# Patient Record
Sex: Female | Born: 1935 | Race: White | Hispanic: No | Marital: Married | Smoking: Never smoker
Health system: Southern US, Community
[De-identification: ages and names within clinical notes are randomized; demographics above are authoritative.]

## PROBLEM LIST (undated history)

## (undated) DIAGNOSIS — G629 Polyneuropathy, unspecified: Secondary | ICD-10-CM

## (undated) DIAGNOSIS — M1990 Unspecified osteoarthritis, unspecified site: Secondary | ICD-10-CM

## (undated) DIAGNOSIS — I340 Nonrheumatic mitral (valve) insufficiency: Secondary | ICD-10-CM

## (undated) DIAGNOSIS — K5790 Diverticulosis of intestine, part unspecified, without perforation or abscess without bleeding: Secondary | ICD-10-CM

## (undated) DIAGNOSIS — I509 Heart failure, unspecified: Secondary | ICD-10-CM

## (undated) DIAGNOSIS — I611 Nontraumatic intracerebral hemorrhage in hemisphere, cortical: Secondary | ICD-10-CM

## (undated) DIAGNOSIS — M810 Age-related osteoporosis without current pathological fracture: Secondary | ICD-10-CM

## (undated) DIAGNOSIS — I4891 Unspecified atrial fibrillation: Secondary | ICD-10-CM

## (undated) DIAGNOSIS — Q211 Atrial septal defect, unspecified: Secondary | ICD-10-CM

## (undated) HISTORY — PX: OTHER SURGICAL HISTORY: SHX169

## (undated) HISTORY — PX: BREAST CYST EXCISION: SHX579

## (undated) HISTORY — PX: SHOULDER SURGERY: SHX246

---

## 2003-11-05 ENCOUNTER — Other Ambulatory Visit: Payer: Self-pay

## 2004-05-20 ENCOUNTER — Encounter: Payer: Self-pay | Admitting: Unknown Physician Specialty

## 2004-06-15 ENCOUNTER — Encounter: Payer: Self-pay | Admitting: Unknown Physician Specialty

## 2004-10-14 ENCOUNTER — Ambulatory Visit: Payer: Self-pay | Admitting: Unknown Physician Specialty

## 2004-10-20 ENCOUNTER — Ambulatory Visit: Payer: Self-pay | Admitting: Unknown Physician Specialty

## 2005-04-27 ENCOUNTER — Ambulatory Visit: Payer: Self-pay | Admitting: Unknown Physician Specialty

## 2005-12-07 ENCOUNTER — Ambulatory Visit: Payer: Self-pay | Admitting: Unknown Physician Specialty

## 2007-01-08 ENCOUNTER — Ambulatory Visit: Payer: Self-pay | Admitting: Unknown Physician Specialty

## 2007-12-25 ENCOUNTER — Ambulatory Visit: Payer: Self-pay | Admitting: Unknown Physician Specialty

## 2008-01-10 ENCOUNTER — Ambulatory Visit: Payer: Self-pay | Admitting: Unknown Physician Specialty

## 2008-01-15 ENCOUNTER — Ambulatory Visit: Payer: Self-pay | Admitting: Unknown Physician Specialty

## 2009-11-10 ENCOUNTER — Inpatient Hospital Stay: Payer: Self-pay | Admitting: Unknown Physician Specialty

## 2009-11-13 ENCOUNTER — Encounter: Payer: Self-pay | Admitting: Internal Medicine

## 2010-12-03 ENCOUNTER — Ambulatory Visit: Payer: Self-pay | Admitting: Unknown Physician Specialty

## 2011-08-16 HISTORY — PX: HIP FRACTURE SURGERY: SHX118

## 2012-10-01 ENCOUNTER — Inpatient Hospital Stay: Payer: Self-pay | Admitting: Orthopedic Surgery

## 2012-10-01 LAB — COMPREHENSIVE METABOLIC PANEL
Alkaline Phosphatase: 64 U/L (ref 50–136)
BUN: 23 mg/dL — ABNORMAL HIGH (ref 7–18)
Bilirubin,Total: 0.4 mg/dL (ref 0.2–1.0)
Calcium, Total: 8.8 mg/dL (ref 8.5–10.1)
Chloride: 108 mmol/L — ABNORMAL HIGH (ref 98–107)
Potassium: 3.9 mmol/L (ref 3.5–5.1)
SGPT (ALT): 32 U/L (ref 12–78)
Sodium: 143 mmol/L (ref 136–145)

## 2012-10-01 LAB — CBC WITH DIFFERENTIAL/PLATELET
Basophil %: 0.6 %
Eosinophil #: 0.1 10*3/uL (ref 0.0–0.7)
Eosinophil %: 1 %
HCT: 37.2 % (ref 35.0–47.0)
HGB: 12.1 g/dL (ref 12.0–16.0)
Lymphocyte #: 0.9 10*3/uL — ABNORMAL LOW (ref 1.0–3.6)
Lymphocyte %: 12.5 %
MCHC: 32.6 g/dL (ref 32.0–36.0)
Monocyte %: 6 %
Neutrophil #: 5.8 10*3/uL (ref 1.4–6.5)
Neutrophil %: 79.9 %
Platelet: 180 10*3/uL (ref 150–440)
RBC: 3.97 10*6/uL (ref 3.80–5.20)
RDW: 14.8 % — ABNORMAL HIGH (ref 11.5–14.5)

## 2012-10-01 LAB — TSH: Thyroid Stimulating Horm: 1.92 u[IU]/mL

## 2012-10-02 LAB — BASIC METABOLIC PANEL
Anion Gap: 5 — ABNORMAL LOW (ref 7–16)
BUN: 20 mg/dL — ABNORMAL HIGH (ref 7–18)
Chloride: 108 mmol/L — ABNORMAL HIGH (ref 98–107)
Co2: 28 mmol/L (ref 21–32)
Creatinine: 0.73 mg/dL (ref 0.60–1.30)
Glucose: 112 mg/dL — ABNORMAL HIGH (ref 65–99)

## 2012-10-02 LAB — CBC WITH DIFFERENTIAL/PLATELET
Eosinophil #: 0 10*3/uL (ref 0.0–0.7)
Lymphocyte #: 1 10*3/uL (ref 1.0–3.6)
Lymphocyte %: 17 %
Monocyte #: 0.5 x10 3/mm (ref 0.2–0.9)
Monocyte %: 8.2 %
Neutrophil #: 4.3 10*3/uL (ref 1.4–6.5)
RBC: 3.31 10*6/uL — ABNORMAL LOW (ref 3.80–5.20)
WBC: 5.8 10*3/uL (ref 3.6–11.0)

## 2012-10-03 LAB — BASIC METABOLIC PANEL
Anion Gap: 6 — ABNORMAL LOW (ref 7–16)
BUN: 18 mg/dL (ref 7–18)
Calcium, Total: 8.2 mg/dL — ABNORMAL LOW (ref 8.5–10.1)
Chloride: 100 mmol/L (ref 98–107)
Creatinine: 0.89 mg/dL (ref 0.60–1.30)
EGFR (African American): >60
EGFR (Non-African Amer.): 58 — ABNORMAL LOW
Glucose: 126 mg/dL — ABNORMAL HIGH (ref 65–99)
Osmolality: 274 (ref 275–301)
Potassium: 4.3 mmol/L (ref 3.5–5.1)
Sodium: 135 mmol/L — ABNORMAL LOW (ref 136–145)

## 2012-10-03 LAB — CBC WITH DIFFERENTIAL/PLATELET
Eosinophil #: 0.2 10*3/uL (ref 0.0–0.7)
Eosinophil %: 2 %
Lymphocyte %: 11.9 %
MCH: 31.1 pg (ref 26.0–34.0)
MCHC: 33.2 g/dL (ref 32.0–36.0)
Monocyte %: 6.7 %
Neutrophil #: 6.8 10*3/uL — ABNORMAL HIGH (ref 1.4–6.5)
Neutrophil %: 79.2 %
RDW: 14.4 % (ref 11.5–14.5)
WBC: 8.6 10*3/uL (ref 3.6–11.0)

## 2012-10-04 ENCOUNTER — Encounter: Payer: Self-pay | Admitting: Internal Medicine

## 2012-10-04 LAB — HEMOGLOBIN
HGB: 7.5 g/dL — ABNORMAL LOW (ref 12.0–16.0)
HGB: 8.8 g/dL — ABNORMAL LOW (ref 12.0–16.0)

## 2012-10-05 LAB — HEMOGLOBIN: HGB: 8.6 g/dL — ABNORMAL LOW (ref 12.0–16.0)

## 2012-10-13 ENCOUNTER — Encounter: Payer: Self-pay | Admitting: Internal Medicine

## 2013-05-28 ENCOUNTER — Ambulatory Visit: Payer: Self-pay | Admitting: Ophthalmology

## 2013-06-18 ENCOUNTER — Ambulatory Visit: Payer: Self-pay | Admitting: Ophthalmology

## 2014-12-05 NOTE — Discharge Summary (Signed)
PATIENT NAME:  Cristina [name], Cristina [name] MR#:  [id number] DATE OF BIRTH:  Dec 30, 1923  DATE OF ADMISSION:  10/01/2012 DATE OF DISCHARGE:  10/05/2012  ADMITTING DIAGNOSIS: Left subtrochanteric femur fracture.   POSTOPERATIVE DIAGNOSIS: Left subtrochanteric femur fracture.   OPERATION: On 10/02/2012, the patient had ORIF of left subtrochanteric femur fracture.   ANESTHESIA: Spinal.   SURGEON: Hessie Knows, MD  IMPLANTS USED: Biomet Affixus long nail on the left, 360 x 11 mm with 105 mm lag screw.   SPECIMENS: None.   COMPLICATIONS: None.   ESTIMATED BLOOD LOSS: 100 mL. The patient was stabilized, brought to the recovery room, and then brought down to the orthopedic floor for pain control and physical therapy.   HISTORY OF PRESENT ILLNESS: The patient is an 81 year old female who presented for left hip pain after suffering a fall in her yard. The patient slipped on the ice and injured her hip. The patient was admitted to the Emergency Room where they found a hip fracture on the left, revealing subtrochanteric hip fracture. The patient was admitted to the orthopedic floor for surgery the following day.   PHYSICAL EXAMINATION: GENERAL: Alert, elderly female in no apparent distress, in traction.  HEART: Regular rate and rhythm with systolic murmur noted.  LUNGS:  Clear.  EXTREMITIES:  The patient has palpable discomfort involving the left proximal femur. The patient does have shortening with slight external rotation. The patient has neurovascular intact.   HOSPITAL COURSE: After initial admission on 10/01/2012, the patient was brought to the orthopedic floor. On 10/02/2012, the patient had surgery with an ORIF of the left femur. On postop day 1, the patient had a hemoglobin of 9.7 which dropped down to 8.8 on postop day 2 and then on postoperative day 3 it was at 7.5 where she received 1 unit of transfused blood. The patient's hemoglobin was 8.8 that night and the following morning was 8.6 and  remained stable. The patient was doing much better with physical therapy where initially she was bed to chair progressing up to ambulating around the room with therapy and some assistance. The patient was ready to go to rehab on 10/05/2012.   CONDITION AT DISCHARGE: Stable.  DISPOSITION: The patient was sent to rehab.   DISCHARGE INSTRUCTIONS: The patient will follow up with [facility] in 2 weeks for staple removal. The patient is partial weight-bear on the affected leg with using 1 to 2 pillows to raise her leg in bed. The patient will have her heels elevated off the bed and encouraged to do cough and deep breathing. The patient has a regular diet. The patient will have dressing change on a p.r.n. basis. The patient will have rehab call the clinic if there is any bright red bleeding or fever greater than 101.5 or  any calf pain or bowel or bladder difficulty. The patient will do physical therapy and occupational therapy.   DISCHARGE MEDICATIONS: 1.   Calcium carbonate 1 tablet p.o. daily.  2.   Fish oil 1 capsule daily.  3.   Centrum Silver for Women 1 capsule daily.  4.   Tylenol 325 mg 2 tablets q. 4 hours as needed for pain or fever greater than 100.5.  5.   Tramadol 50 mg q 4 to 6 hours needed for pain. 6.   Lovenox 30 mg sub-Q daily.  7.   Bisacodyl 10 mg rectally p.r.n. for constipation. 8.   Docusate calcium 240 mg daily. 9.   Multivitamin 1 tablet p.o. daily. 10.  Enteric-coated aspirin 81 mg p.o. daily, not to be utilized based on the fact that she is on Lovenox presently.  ____________________________ J. Reche Dixon, [place] jtm:sb D: 10/05/2012 08:28:13 ET    T: 10/05/2012 09:13:30 ET        JOB#: [id number] cc: J. Reche Dixon, [place], <Dictator> J Kendricks Reap [facility] PA ELECTRONICALLY SIGNED 10/10/2012 8:02

## 2014-12-05 NOTE — Op Note (Signed)
PATIENT NAME:  Cristina [name], Cristina [name] MR#:  [id number] DATE OF BIRTH:  1924/02/14  DATE OF PROCEDURE:  06/18/2013  PREOPERATIVE DIAGNOSIS: Visually significant cataract of the right eye.   POSTOPERATIVE DIAGNOSIS: Visually significant cataract of the right eye.   OPERATIVE PROCEDURE: Cataract extraction by phacoemulsification with implant of intraocular lens to right eye.   SURGEON: Birder Robson, MD   ANESTHESIA:  1. Managed anesthesia care.  2. Topical tetracaine drops followed by 2% Xylocaine jelly applied in the preoperative holding area.   COMPLICATIONS: None.   TECHNIQUE:  Stop and chop.   DESCRIPTION OF PROCEDURE: The patient was examined and consented in the preoperative holding area where the aforementioned topical anesthesia was applied to the right eye and then brought back to the Operating Room where the right eye was prepped and draped in the usual sterile ophthalmic fashion and a lid speculum was placed. A paracentesis was created with the side port blade and the anterior chamber was filled with viscoelastic. A near clear corneal incision was performed with the steel keratome. A continuous curvilinear capsulorrhexis was performed with a cystotome followed by the capsulorrhexis forceps. Hydrodissection and hydrodelineation were carried out with BSS on a blunt cannula. The lens was removed in a stop and chop technique and the remaining cortical material was removed with the irrigation-aspiration handpiece. The capsular bag was inflated with viscoelastic and the Tecnis ZCB00 21.0-diopter lens, serial number [id number] was placed in the capsular bag without complication. The remaining viscoelastic was removed from the eye with the irrigation-aspiration handpiece. The wounds were hydrated. The anterior chamber was flushed with Miostat and the eye was inflated to physiologic pressure. 0.1 mL of cefuroxime concentration 10 mg/mL was placed in the anterior chamber. The wounds were found to be  Cristina [name] tight. The eye was dressed with Vigamox. The patient was given protective glasses to wear throughout the day and a shield with which to sleep tonight. The patient was also given drops with which to begin a drop regimen today and will follow-up with me in one day.      ____________________________ Livingston Diones. Shiquita Collignon, MD wlp:cc D: 06/18/2013 21:21:15 ET T: 06/18/2013 21:46:38 ET JOB#: [id number]  cc: Paul Trettin L. Zaryah Seckel, MD, <Dictator> Livingston Diones Zayleigh Stroh MD ELECTRONICALLY SIGNED 06/21/2013 11:26

## 2014-12-05 NOTE — Consult Note (Signed)
PATIENT NAME:  Cristina [name], Cristina [name] MR#:  [id number] DATE OF BIRTH:  October 17, 1923  DATE OF CONSULTATION:  10/01/2012  REFERRING PHYSICIAN:  Dr. Rudene Christians. CONSULTING PHYSICIAN:  Gladstone Lighter, MD  PRIMARY CARE PHYSICIAN: Kem Kays, MD.  REASON FOR CONSULTATION: Preoperative clearance for left hip fracture.   BRIEF HISTORY: The patient is a very pleasant 81 year old Caucasian female with no significant past medical history other than subclinical hypothyroidism, osteoporosis and degenerative joint, who is not on any prescription medications at home. She comes in after she had a mechanical fall while walking her dog in yard this morning. She complained of left hip pain and the x-ray showed left hip fracture, so she is being admitted under ortho service and medical consult requested for preop clearance. The patient does not have any cardiac history other than history of mild mitral and tricuspid regurgitation. She does not have any chest pain,  shortness of breath or other cardiac symptoms. She is usually active at baseline.   PAST MEDICAL HISTORY:  1.  History of subclinical hypothyroidism, not on any medication.  2.  Osteoporosis.  3.  Atrial septal defect. 4.  Mild mitral and tricuspid regurgitation. 5.  Degenerative joint disease.  6.  Diverticulosis.   PAST SURGICAL HISTORY: Cyst removed from her breast and also left shoulder surgery after fracture and left eye cataract surgery.   ALLERGIES TO MEDICATIONS: She is not allergic to any medications.  HOME MEDICATIONS:   1.  Aspirin 81 mg p.o. daily.  2.  Calcium with vitamin D 500 mg/125 international units p.o. daily.  3.  Multivitamin 1 tablet p.o. daily.  4.  Fish oil capsule 1 capsule p.o. daily.    SOCIAL HISTORY: Lives at home with her husband. No history of any smoking or alcohol use.   FAMILY HISTORY: Mom died from throat cancer in her late 66s and father lived up to last 3s as well and had heart disease.   REVIEW OF SYSTEMS:    CONSTITUTIONAL: No fever, fatigue or weakness.  EYES: No blurry vision, double vision, glaucoma or history of cataracts.  ENT: No tinnitus, ear pain, hearing loss, epistaxis or discharge.  RESPIRATORY: No cough, wheeze, hemoptysis or COPD. In fact, she recovered from a recent sinus infection about 2 weeks ago.  CARDIOVASCULAR: No chest pain, orthopnea, edema, arrhythmia, palpitations or syncope.  GASTROINTESTINAL: No nausea, vomiting, diarrhea, abdominal pain, hematemesis or melena.  GENITOURINARY: No dysuria, hematuria, renal calculus, frequency or incontinence.  ENDOCRINE: No polyuria, nocturia, thyroid problems or heat or cold intolerance.  HEMATOLOGY: No anemia, easy bruising or bleeding.  SKIN: No acne, rash or lesions.  MUSCULOSKELETAL: Positive for left hip pain at this time and also history of degenerative joint disease and arthritis.  NEUROLOGIC: No numbness, weakness, CVA, TIA or seizures.  PSYCHOLOGICAL: No anxiety, insomnia or depression.   PHYSICAL EXAMINATION:  VITAL SIGNS: Temperature 98.1. seven degrees Fahrenheit, pulse 83, respirations 16, blood pressure 174/71 and pulse ox 92% on room air.  GENERAL: Well built, well nourished female lying in bed, not in any acute distress.  HEENT: Normocephalic, atraumatic. Pupils equal, round, reacting to light. Anicteric sclerae. Extraocular movements intact. Oropharynx clear without erythema, mass or exudates.  NECK: Supple. No thyromegaly, JVD, or carotid bruits. No lymphadenopathy.  LUNGS: Moving air bilaterally. No wheeze or crackles. No use of accessory muscles for breathing.  CARDIOVASCULAR: S1, S2 regular rate and rhythm. A 2/6 systolic murmurheard over precordial araea. No rubs or gallops.  ABDOMEN: Soft, nontender, nondistended. No hepatosplenomegaly.  Normal bowel sounds.  EXTREMITIES: Right lower extremity is normal. No pedal edema, 2+ dorsalis pedis pulses palpable. Left lower extremity is shortened, internally rotated and  abducted.   LYMPHATICS: No cervical or inguinal lymphadenopathy.  NEUROLOGIC: Cranial nerves intact. No focal motor or sensory deficit.  PSYCHOLOGICALLY: The patient is awake, alert, oriented x 3.   LABORATORY DATA: Labs are pending at this time.   RECOMMENDATIONS: An 81 year old female relatively healthy with past medical history of degenerative joint disease, osteoporosis and subclinical hypothyroidism, not on any medications. She was admitted after a mechanical fall and left hip fracture. Medical consult is requested for preop evaluation  1.  Preop evaluation for left hip surgery with no known cardiac disease and active at baseline. No  chest pain or other cardiac symptoms. Labs and EKG are pending. If normal, okay to proceed with surgery. She is low to intermediate risk at this time.  2.  Osteoporosis and degenerative joint disease. Continue calcium and vitamin D supplements.  3.  Left hip fracture. Management process surgery likely tomorrow. Continue pain medications and postoperative deep vein thrombosis prophylaxis and physical therapy consult.  4.  Subclinical hypothyroidism, not on any medications at home. Check TSH.  5.  CODE STATUS: Full code.   TIME SPENT IN CONSULTATION: 50 minutes.    ____________________________ Gladstone Lighter, MD rk:aw D: 10/01/2012 19:18:45 ET T: 10/02/2012 07:15:48 ET JOB#: [id number]  cc: Gladstone Lighter, MD, <Dictator> Lorenza Evangelist, MD Laurene Footman, MD Gladstone Lighter MD ELECTRONICALLY SIGNED 10/11/2012 15:29

## 2014-12-05 NOTE — H&P (Signed)
PATIENT NAME:  Cristina [name], Cristina [name] MR#:  [id number] DATE OF BIRTH:  [birth year]  DATE OF ADMISSION:  10/01/2012  CHIEF COMPLAINT:  Left hip pain.   HISTORY OF PRESENT ILLNESS:  The patient is an 81 year old female who suffered a fall in her yard this evening. The ground was soggy and she slipped injuring her left hip. She is unable to bear weight and was brought to the Emergency Room where she was found to have a hip fracture. She is being admitted for treatment of this. She has been previously ambulatory without assistive devices. She lives at home with her husband and is able to care for her home. She has been generally good health.   PAST MEDICAL HISTORY:  Remarkable for hypertension, high cholesterol, hyperthyroidism, osteoporosis, atrial septal defect, mild mitral and tricuspid regurgitation, diverticulosis, recent bronchitis, chronic labyrinthitis.   PAST SURGICAL HISTORY:  ORIF of the left shoulder in March 2011 with good results and prior cyst removal from her breast.   MEDICATIONS ON ADMISSION:  Aspirin 81 mg daily, calcium with vitamin D daily, fish oil 1 daily and Centrum Silver daily.   SOCIAL HISTORY:  She is a nonsmoker, nondrinker and lives at home with her husband.   ALLERGIES:  She has no known drug allergies.   FAMILY HISTORY:  Positive for leukemia and thyroid cancer.   REVIEW OF SYSTEMS:  Positive for left hip pain. She denies any other associated injuries. She denies dizziness or passing out. She does have some diminished sensation to the lower extremities with recent EMG nerve conduction test showing peripheral neuropathy.   PHYSICAL EXAMINATION: GENERAL: Well-developed, well-nourished white female who appears younger than her stated age in mild distress secondary to hip pain.  HEENT: Remarkable for partial dentures.  NECK: Nontender.  LUNGS: Clear.  HEART: Regular rate and rhythm with systolic murmur noted.  EXTREMITIES: She does have palpable pulses in both lower  extremities. She is able to flex and extend the toes. The left leg is shortened.   DIAGNOSTIC DATA:  X-rays were reviewed with her and her family showing reverse obliquity fracture in the subtrochanteric area with significant displacement.   CLINICAL IMPRESSION:  Left proximal femur fracture.   PLAN:  I discussed operative treatment to get her ambulatory and try to get her back close to her prior level of activity. The risks, benefits and possible complications of surgery were discussed. Perioperative antibiotics and anticoagulation were also discussed, potential need for transfusion postoperatively and blood transfusion consents signed. We will plan on surgery tomorrow with preoperative medical evaluation.    ____________________________ Laurene Footman, MD mjm:si D: 10/01/2012 21:16:34 ET T: 10/01/2012 23:15:40 ET JOB#: [id number]  cc: Laurene Footman, MD, <Dictator> Laurene Footman MD ELECTRONICALLY SIGNED 10/02/2012 7:33

## 2014-12-05 NOTE — Consult Note (Signed)
Brief Consult Note: Diagnosis: Left hip fracture after mechanical fall, Osteoporosis and degenerative joint disease,.   Patient was seen by consultant.   Consult note dictated.   Recommend to proceed with surgery or procedure.   Orders entered.   Comments: 81y/o relatively healthy female with PMH of DJD, osteoporosis and subclinical hypothyroidism, not on any meds at home admitted after a mechanical fall and left hip fracture. Medical consult requested for pre op eval  * Pre op eval for left hip surgery- no known cardiac disease  other than mild mitral regurg, active at baseline, no chest pain or other cardiac symnptoms. labs and EKG pending if normal- ok to proceed with surgery. low to intermediate risk  * Osteoporosis and DJD- calcium Vitd supplements as outpt  * Left hip fracture- mgmt per ortho, surgery likely tomorrow pain meds, post op DVT prophylaxis, PT consult.  Electronic Signatures: Gladstone Lighter (MD)  (Signed 17-Feb-14 19:03)  Authored: Brief Consult Note   Last Updated: 17-Feb-14 19:03 by Gladstone Lighter (MD)

## 2014-12-05 NOTE — Op Note (Signed)
PATIENT NAME:  Cristina [name], GOUGE MR#:  [id number] DATE OF BIRTH:  13-Nov-1923  DATE OF PROCEDURE:  10/02/2012  PREOPERATIVE DIAGNOSIS:  Left subtrochanteric femur fracture.   POSTOPERATIVE DIAGNOSIS:  Left subtrochanteric femur fracture.  PROCEDURE:  Open reduction and internal fixation left subtrochanteric femur fracture.   ANESTHESIA:  Spinal.   SURGEON:  Laurene Footman, MD   DESCRIPTION OF PROCEDURE:  The patient was brought to the operating room and after adequate anesthesia was obtained with spinal.  The patient was transferred to the fracture table, right leg in the well-leg holder, right foot in the traction boot. Traction was applied with neutral rotation and acceptable initial alignment was obtained. The hip was then prepped and draped using the barrier drape method. Appropriate patient identification and timeout procedures were completed. A small incision was made proximally and a guidewire inserted into the center of the trochanter and proximal reaming then carried out. The guidewire was then inserted down the canal and measurements made for rod length, 360 being chosen. Reaming was carried out to 13 mm and an 11 x 360 rod was inserted. Prior to passing the wire, an incision was made laterally and the reduction clamp was used to get fairly good reduction at the fracture site. This was left in place for the reaming and then rod was placed. The clamp was removed and there is no loss of alignment. When the rod was placed to the appropriate depth and rotation, guidewire inserted in a center/center position was obtained measured 105 mm and reaming carried out followed by placement of the lag screw. This was locked down, as it did not need to be a sliding screw. At this point permanent C-arm views were obtained distally. The rod was at the appropriate length. The leg was slightly adducted and a C-arm was brought in to get perfect laterals. Through small stab incisions, 2 drill holes were made  followed by placing the 5.0 cortical screws, 40 and 36 mm in length. Checking the AP, they were appropriate length and position. The wounds were then thoroughly irrigated. The deep fascia was repaired using an 0 Vicryl, 2-0 Vicryl subcutaneously and skin staples. Xeroform, 4 x 4's, ABD and tape applied.   ESTIMATED BLOOD LOSS:  123XX123 mL  COMPLICATIONS:  None.   SPECIMEN:  None.  IMPLANT:  Biomet affixes long nail left 360 x 11 mm with 105 mm lag screw.   ____________________________ Laurene Footman, MD mjm:ce D: 10/02/2012 13:35:19 ET T: 10/02/2012 13:46:44 ET JOB#: [id number]  cc: Laurene Footman, MD, <Dictator> Laurene Footman MD ELECTRONICALLY SIGNED 10/02/2012 15:23

## 2015-07-06 ENCOUNTER — Encounter: Payer: Self-pay | Admitting: Emergency Medicine

## 2015-07-06 ENCOUNTER — Emergency Department: Payer: Medicare Other

## 2015-07-06 ENCOUNTER — Emergency Department
Admission: EM | Admit: 2015-07-06 | Discharge: 2015-07-06 | Disposition: A | Discharge status: Other Acute Inpatient Hospital | Payer: Medicare Other | Attending: Emergency Medicine | Admitting: Emergency Medicine

## 2015-07-06 DIAGNOSIS — Z79899 Other long term (current) drug therapy: Secondary | ICD-10-CM | POA: Diagnosis not present

## 2015-07-06 DIAGNOSIS — S0101XA Laceration without foreign body of scalp, initial encounter: Secondary | ICD-10-CM | POA: Insufficient documentation

## 2015-07-06 DIAGNOSIS — I4891 Unspecified atrial fibrillation: Secondary | ICD-10-CM

## 2015-07-06 DIAGNOSIS — S0990XA Unspecified injury of head, initial encounter: Secondary | ICD-10-CM | POA: Diagnosis not present

## 2015-07-06 DIAGNOSIS — Y92009 Unspecified place in unspecified non-institutional (private) residence as the place of occurrence of the external cause: Secondary | ICD-10-CM | POA: Insufficient documentation

## 2015-07-06 DIAGNOSIS — S4991XA Unspecified injury of right shoulder and upper arm, initial encounter: Secondary | ICD-10-CM | POA: Insufficient documentation

## 2015-07-06 DIAGNOSIS — W19XXXA Unspecified fall, initial encounter: Secondary | ICD-10-CM

## 2015-07-06 DIAGNOSIS — S61412A Laceration without foreign body of left hand, initial encounter: Secondary | ICD-10-CM | POA: Insufficient documentation

## 2015-07-06 DIAGNOSIS — W108XXA Fall (on) (from) other stairs and steps, initial encounter: Secondary | ICD-10-CM | POA: Insufficient documentation

## 2015-07-06 DIAGNOSIS — Y9301 Activity, walking, marching and hiking: Secondary | ICD-10-CM | POA: Diagnosis not present

## 2015-07-06 DIAGNOSIS — S0181XA Laceration without foreign body of other part of head, initial encounter: Secondary | ICD-10-CM | POA: Diagnosis not present

## 2015-07-06 DIAGNOSIS — Y998 Other external cause status: Secondary | ICD-10-CM | POA: Diagnosis not present

## 2015-07-06 HISTORY — DX: Polyneuropathy, unspecified: G62.9

## 2015-07-06 LAB — BASIC METABOLIC PANEL
Anion gap: 9 (ref 5–15)
BUN: 17 mg/dL (ref 6–20)
CALCIUM: 8.7 mg/dL — AB (ref 8.9–10.3)
CO2: 26 mmol/L (ref 22–32)
CREATININE: 0.93 mg/dL (ref 0.44–1.00)
Chloride: 101 mmol/L (ref 101–111)
GFR, EST NON AFRICAN AMERICAN: 53 mL/min — AB (ref >60)
Glucose, Bld: 117 mg/dL — ABNORMAL HIGH (ref 65–99)
Potassium: 3.7 mmol/L (ref 3.5–5.1)
SODIUM: 136 mmol/L (ref 135–145)

## 2015-07-06 LAB — CBC WITH DIFFERENTIAL/PLATELET
BASOS PCT: 0 %
Basophils Absolute: 0 10*3/uL (ref 0–0.1)
EOS ABS: 0 10*3/uL (ref 0–0.7)
Eosinophils Relative: 0 %
HCT: 38.1 % (ref 35.0–47.0)
HEMOGLOBIN: 12.4 g/dL (ref 12.0–16.0)
Lymphocytes Relative: 9 %
Lymphs Abs: 0.5 10*3/uL — ABNORMAL LOW (ref 1.0–3.6)
MCH: 30.5 pg (ref 26.0–34.0)
MCHC: 32.6 g/dL (ref 32.0–36.0)
MCV: 93.4 fL (ref 80.0–100.0)
MONOS PCT: 7 %
Monocytes Absolute: 0.4 10*3/uL (ref 0.2–0.9)
NEUTROS PCT: 84 %
Neutro Abs: 5 10*3/uL (ref 1.4–6.5)
Platelets: 167 10*3/uL (ref 150–440)
RBC: 4.08 MIL/uL (ref 3.80–5.20)
RDW: 15.1 % — AB (ref 11.5–14.5)
WBC: 5.9 10*3/uL (ref 3.6–11.0)

## 2015-07-06 LAB — URINALYSIS COMPLETE WITH MICROSCOPIC (ARMC ONLY)
BILIRUBIN URINE: NEGATIVE
Bacteria, UA: NONE SEEN
GLUCOSE, UA: NEGATIVE mg/dL
Hgb urine dipstick: NEGATIVE
Leukocytes, UA: NEGATIVE
Nitrite: NEGATIVE
Protein, ur: 100 mg/dL — AB
Specific Gravity, Urine: 1.019 (ref 1.005–1.030)
pH: 6 (ref 5.0–8.0)

## 2015-07-06 LAB — PROTIME-INR
INR: 1.04
Prothrombin Time: 13.8 seconds (ref 11.4–15.0)

## 2015-07-06 LAB — TSH: TSH: 0.865 u[IU]/mL (ref 0.350–4.500)

## 2015-07-06 LAB — TROPONIN I

## 2015-07-06 MED ORDER — DILTIAZEM LOAD VIA INFUSION
20.0000 mg | Freq: Once | INTRAVENOUS | Status: AC
  Administered 2015-07-06: 20 mg via INTRAVENOUS
  Filled 2015-07-06: qty 20

## 2015-07-06 MED ORDER — DILTIAZEM HCL 100 MG IV SOLR
5.0000 mg/h | INTRAVENOUS | Status: DC
  Administered 2015-07-06: 5 mg/h via INTRAVENOUS
  Filled 2015-07-06: qty 100

## 2015-07-06 MED ORDER — LIDOCAINE HCL (PF) 1 % IJ SOLN
INTRAMUSCULAR | Status: AC
  Administered 2015-07-06: 10 mL
  Filled 2015-07-06: qty 10

## 2015-07-06 NOTE — ED Provider Notes (Signed)
[facility] Emergency Department Provider Note  ____________________________________________  Time seen:  [id number]  I have reviewed the triage vital signs and the nursing notes.  History by: Primarily from patient but also EMS/paramedic.  HISTORY  Chief Complaint Fall  lacerations to head.    HPI Cristina [name] is a 81 y.o. female who is usually active and independent. She was walking her dog just prior to having the accident prior to her arrival. She was going back in the house, with her dog going down about 5 steps to the basement door. The patient tripped and fell approximately 3 or 4 steps forward. She hit her head, though she does not know on what, and sustained a laceration on either side of her forehead as well as one on her left hand. She denies any headache, she denies any neck pain, and she reports she has full range of motion except for her right shoulder, which is a chronic problem. She denies any loss of consciousness. She is alert and communicative and appropriate at this time.  EMS was called. Upon their evaluation, they found the patient was in atrial fibrillation with rapid ventricular response. The patient denies having had A. fib before. She has no known cardiac disease. She denies hypertension or diabetes. She does take 1 aspirin a day. She does not take any anticoagulants.    Past Medical History  Diagnosis Date  . Neuropathy ([place])     There are no active problems to display for this patient.   Past Surgical History  Procedure Laterality Date  . Hip fracture surgery Left 2013    Current Outpatient Rx  Name  Route  Sig  Dispense  Refill  . GABAPENTIN, ONCE-DAILY, PO   Oral   Take by mouth.           Allergies Review of patient's allergies indicates not on file.  History reviewed. No pertinent family history.  Social History Social History  Substance Use Topics  . Smoking status: Never Smoker   . Smokeless tobacco: None   . Alcohol Use: No    Review of Systems  Constitutional: Negative for fever/chills. ENT: Notable for lacerations on the forehead bilaterally.. Cardiovascular: Negative for chest pain. Noted asymptomatic A. fib currently. See history of present illness  Respiratory: Negative for cough. Gastrointestinal: Negative for abdominal pain, vomiting and diarrhea. Genitourinary: Negative for dysuria. Musculoskeletal: No myalgias or injuries. Skin: Fall with lacerations. See history of present illness Neurological: Negative for headache or focal weakness   10-point ROS otherwise negative.  ____________________________________________   PHYSICAL EXAM:  VITAL SIGNS: ED Triage Vitals  Enc Vitals Group     BP --      Pulse --      Resp --      Temp --      Temp src --      SpO2 --      Weight --      Height --      Head Cir --      Peak Flow --      Pain Score --      Pain Loc --      Pain Edu? --      Excl. in [place]? --     Constitutional:  Alert and oriented. There is a moderate amount of blood on her white sweater. She has a dressing wrapped around her head as well as one on her left wrist. She is in no acute distress.Marland Kitchen ENT  Head: Normocephalic and atraumatic.   Nose: No congestion/rhinnorhea.       Mouth: No erythema, no swelling   Cervical: No cervical tenderness. Normal spontaneous movements. Cardiovascular: Tachycardic at 120, irregular. Respiratory:  Normal respiratory effort, no tachypnea.    Breath sounds are clear and equal bilaterally.  Gastrointestinal: Soft and nontender. No distention.  Back: No muscle spasm, no tenderness, no CVA tenderness. Good range of motion except mild restriction of range of motion of her right shoulder due to discomfort. No deformity. Patient reports this is chronic. Musculoskeletal: No deformity noted.  No noted edema. Neurologic:  Communicative. Normal appearing spontaneous movement in all 4 extremities. No gross focal neurologic  deficits are appreciated.  Skin:  There is a laceration on either side of the forehead. The right side has a 1/2 cm linear laceration. The left side has a 3 cm linear laceration. Bleeding is controlled in both. There is also a laceration on the medial side of her right hand. This is a bit of an oblique flap. The edges of the flap appear cyanotic and may not be viable. The with of this band of questionable skin tissue is approximately 3 mm.  Psychiatric: Mood and affect are normal. Speech and behavior are normal.  ____________________________________________    LABS (pertinent positives/negatives)  Labs Reviewed  BASIC METABOLIC PANEL - Abnormal; Notable for the following:    Glucose, Bld 117 (*)    Calcium 8.7 (*)    GFR calc non Af Amer 53 (*)    All other components within normal limits  CBC WITH DIFFERENTIAL/PLATELET - Abnormal; Notable for the following:    RDW 15.1 (*)    Lymphs Abs 0.5 (*)    All other components within normal limits  TROPONIN I  TSH  PROTIME-INR  URINALYSIS COMPLETEWITH MICROSCOPIC ([facility] ONLY)     ____________________________________________   EKG  ED ECG REPORT I, Jasmarie Coppock W, the attending physician, personally viewed and interpreted this ECG.   Date: 07/06/2015  EKG Time: 1641   Rate: 117  Rhythm:Atrial fibrillation   with rapid ventricular rate   Axis: Normal  Intervals: Normal  ST&T Change: None   ____________________________________________    RADIOLOGY  Chest x-ray:  FINDINGS: Cardiomegaly. Hyperinflation of the lungs. Linear scarring in the lung bases. No effusions or confluent airspace opacities. No pneumothorax. No acute bony abnormality.  IMPRESSION: Hyperinflation, cardiomegaly. No active disease  CT head:  FINDINGS: CT HEAD FINDINGS  In the left frontal lobe subcortical white matter there is a focus of high attenuation measuring approximately 13 x 10 mm, concerning for a small hemorrhagic contusion (image 21 of  series 2). Mild cerebral atrophy. Patchy and confluent areas of decreased attenuation are noted throughout the deep and periventricular white matter of the cerebral hemispheres bilaterally, compatible with chronic microvascular ischemic disease. Physiologic calcifications in the basal ganglia bilaterally. No hydrocephalus. No focal mass or mass effect. No acute displaced skull fracture. Visualized paranasal sinuses and mastoids are well pneumatized.  CT CERVICAL SPINE FINDINGS  No acute displaced fractures of the cervical spine. Mild straightening of the cervical spine from C3 through C6, likely chronic. Alignment is otherwise anatomic. Prevertebral soft tissues are normal. Multilevel degenerative disc disease, most severe at C3-C4, C4-C5 and C5-C6. Mild multilevel facet arthropathy. Visualized portions of the upper thorax are remarkable for some bilateral apical pleural parenchymal thickening, most compatible with chronic post infectious or inflammatory scarring.  IMPRESSION: 1. Small hemorrhagic contusion in the left frontal subcortical white matter. 2. Mild cerebral  atrophy with chronic microvascular ischemic changes in the cerebral white matter, as above. 3. No evidence of significant acute traumatic injury to the cervical spine. 4. Multilevel degenerative disc disease and cervical spondylosis, as above. Critical Value/emergent results were called by telephone at the time of interpretation on 07/06/2015 at 4:30 pm to Dr. Francene Castle, who verbally acknowledged these results.  ____________________________________________   PROCEDURES  LACERATION REPAIR Performed by: Ahmed Prima Authorized by: Ahmed Prima Consent: Verbal consent obtained. Risks and benefits: risks, benefits and alternatives were discussed Consent given by: patient Patient identity confirmed: provided demographic data Prepped and Draped in normal sterile fashion Wound explored  Laceration  Location: right forehead   Laceration Le1.5 cm  No Foreign Bodies seen or palpated  Anesthesia: local infiltration Local anesthetic: lidocaine 1%  Anesthetic total: 1 ml  Irrigation method: syringe Amount of cleaning: standard  Skin closure: Ethilon 5-0   Number of sutures: 2    Technique: Simple sutures  Patient tolerance: Patient tolerated the procedure well with no immediate complications.  ~~~~~~~~~~~~~~~~~~~~~~~  Laceration Location: right forehead   Laceration Le1.5 cm  No Foreign Bodies seen or palpated  Anesthesia: local infiltration Local anesthetic: lidocaine 1%  Anesthetic total: 1 ml  Irrigation method: syringe Amount of cleaning: standard  Skin closure: Ethilon 5-0   Number of sutures: 4    Technique: Simple sutures  Patient tolerance: Patient tolerated the procedure well with no immediate complications.  ~~~~~~~~~~~~~~~~~~~  Laceration Location: Left hand, medial  Laceration Length: 1.5 cm open, irregular. High  No Foreign Bodies seen or palpated  Irrigation method: syringe Amount of cleaning: standard  Skin closure: Steri-Strips  Technique: I felt it best not to suture this wound closed. With the skin edges having questionable viability, I felt it best to close the wound with Steri-Strips and give the skin edges and the wound a chance to revascularize and heel. If this is not successful, the leading skin edge may weather. I've explained this to the patient.  Patient tolerance: Patient tolerated the procedure well with no immediate complications.   CRITICAL CARE Performed by: Ahmed Prima   Total critical care time: 35 minutes due to the Dictation of a intracranial hemorrhage status post fall, discussions with [facility] for transfer, discussions with family.  Critical care time was exclusive of separately billable procedures and treating other patients.  Critical care was necessary to treat or prevent imminent or life-threatening  deterioration.  Critical care was time spent personally by me on the following activities: development of treatment plan with patient and/or surrogate as well as nursing, discussions with consultants, evaluation of patient's response to treatment, examination of patient, obtaining history from patient or surrogate, ordering and performing treatments and interventions, ordering and review of laboratory studies, ordering and review of radiographic studies, pulse oximetry and re-evaluation of patient's condition.    ____________________________________________   INITIAL IMPRESSION / ASSESSMENT AND PLAN / ED COURSE  Pertinent labs & imaging results that were available during my care of the patient were reviewed by me and considered in my medical decision making (see chart for details).  For a pleasant alert patient in no acute distress with 3 lacerations and a mild scalp contusion. My suspicion for intracranial injury is low, however given the fact that she now has new onset atrial fibrillation and may require some anticoagulation, I feel it best to get the head CT to rule out a small bleed at this time.  We will provide the patient with diltiazem to attempt rate  control. She is in no acute distress.  ----------------------------------------- 5:19 PM on 07/06/2015 -----------------------------------------  I have closed the 3 lacerations noted above. The patient continues to be very pleasant and alert and in no acute distress. She does have hematomas forming in the area of her lacerations, specifically the left frontal.  Her family has arrived. The patient's son and I know each other. They're very pleasant and understanding the situation.  I discuss transfer with the patient and her family. The patient prefers to [facility] if possible. If [facility] is not able to accept transfer, we will speak with [facility].  ----------------------------------------- 5:57 PM on  07/06/2015 -----------------------------------------  I discussed the patient's situation with [facility] air care and Dr. Thera Flake of the [facility] emergency department. The patient is accepted in transfer. We will wait for ground transportation.   ____________________________________________   FINAL CLINICAL IMPRESSION(S) / ED DIAGNOSES  Final diagnoses:  New onset atrial fibrillation ([place])  Laceration of left hand, initial encounter  Scalp laceration, initial encounter      Ahmed Prima, MD 07/06/15 2334

## 2015-07-06 NOTE — ED Notes (Signed)
Brought in by EMS from home for trip and fall down 3 steps. Contusion to head and left hand.

## 2015-11-03 ENCOUNTER — Inpatient Hospital Stay: Payer: Medicare Other

## 2015-11-03 ENCOUNTER — Encounter: Payer: Self-pay | Admitting: Intensive Care

## 2015-11-03 ENCOUNTER — Inpatient Hospital Stay
Admission: EM | Admit: 2015-11-03 | Discharge: 2015-11-06 | DRG: 064 | Disposition: A | Discharge status: Skilled Nursing Facility | Payer: Medicare Other | Attending: Internal Medicine | Admitting: Internal Medicine

## 2015-11-03 ENCOUNTER — Emergency Department: Payer: Medicare Other

## 2015-11-03 DIAGNOSIS — I6340 Cerebral infarction due to embolism of unspecified cerebral artery: Principal | ICD-10-CM | POA: Diagnosis present

## 2015-11-03 DIAGNOSIS — M1990 Unspecified osteoarthritis, unspecified site: Secondary | ICD-10-CM | POA: Diagnosis present

## 2015-11-03 DIAGNOSIS — I248 Other forms of acute ischemic heart disease: Secondary | ICD-10-CM | POA: Diagnosis present

## 2015-11-03 DIAGNOSIS — I639 Cerebral infarction, unspecified: Secondary | ICD-10-CM | POA: Diagnosis present

## 2015-11-03 DIAGNOSIS — Z9181 History of falling: Secondary | ICD-10-CM | POA: Diagnosis not present

## 2015-11-03 DIAGNOSIS — M810 Age-related osteoporosis without current pathological fracture: Secondary | ICD-10-CM | POA: Diagnosis present

## 2015-11-03 DIAGNOSIS — G8191 Hemiplegia, unspecified affecting right dominant side: Secondary | ICD-10-CM | POA: Diagnosis present

## 2015-11-03 DIAGNOSIS — I130 Hypertensive heart and chronic kidney disease with heart failure and stage 1 through stage 4 chronic kidney disease, or unspecified chronic kidney disease: Secondary | ICD-10-CM | POA: Diagnosis present

## 2015-11-03 DIAGNOSIS — I272 Other secondary pulmonary hypertension: Secondary | ICD-10-CM | POA: Diagnosis present

## 2015-11-03 DIAGNOSIS — Z79899 Other long term (current) drug therapy: Secondary | ICD-10-CM

## 2015-11-03 DIAGNOSIS — R4701 Aphasia: Secondary | ICD-10-CM | POA: Diagnosis present

## 2015-11-03 DIAGNOSIS — Z7982 Long term (current) use of aspirin: Secondary | ICD-10-CM

## 2015-11-03 DIAGNOSIS — E785 Hyperlipidemia, unspecified: Secondary | ICD-10-CM | POA: Diagnosis present

## 2015-11-03 DIAGNOSIS — T502X5A Adverse effect of carbonic-anhydrase inhibitors, benzothiadiazides and other diuretics, initial encounter: Secondary | ICD-10-CM | POA: Diagnosis present

## 2015-11-03 DIAGNOSIS — Q211 Atrial septal defect: Secondary | ICD-10-CM

## 2015-11-03 DIAGNOSIS — N170 Acute kidney failure with tubular necrosis: Secondary | ICD-10-CM | POA: Diagnosis present

## 2015-11-03 DIAGNOSIS — K5790 Diverticulosis of intestine, part unspecified, without perforation or abscess without bleeding: Secondary | ICD-10-CM | POA: Diagnosis present

## 2015-11-03 DIAGNOSIS — I081 Rheumatic disorders of both mitral and tricuspid valves: Secondary | ICD-10-CM | POA: Diagnosis present

## 2015-11-03 DIAGNOSIS — I452 Bifascicular block: Secondary | ICD-10-CM | POA: Diagnosis present

## 2015-11-03 DIAGNOSIS — I4891 Unspecified atrial fibrillation: Secondary | ICD-10-CM | POA: Diagnosis present

## 2015-11-03 DIAGNOSIS — J9811 Atelectasis: Secondary | ICD-10-CM | POA: Diagnosis present

## 2015-11-03 DIAGNOSIS — G629 Polyneuropathy, unspecified: Secondary | ICD-10-CM | POA: Diagnosis present

## 2015-11-03 DIAGNOSIS — S0990XA Unspecified injury of head, initial encounter: Secondary | ICD-10-CM | POA: Diagnosis present

## 2015-11-03 DIAGNOSIS — R1310 Dysphagia, unspecified: Secondary | ICD-10-CM | POA: Diagnosis present

## 2015-11-03 DIAGNOSIS — I5032 Chronic diastolic (congestive) heart failure: Secondary | ICD-10-CM | POA: Diagnosis present

## 2015-11-03 DIAGNOSIS — N183 Chronic kidney disease, stage 3 (moderate): Secondary | ICD-10-CM | POA: Diagnosis present

## 2015-11-03 HISTORY — DX: Nontraumatic intracerebral hemorrhage in hemisphere, cortical: I61.1

## 2015-11-03 HISTORY — DX: Nonrheumatic mitral (valve) insufficiency: I34.0

## 2015-11-03 HISTORY — DX: Heart failure, unspecified: I50.9

## 2015-11-03 HISTORY — DX: Diverticulosis of intestine, part unspecified, without perforation or abscess without bleeding: K57.90

## 2015-11-03 HISTORY — DX: Unspecified atrial fibrillation: I48.91

## 2015-11-03 HISTORY — DX: Atrial septal defect, unspecified: Q21.10

## 2015-11-03 HISTORY — DX: Age-related osteoporosis without current pathological fracture: M81.0

## 2015-11-03 HISTORY — DX: Atrial septal defect: Q21.1

## 2015-11-03 HISTORY — DX: Unspecified osteoarthritis, unspecified site: M19.90

## 2015-11-03 LAB — CBC WITH DIFFERENTIAL/PLATELET
BASOS PCT: 1 %
Basophils Absolute: 0 10*3/uL (ref 0–0.1)
EOS ABS: 0.1 10*3/uL (ref 0–0.7)
EOS PCT: 2 %
HEMATOCRIT: 40 % (ref 35.0–47.0)
HEMOGLOBIN: 13.1 g/dL (ref 12.0–16.0)
LYMPHS ABS: 0.9 10*3/uL — AB (ref 1.0–3.6)
Lymphocytes Relative: 20 %
MCH: 29.3 pg (ref 26.0–34.0)
MCHC: 32.7 g/dL (ref 32.0–36.0)
MCV: 89.4 fL (ref 80.0–100.0)
Monocytes Absolute: 0.6 10*3/uL (ref 0.2–0.9)
Monocytes Relative: 13 %
Neutro Abs: 2.7 10*3/uL (ref 1.4–6.5)
Neutrophils Relative %: 64 %
Platelets: 170 10*3/uL (ref 150–440)
RBC: 4.47 MIL/uL (ref 3.80–5.20)
RDW: 16.3 % — AB (ref 11.5–14.5)
WBC: 4.2 10*3/uL (ref 3.6–11.0)

## 2015-11-03 LAB — COMPREHENSIVE METABOLIC PANEL
ALBUMIN: 3.7 g/dL (ref 3.5–5.0)
ALK PHOS: 64 U/L (ref 38–126)
ALT: 19 U/L (ref 14–54)
ANION GAP: 7 (ref 5–15)
AST: 45 U/L — ABNORMAL HIGH (ref 15–41)
BUN: 33 mg/dL — ABNORMAL HIGH (ref 6–20)
CALCIUM: 9.1 mg/dL (ref 8.9–10.3)
CHLORIDE: 101 mmol/L (ref 101–111)
CO2: 33 mmol/L — AB (ref 22–32)
Creatinine, Ser: 1.4 mg/dL — ABNORMAL HIGH (ref 0.44–1.00)
GFR calc non Af Amer: 32 mL/min — ABNORMAL LOW (ref >60)
GFR, EST AFRICAN AMERICAN: 37 mL/min — AB (ref >60)
GLUCOSE: 105 mg/dL — AB (ref 65–99)
POTASSIUM: 4.5 mmol/L (ref 3.5–5.1)
SODIUM: 141 mmol/L (ref 135–145)
Total Bilirubin: 0.6 mg/dL (ref 0.3–1.2)
Total Protein: 6.8 g/dL (ref 6.5–8.1)

## 2015-11-03 LAB — URINALYSIS COMPLETE WITH MICROSCOPIC (ARMC ONLY)
BACTERIA UA: NONE SEEN
Bilirubin Urine: NEGATIVE
Glucose, UA: NEGATIVE mg/dL
Hgb urine dipstick: NEGATIVE
KETONES UR: NEGATIVE mg/dL
LEUKOCYTES UA: NEGATIVE
Nitrite: NEGATIVE
PH: 6 (ref 5.0–8.0)
PROTEIN: NEGATIVE mg/dL
SPECIFIC GRAVITY, URINE: 1.011 (ref 1.005–1.030)
WBC UA: NONE SEEN WBC/hpf (ref 0–5)

## 2015-11-03 LAB — TROPONIN I
TROPONIN I: 0.24 ng/mL — AB (ref <0.031)
Troponin I: 0.24 ng/mL — ABNORMAL HIGH (ref <0.031)

## 2015-11-03 MED ORDER — ASPIRIN 300 MG RE SUPP
300.0000 mg | Freq: Every day | RECTAL | Status: DC
  Administered 2015-11-03 – 2015-11-04 (×2): 300 mg via RECTAL
  Filled 2015-11-03 (×2): qty 1

## 2015-11-03 MED ORDER — SODIUM CHLORIDE 0.9% FLUSH
3.0000 mL | Freq: Two times a day (BID) | INTRAVENOUS | Status: DC
  Administered 2015-11-04 – 2015-11-06 (×4): 3 mL via INTRAVENOUS

## 2015-11-03 MED ORDER — ENOXAPARIN SODIUM 40 MG/0.4ML ~~LOC~~ SOLN
40.0000 mg | SUBCUTANEOUS | Status: DC
  Administered 2015-11-03: 40 mg via SUBCUTANEOUS
  Filled 2015-11-03: qty 0.4

## 2015-11-03 MED ORDER — SODIUM CHLORIDE 0.9 % IV SOLN
INTRAVENOUS | Status: DC
  Administered 2015-11-03 – 2015-11-05 (×3): via INTRAVENOUS

## 2015-11-03 MED ORDER — ACETAMINOPHEN 325 MG PO TABS: 650.0000 mg | ORAL_TABLET | Freq: Four times a day (QID) | ORAL | Status: DC | PRN

## 2015-11-03 MED ORDER — ACETAMINOPHEN 650 MG RE SUPP
650.0000 mg | Freq: Four times a day (QID) | RECTAL | Status: DC | PRN
  Administered 2015-11-04: 650 mg via RECTAL
  Filled 2015-11-03: qty 1

## 2015-11-03 NOTE — ED Notes (Signed)
Patient transported to CT 

## 2015-11-03 NOTE — ED Provider Notes (Signed)
[facility] Emergency Department Provider Note  ____________________________________________  Time seen: Seen upon arrival to the emergency department  I have reviewed the triage vital signs and the nursing notes.   HISTORY  Chief Complaint Aphasia and Weakness    HPI Cristina [name] is a 82 y.o. female with a history of atrial fibrillation and congestive heart failure who is presenting to the emergency department with altered mental status. Per her husband, she woke up "weaker than normal." He says that she needed more assistance than normal with ambulation. He says the last time she was 100% normal was last night before going to bed. He says that this morning at about 8 AM that she began to have slurred speech and he cannot understand her at this point. She is normally conversant and lives at home. She does walk with a walker at her baseline. Although her speech is garbled the patient does appear to understand my questions and is able to shake her head yes and no. She is denying any pain at this time.   Past Medical History  Diagnosis Date  . Neuropathy ([place])   . Atrial fibrillation ([employer])   . Congestive heart failure ([facility])     Diastolic dysfunction  . Punctate hemorrhage of left frontal lobe [facility])     after head trauma in Nov 2016  . Severe mitral regurgitation   . Osteoporosis   . Atrial septal defect   . DJD (degenerative joint disease)   . Diverticulosis     Patient Active Problem List   Diagnosis Date Noted  . CVA (cerebral infarction) 11/03/2015    Past Surgical History  Procedure Laterality Date  . Hip fracture surgery Left 2013  . Shoulder surgery    . Breast cyst excision    . Cataracts      Bilateral    Current Outpatient Rx  Name  Route  Sig  Dispense  Refill  . aspirin EC 325 MG tablet   Oral   Take 325 mg by mouth daily.         . furosemide (LASIX) 40 MG tablet   Oral   Take 40 mg by mouth 2 (two) times daily.           Marland Kitchen gabapentin (NEURONTIN) 300 MG capsule   Oral   Take 300 mg by mouth 2 (two) times daily.          . metoprolol succinate (TOPROL-XL) 25 MG 24 hr tablet   Oral   Take 25 mg by mouth at bedtime.         . metoprolol succinate (TOPROL-XL) 50 MG 24 hr tablet   Oral   Take 50 mg by mouth daily. Take with or immediately following a meal.         . Multiple Vitamin (MULTIVITAMIN WITH MINERALS) TABS tablet   Oral   Take 1 tablet by mouth daily.         . potassium chloride (K-DUR,KLOR-CON) 10 MEQ tablet   Oral   Take 10 mEq by mouth daily.         . vitamin B-12 (CYANOCOBALAMIN) 1000 MCG tablet   Oral   Take 1,000 mcg by mouth daily.           Allergies Review of patient's allergies indicates no known allergies.  Family History  Problem Relation Age of Onset  . Hypertension Father   . Leukemia Father   . Thyroid cancer Mother     Social  History Social History  Substance Use Topics  . Smoking status: Never Smoker   . Smokeless tobacco: None  . Alcohol Use: No    Review of Systems  Cardiac secondary to altered mental status.  ____________________________________________   PHYSICAL EXAM:  VITAL SIGNS: ED Triage Vitals  Enc Vitals Group     BP 11/03/15 1239 135/84 mmHg     Pulse Rate 11/03/15 1239 48     Resp 11/03/15 1239 22     Temp 11/03/15 1239 97.4 F (36.3 C)     Temp Source 11/03/15 1239 Oral     SpO2 11/03/15 1239 91 %     Weight --      Height --      Head Cir --      Peak Flow --      Pain Score --      Pain Loc --      Pain Edu? --      Excl. in [place]? --     Constitutional: Alert. in no acute distress. Eyes: Conjunctivae are normal. PERRL. EOMI. Head: Atraumatic. Nose: No congestion/rhinnorhea. Mouth/Throat: Mucous membranes are moist.   Neck: No stridor.   Cardiovascular: Normal rate, regular rhythm. Grossly normal heart sounds.  Good peripheral circulation. Respiratory: Normal respiratory effort.  No retractions. Lungs  CTAB. Gastrointestinal: Soft and nontender. No distention. No abdominal bruits. No CVA tenderness. Musculoskeletal: No lower extremity tenderness nor edema.  No joint effusions. Neurologic:  Garbled, unintelligible speech. No discernible facial droop. Right upper and right lower extremity with 4 out of 5 strength. With 5 out of 5 to left. Skin:  Skin is warm, dry and intact. No rash noted. Psychiatric: Mood and affect are normal. Speech and behavior are normal.  NIH Stroke Scale   Person Administering Scale: Doran Stabler  Administer stroke scale items in the order listed. Record performance in each category after each subscale exam. Do not go back and change scores. Follow directions provided for each exam technique. Scores should reflect what the patient does, not what the clinician thinks the patient can do. The clinician should record answers while administering the exam and work quickly. Except where indicated, the patient should not be coached (i.e., repeated requests to patient to make a special effort).   1a  Level of consciousness: 0=alert; keenly responsive  1b. LOC questions:  0=Performs both tasks correctly  1c. LOC commands: 0=Performs both tasks correctly  2.  Best Gaze: 0=normal  3.  Visual: 0=No visual loss  4. Facial Palsy: 0=Normal symmetric movement  5a.  Motor left arm: 0=No drift, limb holds 90 (or 45) degrees for full 10 seconds  5b.  Motor right arm: 1=Drift, limb holds 90 (or 45) degrees but drifts down before full 10 seconds: does not hit bed  6a. motor left leg: 0=No drift, limb holds 90 (or 45) degrees for full 10 seconds  6b  Motor right leg:  1=Drift, limb holds 90 (or 45) degrees but drifts down before full 10 seconds: does not hit bed  7. Limb Ataxia: 0=Absent  8.  Sensory: 0=Normal; no sensory loss  9. Best Language:  3=Mute, global aphasia; no usable speech or auditory comprehension  10. Dysarthria: 2=Severe; patient speech is so slurred as to be  unintelligible in the absence of or our of proportion to any dysphagia, or is mute/anarthric  11. Extinction and Inattention: 0=No abnormality  12. Distal motor function: 0=Normal   Total:   6  ____________________________________________   LABS (all labs ordered are listed, but only abnormal results are displayed)  Labs Reviewed  CBC WITH DIFFERENTIAL/PLATELET - Abnormal; Notable for the following:    RDW 16.3 (*)    Lymphs Abs 0.9 (*)    All other components within normal limits  URINALYSIS COMPLETEWITH MICROSCOPIC ([facility] ONLY) - Abnormal; Notable for the following:    Color, Urine YELLOW (*)    APPearance CLEAR (*)    Squamous Epithelial / LPF 0-5 (*)    All other components within normal limits  COMPREHENSIVE METABOLIC PANEL - Abnormal; Notable for the following:    CO2 33 (*)    Glucose, Bld 105 (*)    BUN 33 (*)    Creatinine, Ser 1.40 (*)    AST 45 (*)    GFR calc non Af Amer 32 (*)    GFR calc Af Amer 37 (*)    All other components within normal limits  TROPONIN I - Abnormal; Notable for the following:    Troponin I 0.24 (*)    All other components within normal limits  TROPONIN I  TROPONIN I  TROPONIN I   ____________________________________________  EKG  ED ECG REPORT I, Doran Stabler, the attending physician, personally viewed and interpreted this ECG.   Date: 11/03/2015  EKG Time: 1303  Rate: 83  Rhythm: atrial fibrillation, rate 83  Axis: Normal  Intervals:right bundle branch block and left posterior fascicular block  ST&T Change: T wave inversions in 2, 3 and aVF. As well as V3 through V6. No ST elevation or depression. V3 and V4 new T-wave inversions. ____________________________________________  RADIOLOGY  Imaging Results       DG Chest 1 View (Final result) Result time: 11/03/15 14:12:19   Final result by Rad Results In Interface (11/03/15 14:12:19)   Narrative:   CLINICAL DATA: Altered mental status, atrial  fibrillation, congestive heart failure  EXAM: CHEST 1 VIEW  COMPARISON: 07/06/2015  FINDINGS: Cardiomegaly again noted. Central mild vascular congestion without convincing pulmonary edema. There is small left pleural effusion. Bilateral basilar atelectasis or infiltrate left greater than right.  IMPRESSION: Cardiomegaly. Central vascular congestion without convincing pulmonary edema. Small left pleural effusion. Bilateral basilar atelectasis or infiltrate left greater than right.   Electronically Signed By: Lahoma Crocker M.D. On: 11/03/2015 14:12          CT Head Wo Contrast (Final result) Result time: 11/03/15 14:15:49   Final result by Rad Results In Interface (11/03/15 14:15:49)   Narrative:   CLINICAL DATA: Dysphasia  EXAM: CT HEAD WITHOUT CONTRAST  TECHNIQUE: Contiguous axial images were obtained from the base of the skull through the vertex without intravenous contrast.  COMPARISON: July 06, 2015  FINDINGS: Mild diffuse atrophy is stable. There has been interval resolution of hemorrhage from the left frontal lobe. Currently, no hemorrhage is evident on this study. There is no apparent mass, midline shift, or extra-axial fluid collection. There is mild patchy small vessel disease in the centra semiovale bilaterally. No acute infarct evident. The bony calvarium appears intact. Visualized mastoid air cells are clear. No intraorbital lesions are evident. Small foci of basal ganglia calcification remain stable and are felt to be physiologic in this age group.  IMPRESSION: Mild atrophy with mild patchy periventricular small vessel disease. Prior left frontal lobe hemorrhage has resolved. Currently there is no hemorrhage. There is no demonstrable mass or extra-axial fluid collection. No acute infarct evident.   Electronically Signed By: Lowella Grip III M.D. On: 11/03/2015  14:15        ____________________________________________   PROCEDURES    ____________________________________________   INITIAL IMPRESSION / ASSESSMENT AND PLAN / ED COURSE  Pertinent labs & imaging results that were available during my care of the patient were reviewed by me and considered in my medical decision making (see chart for details).  Patient last seen completely normal last night. Worsened this morning around 8 AM and arrival to the emergency department at 12:30 PM. She is out of the window for TPA.    ----------------------------------------- 4:14 PM on 11/03/2015 -----------------------------------------  Patient still aphasic. No worsening of her weakness. No acute findings on the CT of her brain. Will be admitted to the hospital. Elevated troponin.  Renal failure versus cerebral cause for the elevation. Family aware of need for admission the hospital. Patient took 325 of aspirin this morning prior to arrival. Signed out to Dr. Tressia Miners. ____________________________________________   FINAL CLINICAL IMPRESSION(S) / ED DIAGNOSES  Final diagnoses:  CVA (cerebral infarction)      Orbie Pyo, MD 11/03/15 1615

## 2015-11-03 NOTE — H&P (Addendum)
[facility] [place] NAME: Cristina [name]    MR#:  [id number]  DATE OF BIRTH:  01-19-1924  DATE OF ADMISSION:  11/03/2015  PRIMARY CARE PHYSICIAN: Glendon Axe, MD   REQUESTING/REFERRING PHYSICIAN: Dr. Rober Minion  CHIEF COMPLAINT:   Chief Complaint  Patient presents with  . Aphasia  . Weakness    HISTORY OF PRESENT ILLNESS:  Cristina [name]  is a 82 y.o. female with a known history of atrial fibrillation, congestive heart failure with diastolic dysfunction, questionable history of atrial septal defect, severe mitral regurgitation, hypertension and head trauma in November 2016 after a fall resulting in left frontal hemorrhage presents to the hospital secondary to slurred speech. Patient is nonverbal at this time and most of the history is obtained from her husband and daughter at bedside. Patient was last seen to be normal as last night prior to going to bed. Her speech was normal at that time, she has chronic weakness in legs due to lower extremity edema and uses a walker. This morning she was still in bed because she didn't feel good, and family went to check on her, her speech was noted to be very slurred. EMS was called and she was even talking during transportation and answering simple questions but with the slurred speech. While in the emergency room, patient has become nonverbal. She has significant right upper extremity weakness when compared to the left which is new. Patient is right-handed at baseline. She still comprehending and following commands and nodding head appropriately for questions. She hasn't been on any anticoagulation due to her left frontal hemorrhage after her fall in November 2016. Bedside swallow screen has been ordered. CT of the head now showing clearing of her old frontal hemorrhage and no other acute findings noted. Patient is being admitted for possible CVA.  PAST MEDICAL HISTORY:   Past Medical  History  Diagnosis Date  . Neuropathy ([place])   . Atrial fibrillation ([place])   . Congestive heart failure ([facility])     Diastolic dysfunction  . Punctate hemorrhage of left frontal lobe [facility])     after head trauma in Nov 2016  . Severe mitral regurgitation   . Osteoporosis   . Atrial septal defect   . DJD (degenerative joint disease)   . Diverticulosis     PAST SURGICAL HISTORY:   Past Surgical History  Procedure Laterality Date  . Hip fracture surgery Left 2013  . Shoulder surgery    . Breast cyst excision    . Cataracts      Bilateral    SOCIAL HISTORY:   Social History  Substance Use Topics  . Smoking status: Never Smoker   . Smokeless tobacco: Not on file  . Alcohol Use: No    FAMILY HISTORY:   Family History  Problem Relation Age of Onset  . Hypertension Father   . Leukemia Father   . Thyroid cancer Mother     DRUG ALLERGIES:  No Known Allergies  REVIEW OF SYSTEMS:   Review of Systems  Unable to perform ROS: patient nonverbal    MEDICATIONS AT HOME:   Prior to Admission medications   Medication Sig Start Date End Date Taking? Authorizing Provider  aspirin EC 325 MG tablet Take 325 mg by mouth daily.   Yes Historical Provider, MD  furosemide (LASIX) 40 MG tablet Take 40 mg by mouth 2 (two) times daily.    Yes Historical Provider, MD  gabapentin (NEURONTIN) 300 MG capsule  Take 300 mg by mouth 2 (two) times daily.    Yes Historical Provider, MD  metoprolol succinate (TOPROL-XL) 25 MG 24 hr tablet Take 25 mg by mouth at bedtime.   Yes Historical Provider, MD  metoprolol succinate (TOPROL-XL) 50 MG 24 hr tablet Take 50 mg by mouth daily. Take with or immediately following a meal.   Yes Historical Provider, MD  Multiple Vitamin (MULTIVITAMIN WITH MINERALS) TABS tablet Take 1 tablet by mouth daily.   Yes Historical Provider, MD  potassium chloride (K-DUR,KLOR-CON) 10 MEQ tablet Take 10 mEq by mouth daily.   Yes Historical Provider, MD  vitamin B-12  (CYANOCOBALAMIN) 1000 MCG tablet Take 1,000 mcg by mouth daily.   Yes Historical Provider, MD      VITAL SIGNS:  Blood pressure 125/85, pulse 87, temperature 97.4 F (36.3 C), temperature source Oral, resp. rate 17, SpO2 93 %.  PHYSICAL EXAMINATION:   Physical Exam  GENERAL:  82 y.o.-year-old elderly patient lying in the bed with no acute distress.  EYES: Pupils equal, round, reactive to light and accommodation. No scleral icterus. Extraocular muscles intact.  HEENT: Head atraumatic, normocephalic. Oropharynx and nasopharynx clear. Dry mucus membranes. No obvious facial droop noted. NECK:  Supple, no jugular venous distention. No thyroid enlargement, no tenderness.  LUNGS: Normal breath sounds bilaterally, no wheezing, rales,rhonchi or crepitation. No use of accessory muscles of respiration. Decreased bibasilar breath sounds CARDIOVASCULAR: S1, S2 normal. No  rubs, or gallops. 3/6 systolic murmur present. ABDOMEN: Soft, nontender, nondistended. Bowel sounds present. No organomegaly or mass.  EXTREMITIES: No cyanosis, or clubbing. 3+ bilateral pedal edema present. NEUROLOGIC: Cranial nerves II through XII are intact. Nonverbal now.  Right UE strength is 3/5, left is 5/5, more proximal muscle weakness noted. Bilateral lower extremity weakness - RLE 2/5, LLE 3/5 strength. Sensation intact. Gait not checked.  FOLLOWING COMMANDS PSYCHIATRIC: The patient is alert and following commands.  SKIN: No obvious rash, lesion, or ulcer.   LABORATORY PANEL:   CBC  Recent Labs Lab 11/03/15 1032  WBC 4.2  HGB 13.1  HCT 40.0  PLT 170   ------------------------------------------------------------------------------------------------------------------  Chemistries   Recent Labs Lab 11/03/15 1427  NA 141  K 4.5  CL 101  CO2 33*  GLUCOSE 105*  BUN 33*  CREATININE 1.40*  CALCIUM 9.1  AST 45*  ALT 19  ALKPHOS 64  BILITOT 0.6    ------------------------------------------------------------------------------------------------------------------  Cardiac Enzymes  Recent Labs Lab 11/03/15 1427  TROPONINI 0.24*   ------------------------------------------------------------------------------------------------------------------  RADIOLOGY:  Dg Chest 1 View  11/03/2015  CLINICAL DATA:  Altered mental status, atrial fibrillation, congestive heart failure EXAM: CHEST 1 VIEW COMPARISON:  07/06/2015 FINDINGS: Cardiomegaly again noted. Central mild vascular congestion without convincing pulmonary edema. There is small left pleural effusion. Bilateral basilar atelectasis or infiltrate left greater than right. IMPRESSION: Cardiomegaly. Central vascular congestion without convincing pulmonary edema. Small left pleural effusion. Bilateral basilar atelectasis or infiltrate left greater than right. Electronically Signed   By: Lahoma Crocker M.D.   On: 11/03/2015 14:12   Ct Head Wo Contrast  11/03/2015  CLINICAL DATA:  Dysphasia EXAM: CT HEAD WITHOUT CONTRAST TECHNIQUE: Contiguous axial images were obtained from the base of the skull through the vertex without intravenous contrast. COMPARISON:  July 06, 2015 FINDINGS: Mild diffuse atrophy is stable. There has been interval resolution of hemorrhage from the left frontal lobe. Currently, no hemorrhage is evident on this study. There is no apparent mass, midline shift, or extra-axial fluid collection. There is mild patchy small  vessel disease in the centra semiovale bilaterally. No acute infarct evident. The bony calvarium appears intact. Visualized mastoid air cells are clear. No intraorbital lesions are evident. Small foci of basal ganglia calcification remain stable and are felt to be physiologic in this age group. IMPRESSION: Mild atrophy with mild patchy periventricular small vessel disease. Prior left frontal lobe hemorrhage has resolved. Currently there is no hemorrhage. There is no  demonstrable mass or extra-axial fluid collection. No acute infarct evident. Electronically Signed   By: Lowella Grip III M.D.   On: 11/03/2015 14:15    EKG:   Orders placed or performed during the hospital encounter of 11/03/15  . ED EKG  . ED EKG  . EKG 12-Lead  . EKG 12-Lead    IMPRESSION AND PLAN:   Cristina [name]  is a 82 y.o. female with a known history of atrial fibrillation, congestive heart failure with diastolic dysfunction, questionable history of atrial septal defect, severe mitral regurgitation, hypertension and head trauma in November 2016 after a fall resulting in left frontal hemorrhage presents to the hospital secondary to slurred speech.  #1 Acute CVA- possible left MCA infarct. Aphasic and also right-sided weakness noted. -Neurology consulted. Last known normal last night per husband. - neuro checks. -Bedside swallow screen ordered. -Started on rectal aspirin. Statin can be started if she is able to swallow. -Physical therapy, occupational therapy and speech therapy consults ordered. -MRI of the brain, carotid Dopplers and echocardiogram ordered. -Questionable history of atrial septal defect. Will need anticoagulation in the future now that her left frontal hemorrhage has resolved. And it was trauma related. -CODE STATUS discussed with daughter and also husband at bedside. Full code at this time. Asian does not have a living will.  #2 acute renal failure- from overdiuresis. ATN. -Gentle hydration. Hold nephrotoxins. Continue to monitor closely.  #3 diastolic CHF- echo with EF 55% but severe mitral and tricuspid regurgitation and diastolic dysfunction. -Lasix twice a day has been started as outpatient due to worsening lower extremity edema. However appears clinically dry, hold Lasix. -Gentle hydration for 1 day. -Compression stockings and keep the legs elevated  #4 atrial fibrillation-rate controlled. Not on anticoagulation due to prior hemorrhage. -Aspirin  rectally for now if unable to pass swallow screen. -Metoprolol on hold while swallow screen is pending  #5 elevated troponin-could be demand ischemia. Patient did not have any chest pain or prior cardiac history. -Continue to recycle troponins. Admit to telemetry. -Anyway we'll not do any systemic anticoagulation due to suspicious stroke at this time  #6 DVT prophylaxis-started on Lovenox    All the records are reviewed and case discussed with ED provider. Management plans discussed with the patient, family and they are in agreement.  CODE STATUS: Full Code  TOTAL TIME TAKING CARE OF THIS PATIENT: 50 minutes.    Gladstone Lighter M.D on 11/03/2015 at 4:06 PM  Between 7am to 6pm - Pager - [phone]  After 6pm go to [email or web address] - password [email or web address] [facility]  Office  [phone]  CC: Primary care physician; Glendon Axe, MD

## 2015-11-03 NOTE — ED Notes (Signed)
PAtient arrived by EMS from home. Husband states "I could tell patient wasn't feeling ok this morning and around 0800 or 0900 I could notice something was wrong and not moving around as good". Patient is not on blood thinners. Upon arrival to ER Patient is having trouble speaking/getting words out.

## 2015-11-03 NOTE — ED Notes (Signed)
Patient transported to MRI 

## 2015-11-04 ENCOUNTER — Inpatient Hospital Stay: Payer: Medicare Other

## 2015-11-04 ENCOUNTER — Inpatient Hospital Stay
Admit: 2015-11-04 | Discharge: 2015-11-04 | Disposition: A | Discharge status: Home / Self Care | Payer: Medicare Other | Attending: Internal Medicine | Admitting: Internal Medicine

## 2015-11-04 DIAGNOSIS — I639 Cerebral infarction, unspecified: Secondary | ICD-10-CM

## 2015-11-04 LAB — BASIC METABOLIC PANEL
Anion gap: 8 (ref 5–15)
BUN: 30 mg/dL — AB (ref 6–20)
CALCIUM: 8.8 mg/dL — AB (ref 8.9–10.3)
CO2: 29 mmol/L (ref 22–32)
Chloride: 105 mmol/L (ref 101–111)
Creatinine, Ser: 1.16 mg/dL — ABNORMAL HIGH (ref 0.44–1.00)
GFR calc Af Amer: 47 mL/min — ABNORMAL LOW (ref >60)
GFR, EST NON AFRICAN AMERICAN: 40 mL/min — AB (ref >60)
GLUCOSE: 101 mg/dL — AB (ref 65–99)
Potassium: 4 mmol/L (ref 3.5–5.1)
Sodium: 142 mmol/L (ref 135–145)

## 2015-11-04 LAB — ECHOCARDIOGRAM COMPLETE: Weight: 2040 oz

## 2015-11-04 LAB — CBC
HEMATOCRIT: 36.5 % (ref 35.0–47.0)
Hemoglobin: 12.1 g/dL (ref 12.0–16.0)
MCH: 29.3 pg (ref 26.0–34.0)
MCHC: 33.2 g/dL (ref 32.0–36.0)
MCV: 88.2 fL (ref 80.0–100.0)
Platelets: 162 10*3/uL (ref 150–440)
RBC: 4.13 MIL/uL (ref 3.80–5.20)
RDW: 16.1 % — AB (ref 11.5–14.5)
WBC: 5.8 10*3/uL (ref 3.6–11.0)

## 2015-11-04 LAB — LIPID PANEL
CHOL/HDL RATIO: 2.4 ratio
Cholesterol: 126 mg/dL (ref 0–200)
HDL: 53 mg/dL (ref >40)
LDL Cholesterol: 61 mg/dL (ref 0–99)
TRIGLYCERIDES: 62 mg/dL (ref <150)
VLDL: 12 mg/dL (ref 0–40)

## 2015-11-04 LAB — HEMOGLOBIN A1C: HEMOGLOBIN A1C: 6 % (ref 4.0–6.0)

## 2015-11-04 LAB — TROPONIN I
TROPONIN I: 0.19 ng/mL — AB (ref <0.031)
Troponin I: 0.2 ng/mL — ABNORMAL HIGH (ref <0.031)

## 2015-11-04 LAB — LIPASE, BLOOD: LIPASE: 15 U/L (ref 11–51)

## 2015-11-04 MED ORDER — ATORVASTATIN CALCIUM 20 MG PO TABS
40.0000 mg | ORAL_TABLET | Freq: Every day | ORAL | Status: DC
  Administered 2015-11-05: 40 mg via ORAL
  Filled 2015-11-04: qty 2

## 2015-11-04 MED ORDER — ENOXAPARIN SODIUM 30 MG/0.3ML ~~LOC~~ SOLN
30.0000 mg | SUBCUTANEOUS | Status: DC
  Administered 2015-11-04 – 2015-11-05 (×2): 30 mg via SUBCUTANEOUS
  Filled 2015-11-04 (×2): qty 0.3

## 2015-11-04 MED ORDER — CLOPIDOGREL BISULFATE 75 MG PO TABS
75.0000 mg | ORAL_TABLET | Freq: Every day | ORAL | Status: DC
  Administered 2015-11-05 – 2015-11-06 (×2): 75 mg via ORAL
  Filled 2015-11-04 (×2): qty 1

## 2015-11-04 MED ORDER — ASPIRIN EC 325 MG PO TBEC
325.0000 mg | DELAYED_RELEASE_TABLET | Freq: Every day | ORAL | Status: DC
  Administered 2015-11-05 – 2015-11-06 (×2): 325 mg via ORAL
  Filled 2015-11-04 (×2): qty 1

## 2015-11-04 NOTE — Progress Notes (Signed)
*  PRELIMINARY RESULTS* Echocardiogram 2D Echocardiogram has been performed.  Cristina [name] 11/04/2015, 9:39 AM

## 2015-11-04 NOTE — Consult Note (Signed)
Referring Physician: Tressia Miners    Chief Complaint: Difficulty with speech and right sided weakness  HPI: Cristina [name] is an 82 y.o. female with a known history of atrial fibrillation, congestive heart failure with diastolic dysfunction, questionable history of atrial septal defect, severe mitral regurgitation, hypertension and head trauma in November 2016 after a fall resulting in left frontal hemorrhage presents to the hospital unable to speak intelligibly.  Patient unable to provide any history due to speech and all history obtained from family.  Patient awakened yesterday morning and was not speaking to her husband.  He felt that she just did not feel well but when she spoke on the phone to her daughter there were only mumbles.  EMS was called at that time.  It appears that she was talking during transportation and answering simple questions but with the slurred speech. While in the emergency room, patient became nonverbal. Right sided weakness was noted as well.  Patient went to bed the night before and was normal at that time. Initial NIHSS of 6. At baseline patient is able to handle all of her own ADL's.  Is to use a walker for gait but around the house just holds onto things.   She hasn't been on any anticoagulation due to her left frontal hemorrhage after her fall in November 2016.  Date last known well: 11/02/2015 Time last known well: Time: 22:30 tPA Given: No: Outside time window, h/o hemorrhage  Modified Rankin: Rankin Score=1  Past Medical History  Diagnosis Date  . Neuropathy ([place])   . Atrial fibrillation ([place])   . Congestive heart failure ([facility])     Diastolic dysfunction  . Punctate hemorrhage of left frontal lobe [facility])     after head trauma in Nov 2016  . Severe mitral regurgitation   . Osteoporosis   . Atrial septal defect   . DJD (degenerative joint disease)   . Diverticulosis     Past Surgical History  Procedure Laterality Date  . Hip fracture surgery Left 2013   . Shoulder surgery    . Breast cyst excision    . Cataracts      Bilateral    Family History  Problem Relation Age of Onset  . Hypertension Father   . Leukemia Father   . Thyroid cancer Mother    Social History:  reports that she has never smoked. She does not have any smokeless tobacco history on file. She reports that she does not drink alcohol or use illicit drugs.  Allergies: No Known Allergies  Medications:  I have reviewed the patient's current medications. Prior to Admission:  Prescriptions prior to admission  Medication Sig Dispense Refill Last Dose  . aspirin EC 325 MG tablet Take 325 mg by mouth daily.   11/03/2015 at 1000  . furosemide (LASIX) 40 MG tablet Take 40 mg by mouth 2 (two) times daily.    11/03/2015 at Unknown time  . gabapentin (NEURONTIN) 300 MG capsule Take 300 mg by mouth 2 (two) times daily.    11/03/2015 at Unknown time  . metoprolol succinate (TOPROL-XL) 25 MG 24 hr tablet Take 25 mg by mouth at bedtime.   11/02/2015 at 1900  . metoprolol succinate (TOPROL-XL) 50 MG 24 hr tablet Take 50 mg by mouth daily. Take with or immediately following a meal.   11/03/2015 at 1000  . Multiple Vitamin (MULTIVITAMIN WITH MINERALS) TABS tablet Take 1 tablet by mouth daily.   11/03/2015 at Unknown time  . potassium chloride (K-DUR,KLOR-CON) 10  MEQ tablet Take 10 mEq by mouth daily.   11/03/2015 at Unknown time  . vitamin B-12 (CYANOCOBALAMIN) 1000 MCG tablet Take 1,000 mcg by mouth daily.   11/03/2015 at Unknown time   Scheduled: . aspirin  300 mg Rectal Daily  . enoxaparin (LOVENOX) injection  30 mg Subcutaneous Q24H  . sodium chloride flush  3 mL Intravenous Q12H    ROS: History obtained from family  General ROS: negative for - chills, fatigue, fever, night sweats, weight gain or weight loss Psychological ROS: negative for - behavioral disorder, hallucinations, memory difficulties, mood swings or suicidal ideation Ophthalmic ROS: negative for - blurry vision, double  vision, eye pain or loss of vision ENT ROS: negative for - epistaxis, nasal discharge, oral lesions, sore throat, tinnitus or vertigo Allergy and Immunology ROS: negative for - hives or itchy/watery eyes Hematological and Lymphatic ROS: negative for - bleeding problems, bruising or swollen lymph nodes Endocrine ROS: negative for - galactorrhea, hair pattern changes, polydipsia/polyuria or temperature intolerance Respiratory ROS: negative for - cough, hemoptysis, shortness of breath or wheezing Cardiovascular ROS: LE edema Gastrointestinal ROS: negative for - abdominal pain, diarrhea, hematemesis, nausea/vomiting or stool incontinence Genito-Urinary ROS: negative for - dysuria, hematuria, incontinence or urinary frequency/urgency Musculoskeletal ROS: negative for - joint swelling or muscular weakness Neurological ROS: as noted in HPI Dermatological ROS: negative for rash and skin lesion changes  Physical Examination: Blood pressure 101/62, pulse 92, temperature 98.1 F (36.7 C), temperature source Oral, resp. rate 18, weight 57.834 kg (127 lb 8 oz), SpO2 93 %.  HEENT-  Normocephalic, no lesions, without obvious abnormality.  Normal external eye and conjunctiva.  Normal TM's bilaterally.  Normal auditory canals and external ears. Normal external nose, mucus membranes and septum.  Normal pharynx. Cardiovascular- S1, S2 normal, pulses palpable throughout   Lungs- chest clear, no wheezing, rales, normal symmetric air entry Abdomen- soft, non-tender; bowel sounds normal; no masses,  no organomegaly Extremities- BLE pitting edema Lymph-no adenopathy palpable Musculoskeletal-tenderness to palpation in the lower extremities Skin-warm and dry, no hyperpigmentation, vitiligo, or suspicious lesions  Neurological Examination Mental Status: Alert.  Nonverbal.  Follows some commands but not all commands particularly as they get more specific and dod not have associated nonverbal cues.   Cranial  Nerves: II: Discs flat bilaterally; Visual fields grossly normal, pupils equal, round, reactive to light and accommodation III,IV, VI: ptosis not present, extra-ocular motions intact bilaterally V,VII: right facial droop, facial light touch sensation unable to be assessed VIII: hearing normal bilaterally IX,X: gag reflex reduced XI: shoulder shrug decreased on the right XII: midline tongue extension Motor: Right : Upper extremity   2-3/5    Left:     Upper extremity   5/5  Lower extremity   1/5      Lower extremity   4+/5 Tone and bulk:normal tone throughout; no atrophy noted Sensory: Unable to be assessed fully.  Patient able to appreciate sensation bilaterally Deep Tendon Reflexes: 2+ in the upper extremities, absent in the lower extremities Plantars: Right: mute   Left: mute Cerebellar: Normal finger-to-nose testing of the LUE.  Unable to perform with the right and unable to perform lower extremity testing due to weakness and ability to follow commands.   Gait: Unable to test   Laboratory Studies:  Basic Metabolic Panel:  Recent Labs Lab 11/03/15 1427 11/04/15 0518  NA 141 142  K 4.5 4.0  CL 101 105  CO2 33* 29  GLUCOSE 105* 101*  BUN 33* 30*  CREATININE  1.40* 1.16*  CALCIUM 9.1 8.8*    Liver Function Tests:  Recent Labs Lab 11/03/15 1427  AST 45*  ALT 19  ALKPHOS 64  BILITOT 0.6  PROT 6.8  ALBUMIN 3.7    Recent Labs Lab 11/03/15 2346  LIPASE 15   No results for input(s): AMMONIA in the last 168 hours.  CBC:  Recent Labs Lab 11/03/15 1032 11/04/15 0518  WBC 4.2 5.8  NEUTROABS 2.7  --   HGB 13.1 12.1  HCT 40.0 36.5  MCV 89.4 88.2  PLT 170 162    Cardiac Enzymes:  Recent Labs Lab 11/03/15 1427 11/03/15 1622 11/03/15 2346 11/04/15 0518  TROPONINI 0.24* 0.24* 0.20* 0.19*    BNP: Invalid input(s): POCBNP  CBG: No results for input(s): GLUCAP in the last 168 hours.  Microbiology: No results found for this or any previous  visit.  Coagulation Studies: No results for input(s): LABPROT, INR in the last 72 hours.  Urinalysis:  Recent Labs Lab 11/03/15 1320  COLORURINE YELLOW*  LABSPEC 1.011  PHURINE 6.0  GLUCOSEU NEGATIVE  HGBUR NEGATIVE  BILIRUBINUR NEGATIVE  KETONESUR NEGATIVE  PROTEINUR NEGATIVE  NITRITE NEGATIVE  LEUKOCYTESUR NEGATIVE    Lipid Panel: No results found for: CHOL, TRIG, HDL, CHOLHDL, VLDL, LDLCALC  HgbA1C:  Lab Results  Component Value Date   HGBA1C 6.0 11/03/2015    Urine Drug Screen:  No results found for: LABOPIA, COCAINSCRNUR, LABBENZ, AMPHETMU, THCU, LABBARB  Alcohol Level: No results for input(s): ETH in the last 168 hours.  Other results: EKG: atrial fibrillation, rate 83 bpm.  Imaging: Dg Chest 1 View  11/03/2015  CLINICAL DATA:  Altered mental status, atrial fibrillation, congestive heart failure EXAM: CHEST 1 VIEW COMPARISON:  07/06/2015 FINDINGS: Cardiomegaly again noted. Central mild vascular congestion without convincing pulmonary edema. There is small left pleural effusion. Bilateral basilar atelectasis or infiltrate left greater than right. IMPRESSION: Cardiomegaly. Central vascular congestion without convincing pulmonary edema. Small left pleural effusion. Bilateral basilar atelectasis or infiltrate left greater than right. Electronically Signed   By: Lahoma Crocker M.D.   On: 11/03/2015 14:12   Ct Head Wo Contrast  11/03/2015  CLINICAL DATA:  Dysphasia EXAM: CT HEAD WITHOUT CONTRAST TECHNIQUE: Contiguous axial images were obtained from the base of the skull through the vertex without intravenous contrast. COMPARISON:  July 06, 2015 FINDINGS: Mild diffuse atrophy is stable. There has been interval resolution of hemorrhage from the left frontal lobe. Currently, no hemorrhage is evident on this study. There is no apparent mass, midline shift, or extra-axial fluid collection. There is mild patchy small vessel disease in the centra semiovale bilaterally. No acute  infarct evident. The bony calvarium appears intact. Visualized mastoid air cells are clear. No intraorbital lesions are evident. Small foci of basal ganglia calcification remain stable and are felt to be physiologic in this age group. IMPRESSION: Mild atrophy with mild patchy periventricular small vessel disease. Prior left frontal lobe hemorrhage has resolved. Currently there is no hemorrhage. There is no demonstrable mass or extra-axial fluid collection. No acute infarct evident. Electronically Signed   By: Lowella Grip III M.D.   On: 11/03/2015 14:15   Mr Brain Wo Contrast  11/03/2015  CLINICAL DATA:  Stroke. Atrial fibrillation. Altered mental status. Weak. EXAM: MRI HEAD WITHOUT CONTRAST TECHNIQUE: Multiplanar, multiecho pulse sequences of the brain and surrounding structures were obtained without intravenous contrast. COMPARISON:  CT head 11/03/2015. FINDINGS: Small sub cm area of acute infarct in the left temporoparietal lobe. Additional possible area of  acute infarct in the left insula. This could represent T2 shine through versus recent infarction. Mild atrophy.  Ventricular enlargement consistent with atrophy. Negative for intracranial hemorrhage. Mild chronic microvascular ischemic changes in the white matter. Brainstem and cerebellum normal. Negative for mass or edema.  No shift of the midline structures. Paranasal sinuses clear. Skull base normal. Pituitary normal in size. IMPRESSION: Small sub cm area of acute infarct in the left temporoparietal lobe. Possible acute infarct in the left insula. Atrophy and mild chronic microvascular ischemia Electronically Signed   By: Franchot Gallo M.D.   On: 11/03/2015 17:50   US Carotid Bilateral  11/04/2015  CLINICAL DATA:  Stroke EXAM: BILATERAL CAROTID DUPLEX ULTRASOUND TECHNIQUE: Pearline Cables scale imaging, color Doppler and duplex ultrasound was performed of bilateral carotid and vertebral arteries in the neck. COMPARISON:  07/24/2003 by report only REVIEW  OF SYSTEMS: Quantification of carotid stenosis is based on velocity parameters that correlate the residual internal carotid diameter with NASCET-based stenosis levels, using the diameter of the distal internal carotid lumen as the denominator for stenosis measurement. The following velocity measurements were obtained: PEAK SYSTOLIC/END DIASTOLIC RIGHT ICA:                     81/16cm/sec CCA:                     [id number] SYSTOLIC ICA/CCA RATIO:  1.0 DIASTOLIC ICA/CCA RATIO: 1.1 ECA:                     98cm/sec LEFT ICA:                     90/24cm/sec CCA:                     [id number] SYSTOLIC ICA/CCA RATIO:  1.0 DIASTOLIC ICA/CCA RATIO: 1.3 ECA:                     94cm/sec FINDINGS: RIGHT CAROTID ARTERY: Mild eccentric partially calcified plaque in the bulb. No high-grade stenosis. Normal waveforms and color Doppler signal. RIGHT VERTEBRAL ARTERY:  Normal flow direction and waveform. LEFT CAROTID ARTERY: Intimal thickening in the bulb. No focal plaque accumulation or stenosis. Normal waveforms and color Doppler signal. LEFT VERTEBRAL ARTERY: Normal flow direction and waveform. IMPRESSION: 1. Mild right carotid bulb plaque resulting in less than 50% diameter stenosis. The exam does not exclude plaque ulceration or embolization. Continued surveillance recommended. Electronically Signed   By: Lucrezia Europe M.D.   On: 11/04/2015 09:31    Assessment: 82 y.o. female presenting with aphasia and right sided weakness.  Patient with a history of atrial fibrillation on ASA.  Not an anticoagulation candidate due to falls and recent ICH.  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  Echocardiogram shows an EF of 60-65% and evidence of pulmonary hypertension.  Carotid dopplers shows no hemodynamically significant stenosis.  A1c 6.0.    Stroke Risk Factors - atrial fibrillation  Plan: 1. Fasting lipid panel 2. PT consult, OT consult, Speech  consult 3. Prophylactic therapy-Add Plavix 75mg  daily 4. NPO until RN stroke swallow screen 5. Telemetry monitoring 6. Frequent neuro checks   Alexis Goodell, MD Neurology [phone] 11/04/2015, 10:31 AM

## 2015-11-04 NOTE — Evaluation (Signed)
Physical Therapy Evaluation Patient Details Name: Cristina [name] MRN: [id number] DOB: 05/16/1924 Today's Date: 11/04/2015   History of Present Illness  Cristina [name] is an 82 y.o. female with a known history of atrial fibrillation, congestive heart failure with diastolic dysfunction, questionable history of atrial septal defect, severe mitral regurgitation, hypertension and head trauma in November 2016 after a fall resulting in left frontal hemorrhage presents to the hospital unable to speak intelligibly.  Patient unable to provide any history due to speech and all history obtained from family.  Patient awakened yesterday morning and was not speaking to her husband.  He felt that she just did not feel well but when she spoke on the phone to her daughter there were only mumbles.  EMS was called at that time.  It appears that she was talking during transportation and answering simple questions but with the slurred speech. While in the emergency room, patient became nonverbal. Right sided weakness was noted as well.  Patient went to bed the night before and was normal at that time. Initial NIHSS of 6. At baseline patient is able to handle all of her own ADL's. Assist for IADLs. Pt uses a walker vs "furniture walking" around the house. She hasn't been on any anticoagulation due to her left frontal hemorrhage after her fall in November 2016. Other than the one fall in November family denies any other falls in the last 12 months. History is primarily provided by patient's sons. One of her sons states that family wishes patient to be "comfortable in her own home." Family clearly overwhelmed by the current situation. Pt unable to provide meaningful history at this time. She does appear to provide some appropriate one word responses to questioning as well as head nods. She follows commands well but has difficulty expressing wishes/thoughts. Unclear if patient is currently experiencing any confusion or simply  aphasia. Head nods are not always correct to simple questions regarding orientation.   Clinical Impression  Pt with notable R sided weakness during PT evaluation however better than expected based on previous descriptions. Please see extremity section for detailed description of strength. Overall she demonstrates adequate RLE to assist with transfers and support body weight. She is able to flex R shoulder to approximately 100 degrees actively however only accepts minimal challenge. R elbow flexion/extension strength is fair but R grip strength is diminished. In standing pt leans heavily to the R side requiring constant support to maintain balance. She has a few brief periods of CGA only but mostly requiring consistent support. Unable to attempt ambulation at this time due to poor static standing balance. Pt able to perform dynamic reaching with RUE in sitting and overall sitting balance is fair. She is able to cross midline with RUE but does present with some minimal R side neglect. She is however able to turn head and eyes to the R with verbal and tactile cues. Pt would benefit from SNF placement at discharge for PT/OT/SLP. Family is unsure if they would like to pursue SNF placement or provide 24/7 in home care with [facility] services. They are not interested in pursuing inpatient rehab and pt would likely not demonstrate tolerance for intensity required. Pt will benefit from skilled PT services to address deficits in strength, balance, and mobility in order to return to full function at home.     Follow Up Recommendations SNF    Equipment Recommendations  None recommended by PT;Other (comment) (Pt may need hemiwalker. TBD)    Recommendations for  Other Services       Precautions / Restrictions Precautions Precautions: Fall Restrictions Weight Bearing Restrictions: No      Mobility  Bed Mobility Overal bed mobility: Needs Assistance Bed Mobility: Supine to Sit;Sit to Supine     Supine to sit: Mod  assist Sit to supine: Mod assist   General bed mobility comments: Pt with ability to reach for bed rail across midline with RUE during bed mobility. HOB elevated and bed rail utilized but pt provides purposeful and significant effort to assist with bed mobility. Good command follow with ability to follow simple commands at least 75-80% of the time. Once upright pt with fair static sitting balance and only intermittently starts to lean posteriorly requiring verbal cues to shift anteriorly  Transfers Overall transfer level: Needs assistance Equipment used: Rolling walker (2 wheeled) Transfers: Sit to/from Stand Sit to Stand: Mod assist         General transfer comment: Pt demonstrates fair strength in RLE with sit to stand transfers. Balance is poor with patient leaning heavily to the R. Cues for proper hand placement and hand over hand assist to move RUE onto walker. Pt gradually able to self correct for balance disturbance with very brief bouts of CGA only but mostly requiring consistent support on R side to maintain balance  Ambulation/Gait             General Gait Details: Unable to perform at this time  Stairs            Wheelchair Mobility    Modified Rankin (Stroke Patients Only)       Balance Overall balance assessment: Needs assistance Sitting-balance support: No upper extremity supported Sitting balance-Leahy Scale: Fair Sitting balance - Comments: Only accepts minimal challenge to balance. Intermittent posterior leaning requiring therapist correct to prevent LOB posteriorly     Standing balance-Leahy Scale: Poor Standing balance comment: Pt leaning heavily to the R in standing requiring constant assist from therapist to maintain balance. Brief periods of CGA in standing. Worked with patient on some weight shifting in standing. Also worked with patient on dynamic reaching in multiple planes in sitting                             Pertinent  Vitals/Pain Pain Assessment: Faces Faces Pain Scale: No hurt Pain Location: Denies pain but also no grimacing/groaning during mobility    Home Living Family/patient expects to be discharged to:: Private residence Living Arrangements: Spouse/significant other Available Help at Discharge: Family Type of Home: House Home Access: Stairs to enter Entrance Stairs-Rails: Psychiatric nurse of Steps: 4 Home Layout: One level Home Equipment: Environmental consultant - 4 wheels;Walker - 2 wheels;Walker - standard;Bedside commode (no shower seat)      Prior Function Level of Independence: Needs assistance   Gait / Transfers Assistance Needed: Rollator vs furniture walking in the house  ADL's / Homemaking Assistance Needed: Mostly independent with ADLs. Mostly bathes at sink  Comments: Assist from husband/family for IADLs     Hand Dominance   Dominant Hand: Right    Extremity/Trunk Assessment   Upper Extremity Assessment: RUE deficits/detail;LUE deficits/detail RUE Deficits / Details: LUE strength is at least 4 to 4+/5 throughout. R shoulder flexion is 2-/5 with ability to actively flex shoulder to approximately 100 degrees and takes minimal challenge. R elbow flexion/extension at least 4-/5 without notable increase in tone. Decreased R grip strength but fluctuates with command follow. Pt appears  to have some mild R side neglect but will look R with verbal and tactile commands   RUE Sensation:  (Unable to assess due to expressive aphasia. )     Lower Extremity Assessment: RLE deficits/detail RLE Deficits / Details: LLE strength grossly 4 to 4+/5 throughout the exception of 4-/5 L hip flexion. RLE hip flexion 4-/5, knee flexion/extension at least 4/5. 3+/5 R ankle DF       Communication   Communication: Expressive difficulties  Cognition Arousal/Alertness: Awake/alert Behavior During Therapy: WFL for tasks assessed/performed;Restless (appears mildly restless) Overall Cognitive Status:  Difficult to assess                      General Comments      Exercises        Assessment/Plan    PT Assessment Patient needs continued PT services  PT Diagnosis Difficulty walking;Generalized weakness;Other (comment) (Hemiparesis dominant side)   PT Problem List Decreased strength;Decreased activity tolerance;Decreased balance;Decreased mobility;Decreased cognition;Decreased coordination;Decreased knowledge of use of DME  PT Treatment Interventions DME instruction;Gait training;Stair training;Functional mobility training;Therapeutic activities;Therapeutic exercise;Balance training;Neuromuscular re-education;Cognitive remediation;Patient/family education;Wheelchair mobility training;Manual techniques   PT Goals (Current goals can be found in the Care Plan section) Acute Rehab PT Goals Patient Stated Goal: For patient to be comfortable at home PT Goal Formulation: With family (Pt with expressive aphasia, unable to communicate) Time For Goal Achievement: 11/18/15 Potential to Achieve Goals: Fair    Frequency 7X/week   Barriers to discharge        Co-evaluation               End of Session Equipment Utilized During Treatment: Gait belt Activity Tolerance: Patient tolerated treatment well Patient left: in bed;with call bell/phone within reach;with bed alarm set;with family/visitor present (Pillows under bony prominences)           Time: 1345-1430 PT Time Calculation (min) (ACUTE ONLY): 45 min   Charges:   PT Evaluation $PT Eval Moderate Complexity: 1 Procedure PT Treatments $Therapeutic Activity: 8-22 mins   PT G Codes:       Lyndel Safe Kwanza Cancelliere PT, DPT   Tamaka Sawin 11/04/2015, 4:22 PM

## 2015-11-04 NOTE — Progress Notes (Signed)
OT Cancellation Note  Patient Details Name: Cristina [name] MRN: [id number] DOB: [birth year]   Cancelled Treatment:    Reason Eval/Treat Not Completed: Other (comment) (Pt. with elevated troponin.) Will attempt eval as appropriate.  Harrel Carina, MS, OTR/L   Harrel Carina 11/04/2015, 10:43 AM

## 2015-11-04 NOTE — NC FL2 (Signed)
  [place] LEVEL OF CARE SCREENING TOOL     IDENTIFICATION  Patient Name: Cristina [name] Birthdate: October 05, 1923 Sex: female Admission Date (Current Location): 11/03/2015  [place] and [place] Number:  Engineering geologist and Address:  [facility], [address], [place], [place] [address]      Provider Number: [id number]  Attending Physician Name and Address:  Gladstone Lighter, MD  Relative Name and Phone Number:       Current Level of Care: Hospital Recommended Level of Care: [place] Prior Approval Number:    Date Approved/Denied:   PASRR Number:  ([id number])  Discharge Plan: SNF    Current Diagnoses: Patient Active Problem List   Diagnosis Date Noted  . CVA (cerebral infarction) 11/03/2015    Orientation RESPIRATION BLADDER Height & Weight      (Unable to Access )  O2 (Nasal Cannula 2 (L/min)) Incontinent Weight: 127 lb 8 oz (57.834 kg) Height:     BEHAVIORAL SYMPTOMS/MOOD NEUROLOGICAL BOWEL NUTRITION STATUS   (None)  (None) Incontinent Diet  AMBULATORY STATUS COMMUNICATION OF NEEDS Skin   Extensive Assist Non-Verbally Normal                       Personal Care Assistance Level of Assistance  Bathing, Feeding, Dressing Bathing Assistance: Limited assistance Feeding assistance: Limited assistance Dressing Assistance: Limited assistance     Functional Limitations Info  Sight, Hearing, Speech Sight Info: Adequate Hearing Info: Adequate Speech Info: Impaired (Non-Verbal )    SPECIAL CARE FACTORS FREQUENCY  PT (By licensed PT), OT (By licensed OT), Speech therapy     PT Frequency:  (5) OT Frequency:  (5)     Speech Therapy Frequency:  (5)      Contractures      Additional Factors Info  Code Status, Allergies Code Status Info:  (Full Code) Allergies Info:  (No Known Allergies )           Current Medications (11/04/2015):  This is the current hospital active medication  list Current Facility-Administered Medications  Medication Dose Route Frequency Provider Last Rate Last Dose  . 0.9 %  sodium chloride infusion   Intravenous Continuous Gladstone Lighter, MD 60 mL/hr at 11/03/15 1946    . acetaminophen (TYLENOL) tablet 650 mg  650 mg Oral Q6H PRN Gladstone Lighter, MD       Or  . acetaminophen (TYLENOL) suppository 650 mg  650 mg Rectal Q6H PRN Gladstone Lighter, MD      . aspirin suppository 300 mg  300 mg Rectal Daily Gladstone Lighter, MD   300 mg at 11/03/15 1709  . enoxaparin (LOVENOX) injection 30 mg  30 mg Subcutaneous Q24H Crystal G Scarpena, RPH      . sodium chloride flush (NS) 0.9 % injection 3 mL  3 mL Intravenous Q12H Gladstone Lighter, MD   3 mL at 11/03/15 2209     Discharge Medications: Please see discharge summary for a list of discharge medications.  Relevant Imaging Results:  Relevant Lab Results:   Additional Information  (SSN [id number])  Lorenso Quarry Sergio Hobart, LCSW

## 2015-11-04 NOTE — Care Management Note (Signed)
Case Management Note  Patient Details  Name: Cristina [name] MRN: [id number] Date of Birth: 07/05/1924  Subjective/Objective:  PT consult pending. SW to meet with family and discuss SNF. Will follow up if home health needs arise.                   Action/Plan: Case closed at this time  Expected Discharge Date:                  Expected Discharge Plan:  [place]  In-House Referral:  Clinical Social Work  Discharge planning Services  CM Consult  Post Acute Care Choice:    Choice offered to:     DME Arranged:    DME Agency:     HH Arranged:    [place] Agency:     Status of Service:  Completed, signed off  Medicare Important Message Given:    Date Medicare IM Given:    Medicare IM give by:    Date Additional Medicare IM Given:    Additional Medicare Important Message give by:     If discussed at [place] of Stay Meetings, dates discussed:    Additional Comments:  Jolly Mango, RN 11/04/2015, 10:18 AM

## 2015-11-04 NOTE — Progress Notes (Signed)
[place] [facility] [place] NAME: Cristina [name]    MR#:  [id number]  DATE OF BIRTH:  1924-03-03  SUBJECTIVE:  CHIEF COMPLAINT:   Chief Complaint  Patient presents with  . Aphasia  . Weakness   - Patient is more alert and trying to interact, but aphasic now, making sounds, appropriately responding. Right arm weakness better than yesterday. Swallow study is pending  REVIEW OF SYSTEMS:  Review of Systems  Unable to perform ROS: patient nonverbal    DRUG ALLERGIES:  No Known Allergies  VITALS:  Blood pressure 108/75, pulse 82, temperature 98 F (36.7 C), temperature source Oral, resp. rate 20, weight 57.834 kg (127 lb 8 oz), SpO2 95 %.  PHYSICAL EXAMINATION:  Physical Exam  GENERAL: 82 y.o.-year-old elderly patient lying in the bed with no acute distress.  EYES: Pupils equal, round, reactive to light and accommodation. No scleral icterus. Extraocular muscles intact.  HEENT: Head atraumatic, normocephalic. Oropharynx and nasopharynx clear. Dry mucus membranes. No obvious facial droop noted. NECK: Supple, no jugular venous distention. No thyroid enlargement, no tenderness.  LUNGS: Normal breath sounds bilaterally, no wheezing, rales,rhonchi or crepitation. No use of accessory muscles of respiration. Decreased bibasilar breath sounds CARDIOVASCULAR: S1, S2 normal. No rubs, or gallops. 3/6 systolic murmur present. ABDOMEN: Soft, nontender, nondistended. Bowel sounds present. No organomegaly or mass.  EXTREMITIES: No cyanosis, or clubbing. 2+ bilateral pedal edema present. NEUROLOGIC: Expressive aphasia noted.  Right UE strength is 3/5, left is 5/5, more proximal muscle weakness noted. Bilateral lower extremity weakness - RLE 2/5, LLE 3/5 strength. Sensation intact. Gait not checked.  FOLLOWING COMMANDS  PSYCHIATRIC: The patient is alert and following commands.  SKIN: No obvious rash, lesion, or ulcer.   LABORATORY PANEL:    CBC  Recent Labs Lab 11/04/15 0518  WBC 5.8  HGB 12.1  HCT 36.5  PLT 162   ------------------------------------------------------------------------------------------------------------------  Chemistries   Recent Labs Lab 11/03/15 1427 11/04/15 0518  NA 141 142  K 4.5 4.0  CL 101 105  CO2 33* 29  GLUCOSE 105* 101*  BUN 33* 30*  CREATININE 1.40* 1.16*  CALCIUM 9.1 8.8*  AST 45*  --   ALT 19  --   ALKPHOS 64  --   BILITOT 0.6  --    ------------------------------------------------------------------------------------------------------------------  Cardiac Enzymes  Recent Labs Lab 11/04/15 0518  TROPONINI 0.19*   ------------------------------------------------------------------------------------------------------------------  RADIOLOGY:  Dg Chest 1 View  11/03/2015  CLINICAL DATA:  Altered mental status, atrial fibrillation, congestive heart failure EXAM: CHEST 1 VIEW COMPARISON:  07/06/2015 FINDINGS: Cardiomegaly again noted. Central mild vascular congestion without convincing pulmonary edema. There is small left pleural effusion. Bilateral basilar atelectasis or infiltrate left greater than right. IMPRESSION: Cardiomegaly. Central vascular congestion without convincing pulmonary edema. Small left pleural effusion. Bilateral basilar atelectasis or infiltrate left greater than right. Electronically Signed   By: Lahoma Crocker M.D.   On: 11/03/2015 14:12   Ct Head Wo Contrast  11/03/2015  CLINICAL DATA:  Dysphasia EXAM: CT HEAD WITHOUT CONTRAST TECHNIQUE: Contiguous axial images were obtained from the base of the skull through the vertex without intravenous contrast. COMPARISON:  July 06, 2015 FINDINGS: Mild diffuse atrophy is stable. There has been interval resolution of hemorrhage from the left frontal lobe. Currently, no hemorrhage is evident on this study. There is no apparent mass, midline shift, or extra-axial fluid collection. There is mild patchy small vessel  disease in the centra semiovale bilaterally. No acute infarct evident.  The bony calvarium appears intact. Visualized mastoid air cells are clear. No intraorbital lesions are evident. Small foci of basal ganglia calcification remain stable and are felt to be physiologic in this age group. IMPRESSION: Mild atrophy with mild patchy periventricular small vessel disease. Prior left frontal lobe hemorrhage has resolved. Currently there is no hemorrhage. There is no demonstrable mass or extra-axial fluid collection. No acute infarct evident. Electronically Signed   By: Lowella Grip III M.D.   On: 11/03/2015 14:15   Mr Brain Wo Contrast  11/03/2015  CLINICAL DATA:  Stroke. Atrial fibrillation. Altered mental status. Weak. EXAM: MRI HEAD WITHOUT CONTRAST TECHNIQUE: Multiplanar, multiecho pulse sequences of the brain and surrounding structures were obtained without intravenous contrast. COMPARISON:  CT head 11/03/2015. FINDINGS: Small sub cm area of acute infarct in the left temporoparietal lobe. Additional possible area of acute infarct in the left insula. This could represent T2 shine through versus recent infarction. Mild atrophy.  Ventricular enlargement consistent with atrophy. Negative for intracranial hemorrhage. Mild chronic microvascular ischemic changes in the white matter. Brainstem and cerebellum normal. Negative for mass or edema.  No shift of the midline structures. Paranasal sinuses clear. Skull base normal. Pituitary normal in size. IMPRESSION: Small sub cm area of acute infarct in the left temporoparietal lobe. Possible acute infarct in the left insula. Atrophy and mild chronic microvascular ischemia Electronically Signed   By: Franchot Gallo M.D.   On: 11/03/2015 17:50   US Carotid Bilateral  11/04/2015  CLINICAL DATA:  Stroke EXAM: BILATERAL CAROTID DUPLEX ULTRASOUND TECHNIQUE: Pearline Cables scale imaging, color Doppler and duplex ultrasound was performed of bilateral carotid and vertebral arteries in the  neck. COMPARISON:  07/24/2003 by report only REVIEW OF SYSTEMS: Quantification of carotid stenosis is based on velocity parameters that correlate the residual internal carotid diameter with NASCET-based stenosis levels, using the diameter of the distal internal carotid lumen as the denominator for stenosis measurement. The following velocity measurements were obtained: PEAK SYSTOLIC/END DIASTOLIC RIGHT ICA:                     81/16cm/sec CCA:                     [id number] SYSTOLIC ICA/CCA RATIO:  1.0 DIASTOLIC ICA/CCA RATIO: 1.1 ECA:                     98cm/sec LEFT ICA:                     90/24cm/sec CCA:                     [id number] SYSTOLIC ICA/CCA RATIO:  1.0 DIASTOLIC ICA/CCA RATIO: 1.3 ECA:                     94cm/sec FINDINGS: RIGHT CAROTID ARTERY: Mild eccentric partially calcified plaque in the bulb. No high-grade stenosis. Normal waveforms and color Doppler signal. RIGHT VERTEBRAL ARTERY:  Normal flow direction and waveform. LEFT CAROTID ARTERY: Intimal thickening in the bulb. No focal plaque accumulation or stenosis. Normal waveforms and color Doppler signal. LEFT VERTEBRAL ARTERY: Normal flow direction and waveform. IMPRESSION: 1. Mild right carotid bulb plaque resulting in less than 50% diameter stenosis. The exam does not exclude plaque ulceration or embolization. Continued surveillance recommended. Electronically Signed   By: Lucrezia Europe M.D.   On: 11/04/2015 09:31    EKG:   Orders placed or performed during  the hospital encounter of 11/03/15  . ED EKG  . ED EKG  . EKG 12-Lead  . EKG 12-Lead    ASSESSMENT AND PLAN:   Cristina [name] is a 82 y.o. female with a known history of atrial fibrillation, congestive heart failure with diastolic dysfunction, questionable history of atrial septal defect, severe mitral regurgitation, hypertension and head trauma in November 2016 after a fall resulting in left frontal hemorrhage presents to the hospital secondary to slurred speech.  #1  Acute CVA- MRI with acute infarct in left temporoparietal lobe and also in the left insula. Likely embolic in nature from her A. fib. -Carotid Dopplers showing possibly block and right carotid bulb with no obstruction.Marland Kitchen Aphasic and also right-sided weakness noted. -Neurology consulted.  -Echo with moderate pulmonary hypertension, dilated ventricles, normal EF noted. No comments on bubble study noted. -on rectal aspirin as failed her swallow study. Statin can be started if she is able to swallow. -LDL of 61 -Physical therapy, occupational therapy and speech therapy consults ordered. -Patient is high risk for falls as her last fall in November 2016 resulted in left frontal hemorrhage which has resolved based on her recent CT. Neurology recommended aspirin and Plavix and no further anticoagulation at this time due to risk of intracranial hemorrhage. -CODE STATUS discussed with daughter and also husband at bedside. Full code at this time.  #2 acute renal failure-  ATN. Improving with hydration. -Gentle hydration. Hold nephrotoxins. Continue to monitor closely.  #3 diastolic CHF- echo with EF 55% but severe mitral and tricuspid regurgitation and diastolic dysfunction. -Lasix twice a day has been started as outpatient due to worsening lower extremity edema. However appears clinically dry, hold Lasix. -Compression stockings and keep the legs elevated  #4 atrial fibrillation-rate controlled. Not on anticoagulation due to prior hemorrhage. -Aspirin rectally for now if unable to pass swallow screen. -Metoprolol on hold while swallow screen is pending. Vitals are stable.  #5 elevated troponin-could be demand ischemia. Patient did not have any chest pain or prior cardiac history. -Plateaued troponins. Admit to telemetry.  #6 DVT prophylaxis-started on Lovenox     All the records are reviewed and case discussed with Care Management/Social Workerr. Management plans discussed with the patient, family  and they are in agreement.  CODE STATUS: Full Code  TOTAL TIME TAKING CARE OF THIS PATIENT: 37 minutes.   POSSIBLE D/C IN 2 DAYS, DEPENDING ON CLINICAL CONDITION.   Gladstone Lighter M.D on 11/04/2015 at 1:45 PM  Between 7am to 6pm - Pager - [phone]  After 6pm go to [email or web address] - password [email or web address] [facility]  Office  [phone]  CC: Primary care physician; Glendon Axe, MD

## 2015-11-04 NOTE — Evaluation (Signed)
Clinical/Bedside Swallow Evaluation Patient Details  Name: Cristina [name] MRN: [id number] Date of Birth: Mar 03, 1924  Today's Date: 11/04/2015 Time: SLP Start Time (ACUTE ONLY): 93 SLP Stop Time (ACUTE ONLY): 1230 SLP Time Calculation (min) (ACUTE ONLY): 60 min  Past Medical History:  Past Medical History  Diagnosis Date  . Neuropathy ([place])   . Atrial fibrillation ([place])   . Congestive heart failure (HCC)     Diastolic dysfunction  . Punctate hemorrhage of left frontal lobe [facility])     after head trauma in Nov 2016  . Severe mitral regurgitation   . Osteoporosis   . Atrial septal defect   . DJD (degenerative joint disease)   . Diverticulosis    Past Surgical History:  Past Surgical History  Procedure Laterality Date  . Hip fracture surgery Left 2013  . Shoulder surgery    . Breast cyst excision    . Cataracts      Bilateral   HPI:      Assessment / Plan / Recommendation Clinical Impression  Pt appears at min. increased risk for aspiration d/t presentation w/ the po trials. Noted min. oral/lingual weakness (R) as well as min. decreased Cognitive awareness during task of eating/drinking evidenced by talking during trials. Toward end of trials and when talking, pt exhibited delayed throat clearing/cough w/ thin liquid (x1) and puree (x2) trials. Pt's cough appeared adequate to offer airway protection, however, pt is weak overall in light of recent CVA. Pt also exhibits min. R side neglect and expressive/receptive Aphasia. She requires min.-mod. cues during tasks to aid attention and follow through. She appears easily fatigued w/ exertion of concentration and increased activity. Rec. initiation of a Dys. 1 diet w/ thin liquids (pt appeared relieved to have water) w/ strict aspiration precautions and support w/ feeding during meals - pt was unable to feed self d/t RUE weakness and confusion w/ lack of use of it. Rec. meds Crushed in puree. NSG and family updated. ST will f/u tomorrow.  Rec. Sitting on R side of pt to challenge and increased awareness of environment on R side.      Aspiration Risk  Mild aspiration risk+   Diet Recommendation  Dys. 1 w/ thin liquids; strict aspiration precautions; feeding support/assist at meals. Reduce distractions and talking when eating/drinking. Rec. Sitting on R side of pt to challenge and increased awareness of environment on R side during meals.   Medication Administration: Crushed with puree    Other  Recommendations Recommended Consults:  (Dietician) Oral Care Recommendations: Oral care BID;Staff/trained caregiver to provide oral care   Follow up Recommendations  Skilled Nursing facility (TBD)    Frequency and Duration min 3x week  2 weeks       Prognosis Prognosis for Safe Diet Advancement: Good Barriers to Reach Goals: Language deficits      Swallow Study   General Date of Onset: 11/03/15 Type of Study: Bedside Swallow Evaluation Previous Swallow Assessment: none Diet Prior to this Study: Regular;Thin liquids (at home; currently NPO) Temperature Spikes Noted: No (wbc 5.8) Respiratory Status: Nasal cannula (2 liters) History of Recent Intubation: No Behavior/Cognition: Alert;Cooperative;Pleasant mood;Distractible;Requires cueing (expressive, and receptive, language deficits) Oral Cavity Assessment: Within Functional Limits (min. dry) Oral Care Completed by SLP: Yes Oral Cavity - Dentition: Adequate natural dentition (missing few molars) Vision: Functional for self-feeding (noted min. appearance of R neglect) Self-Feeding Abilities: Able to feed self;Needs assist;Needs set up Patient Positioning: Upright in bed Baseline Vocal Quality: Low vocal intensity (decreased articluation of  speech; paraphasias) Volitional Cough: Strong Volitional Swallow: Able to elicit    Oral/Motor/Sensory Function Overall Oral Motor/Sensory Function: Mild impairment Facial ROM: Reduced right (slight) Facial Symmetry: Abnormal symmetry  right (slight) Facial Strength: Reduced right (slight) Facial Sensation: Reduced right (slight) Lingual ROM: Reduced right (min. ) Lingual Symmetry: Abnormal symmetry right (min.) Lingual Strength: Within Functional Limits (grossly) Lingual Sensation: Within Functional Limits (difficult for her to answer consistently) Velum: Within Functional Limits Mandible: Within Functional Limits   Ice Chips Ice chips: Within functional limits Presentation: Spoon (fed; 3 trials) Other Comments: chewed/crunched them   Thin Liquid Thin Liquid: Impaired Presentation: Cup;Self Fed (monitored closely; ~6 ozs total) Oral Phase Impairments:  (wfl) Oral Phase Functional Implications: Right anterior spillage (slight x1) Pharyngeal  Phase Impairments: Cough - Delayed (on last trial of drinking water via cup; residue?) Other Comments: audible swallows    Nectar Thick Nectar Thick Liquid: Not tested   Honey Thick Honey Thick Liquid: Not tested   Puree Puree: Impaired Presentation: Spoon (fed; ~4 ozs total) Oral Phase Impairments:  (decreased sensation - min.) Oral Phase Functional Implications: Right anterior spillage (slight, min.) Pharyngeal Phase Impairments: Cough - Delayed (x2 when she attempted to talk while eating the trials) Other Comments: pt frequently talked and/or pharyngeal residue which may have lead to the throat clearing/cough episodes the puree trials x2   Solid   GO   Solid: Not tested       Cristina Kenner, MS, CCC-SLP  Cristina [name] 11/04/2015,5:18 PM

## 2015-11-04 NOTE — Clinical Social Work Note (Signed)
Clinical Social Work Assessment  Patient Details  Name: Cristina [name] MRN: [id number] Date of Birth: Feb 24, 1924  Date of referral:  11/04/15               Reason for consult:  Discharge Planning                Permission sought to share information with:  Family Supports Permission granted to share information::  Yes, Verbal Permission Granted  Name::        Agency::     Relationship::   ([name]- Husband )  Contact Information:     Housing/Transportation Living arrangements for the past 2 months:  [place] of Information:  Spouse, Adult Children Patient Interpreter Needed:  None Criminal Activity/Legal Involvement Pertinent to Current Situation/Hospitalization:  No - Comment as needed Significant Relationships:  Adult Children, Other Family Members, Spouse Lives with:  Spouse Do you feel safe going back to the place where you live?  Yes Need for family participation in patient care:  Yes (Comment) ([name]- Husband )  Care giving concerns:  Patient's family feels that she would need SNF placement at discharge due to patient having a stroke.    Social Worker assessment / plan:  CSW was consulted by PT stating that patient would benefit from SNF at discharge. CSW met with patient and her family at bedside (Son, Husband, and Granddaughter). CSW introduced herself and her role. CSW discussed PTs evaluation of patient's needs at discharge. Family is agreeable to SNF placement. Reports that patient has been to [facility] several times in the past. Reported that her other children will be in town later this evening and would like to review SNFs to make a facility preference. Verbal Permission granted from patient's husband to complete [facility] referral.   FL2/ PASRR completed and faxed to SNFs in [facility]. Bed offers received and presented to patient's family. Stated they'd like to discuss it more when other family arive. CSW will  continue to follow and assist.   Employment status:  Retired Forensic scientist:  Medicare PT Recommendations:  [place] / Referral to community resources:  [place]  Patient/Family's Response to care:  Patient's family are concerned about patient. Reports that they are grateful that she's show some progress. Reports that they feel that she will need SNF at discharge but would like to discuss it with other children.   Patient/Family's Understanding of and Emotional Response to Diagnosis, Current Treatment, and Prognosis:  They report that they understand CSW's role and patient's prognosis. Stated they are feeling somewhat better since patient has made some progress. Stated they appreciate CSW's assistance and would have have a decision made abut SNF placement sometime tomorrow 11-05-15.   Emotional Assessment Appearance:  Appears stated age Attitude/Demeanor/Rapport:   (None) Affect (typically observed):  Calm, Pleasant Orientation:  Oriented to Self Alcohol / Substance use:  Not Applicable Psych involvement (Current and /or in the community):  No (Comment)  Discharge Needs  Concerns to be addressed:  Discharge Planning Concerns Readmission within the last 30 days:  No Current discharge risk:  Chronically ill Barriers to Discharge:  Continued Medical Work up   [employer], LCSW 11/04/2015, 3:13 PM

## 2015-11-04 NOTE — Progress Notes (Signed)
Anticoagulation Monitoring  Patient is a 82 yo female with orders for Lovenox 40 mg subq q24h. Patient's est CrCl~27.8 mL/min based on IBW and height of 63 in. Per anticoagulation policy, will transition patient to Lovenox 30 mg subq q24h based on est CrCl<30 mL/min.  Pharmacy will continue to follow.  Murrell Converse, PharmD Clinical Pharmacist 11/04/2015

## 2015-11-05 LAB — CBC
HEMATOCRIT: 35.9 % (ref 35.0–47.0)
Hemoglobin: 12 g/dL (ref 12.0–16.0)
MCH: 29.5 pg (ref 26.0–34.0)
MCHC: 33.4 g/dL (ref 32.0–36.0)
MCV: 88.3 fL (ref 80.0–100.0)
PLATELETS: 149 10*3/uL — AB (ref 150–440)
RBC: 4.06 MIL/uL (ref 3.80–5.20)
RDW: 16.4 % — AB (ref 11.5–14.5)
WBC: 6.1 10*3/uL (ref 3.6–11.0)

## 2015-11-05 LAB — BASIC METABOLIC PANEL
ANION GAP: 5 (ref 5–15)
BUN: 30 mg/dL — ABNORMAL HIGH (ref 6–20)
CALCIUM: 8.5 mg/dL — AB (ref 8.9–10.3)
CO2: 29 mmol/L (ref 22–32)
CREATININE: 1.03 mg/dL — AB (ref 0.44–1.00)
Chloride: 106 mmol/L (ref 101–111)
GFR calc Af Amer: 54 mL/min — ABNORMAL LOW (ref >60)
GFR, EST NON AFRICAN AMERICAN: 46 mL/min — AB (ref >60)
GLUCOSE: 102 mg/dL — AB (ref 65–99)
Potassium: 3.8 mmol/L (ref 3.5–5.1)
Sodium: 140 mmol/L (ref 135–145)

## 2015-11-05 MED ORDER — METOPROLOL TARTRATE 50 MG PO TABS
50.0000 mg | ORAL_TABLET | Freq: Two times a day (BID) | ORAL | Status: DC
  Administered 2015-11-05 – 2015-11-06 (×3): 50 mg via ORAL
  Filled 2015-11-05 (×3): qty 1

## 2015-11-05 MED ORDER — METOPROLOL TARTRATE 1 MG/ML IV SOLN: 5.0000 mg | INTRAVENOUS | Status: DC | PRN

## 2015-11-05 NOTE — Clinical Documentation Improvement (Signed)
Internal Medicine  Can the diagnosis of Atrial Fibrillation be further specified? Please document findings in next progress note. Thank you!   Chronic Atrial fibrillation  Paroxysmal Atrial fibrillation  Permanent Atrial fibrillation  Persistent Atrial fibrillation  Other  Clinically Undetermined  Document any associated diagnoses/conditions.  Supporting Information:  "atrial fibrillation-rate controlled. Not on anticoagulation due to prior hemorrhage" documented in 11/04/15 progress note  Please exercise your independent, professional judgment when responding. A specific answer is not anticipated or expected.  Thank You,  Zoila Shutter RN, BSN, [place] [phone]; Cell: [phone]

## 2015-11-05 NOTE — Care Management Important Message (Signed)
Important Message  Patient Details  Name: Cristina [name] MRN: [id number] Date of Birth: 03-04-1924   Medicare Important Message Given:  Yes    Juliann Pulse A Donal Lynam 11/05/2015, 1:06 PM

## 2015-11-05 NOTE — Progress Notes (Signed)
CSW met with patient and her husband at bedside. Accepted a bed at [facility]. CSW left a voicemail for [employer] at [facility] to inform her of above. CSW will continue to follow and assist.   Ernest Pine, MSW, [place] Work Department [phone]

## 2015-11-05 NOTE — Progress Notes (Signed)
Physical Therapy Treatment Patient Details Name: Cristina [name] MRN: [id number] DOB: 16-Mar-1924 Today's Date: 11/05/2015    History of Present Illness Cristina [name] is an 82 y.o. female with a known history of atrial fibrillation, congestive heart failure with diastolic dysfunction, questionable history of atrial septal defect, severe mitral regurgitation, hypertension and head trauma in November 2016 after a fall resulting in left frontal hemorrhage presents to the hospital unable to speak intelligibly.  Patient unable to provide any history due to speech and all history obtained from family.  Patient awakened yesterday morning and was not speaking to her husband.  He felt that she just did not feel well but when she spoke on the phone to her daughter there were only mumbles.  EMS was called at that time.  It appears that she was talking during transportation and answering simple questions but with the slurred speech. While in the emergency room, patient became nonverbal. Right sided weakness was noted as well.  Patient went to bed the night before and was normal at that time. Initial NIHSS of 6. At baseline patient is able to handle all of her own ADL's. Assist for IADLs. Pt uses a walker vs "furniture walking" around the house. She hasn't been on any anticoagulation due to her left frontal hemorrhage after her fall in November 2016. Other than the one fall in November family denies any other falls in the last 12 months. History is primarily provided by patient's sons. One of her sons states that family wishes patient to be "comfortable in her own home." Family clearly overwhelmed by the current situation. Pt unable to provide meaningful history at this time. She does appear to provide some appropriate one word responses to questioning as well as head nods. She follows commands well but has difficulty expressing wishes/thoughts. Unclear if patient is currently experiencing any confusion or simply  aphasia. Head nods are not always correct to simple questions regarding orientation.     PT Comments    Pt demonstrating significant progress today in all areas. Pt following instructions well, improved ability to communicate verbally and recognizes when speech is less than comprehensible; pt will stop and retry mostly successfully. Pt requiring less assist with bed mobility and transfers from Mod to [place]. Pt requires cueing/assist for safe use of rolling walker seemingly somewhat unfamiliar with its use. Family notes pt has one, but does not often use. Encouraged use for increased safety at this time. Pt demonstrates improved static stand ability/tolerance and is able to ambulate around room to chair without loss of balance. Pt comfortable in chair and encouraged up in chair for as long as tolerated. O2 removed while seated in chair for several minutes with slow desaturation to 90-91%. O2 replaced. Encouraged/educaed pt/family on working on deep breathing and use of incentive spirometer, which nursing is to provide patient. Continue PT to progress strength, endurance, safety and balance to improve functional mobility. .  Follow Up Recommendations  SNF     Equipment Recommendations  None recommended by PT;Other (comment)    Recommendations for Other Services       Precautions / Restrictions Precautions Precautions: Fall Restrictions Weight Bearing Restrictions: No    Mobility  Bed Mobility Overal bed mobility: Needs Assistance Bed Mobility: Supine to Sit     Supine to sit: Min assist     General bed mobility comments: Improved, pt participating well using raild and edge of bed to advance self. Min A primarily for scooting hips to edge  of bed; less so to elevate trunk upright  Transfers Overall transfer level: Needs assistance Equipment used: Rolling walker (2 wheeled) Transfers: Sit to/from Stand Sit to Stand: Min assist         General transfer comment: cues for hand  placement; evident that pt somewhat unfamiliar with using rw. Family notes she has one at home, but does not often use  Ambulation/Gait Ambulation/Gait assistance: Min assist;Min guard Ambulation Distance (Feet): 20 Feet Assistive device: Rolling walker (2 wheeled) Gait Pattern/deviations: Step-through pattern;Decreased stride length Gait velocity: slow Gait velocity interpretation: <1.8 ft/sec, indicative of risk for recurrent falls General Gait Details: Assist to manuever rw; Min guard for balance   Stairs            Wheelchair Mobility    Modified Rankin (Stroke Patients Only)       Balance Overall balance assessment: Needs assistance Sitting-balance support: Feet supported;Bilateral upper extremity supported;Single extremity supported Sitting balance-Leahy Scale: Fair Sitting balance - Comments: slight lean left, but maintains without LOB and 2 episods of gradual lean posteriorl requiring Min A to correct. Pt able to maintain seated posture throughout edge of bed exercises Postural control: Left lateral lean;Posterior lean (occasional posterior) Standing balance support: Bilateral upper extremity supported Standing balance-Leahy Scale: Fair Standing balance comment: Tolerates stand with Min guard and rw x 5 minutes with personal hygiened                     Cognition Arousal/Alertness: Awake/alert Behavior During Therapy: WFL for tasks assessed/performed;Restless Overall Cognitive Status: Difficult to assess                      Exercises General Exercises - Lower Extremity Long Arc Quad: AROM;Both;20 reps;Seated (2 sets of 10) Hip Flexion/Marching: AROM;Both;20 reps;Seated Toe Raises: AROM;Both;20 reps;Seated Heel Raises: AROM;Both;20 reps;Seated    General Comments        Pertinent Vitals/Pain Pain Assessment: No/denies pain    Home Living                      Prior Function            PT Goals (current goals can now be  found in the care plan section) Progress towards PT goals: Progressing toward goals    Frequency  7X/week    PT Plan Current plan remains appropriate    Co-evaluation             End of Session Equipment Utilized During Treatment: Gait belt;Oxygen Activity Tolerance: Patient tolerated treatment well;Patient limited by fatigue (Initial lightheadedness with sit; slowly descreases/subsides) Patient left: in chair;with call bell/phone within reach;with chair alarm set;with family/visitor present;with nursing/sitter in room     Time: 1110-1139 PT Time Calculation (min) (ACUTE ONLY): 29 min  Charges:  $Gait Training: 8-22 mins $Therapeutic Exercise: 8-22 mins                    G Codes:      Charlaine Dalton, PTA 11/05/2015, 11:57 AM

## 2015-11-05 NOTE — Progress Notes (Signed)
Patient ID: Cristina [name], female   DOB: 1924/01/21, 82 y.o.   MRN: [id number] [facility] PROGRESS NOTE  Cristina [name] [id number] DOB: 1924-04-23 DOA: 11/03/2015 PCP: Glendon Axe, MD  HPI/Subjective: Patient able to talk a little bit better. Frustrated that she is unable to get her words out fast enough. Her strength feels good on both sides. Swallowing okay. Called that she was in rapid atrial fibrillation about 120 bpm earlier this morning.  Objective: Filed Vitals:   11/05/15 0855 11/05/15 1110  BP: 112/65   Pulse: 106 98  Temp:    Resp:      Filed Weights   11/03/15 2101  Weight: 57.834 kg (127 lb 8 oz)    ROS: Review of Systems  Constitutional: Negative for fever and chills.  Eyes: Negative for blurred vision.  Respiratory: Negative for cough and shortness of breath.   Cardiovascular: Negative for chest pain.  Gastrointestinal: Negative for nausea, vomiting, abdominal pain, diarrhea and constipation.  Genitourinary: Negative for dysuria.  Musculoskeletal: Negative for joint pain.  Neurological: Positive for speech change. Negative for dizziness and headaches.   Exam: Physical Exam  HENT:  Nose: No mucosal edema.  Mouth/Throat: No oropharyngeal exudate or posterior oropharyngeal edema.  Eyes: Conjunctivae, EOM and lids are normal. Pupils are equal, round, and reactive to light.  Neck: No JVD present. Carotid bruit is not present. No edema present. No thyroid mass and no thyromegaly present.  Cardiovascular: S1 normal and S2 normal.  An irregularly irregular rhythm present. Exam reveals no gallop.   No murmur heard. Pulses:      Dorsalis pedis pulses are 2+ on the right side, and 2+ on the left side.  Respiratory: No respiratory distress. She has no wheezes. She has no rhonchi. She has no rales.  GI: Soft. Bowel sounds are normal. There is no tenderness.  Musculoskeletal:       Right ankle: She exhibits no swelling.       Left ankle: She  exhibits no swelling.  Lymphadenopathy:    She has no cervical adenopathy.  Neurological: She is alert.  Patient with slurred speech. She is able to speak better than when she came in. She is very slow with her speech. Power 5 out of 5 upper and lower extremities.  Skin: Skin is warm. Nails show no clubbing.  Bruising seen on arms.  Psychiatric: She has a normal mood and affect.      Data Reviewed: Basic Metabolic Panel:  Recent Labs Lab 11/03/15 1427 11/04/15 0518 11/05/15 0705  NA 141 142 140  K 4.5 4.0 3.8  CL 101 105 106  CO2 33* 29 29  GLUCOSE 105* 101* 102*  BUN 33* 30* 30*  CREATININE 1.40* 1.16* 1.03*  CALCIUM 9.1 8.8* 8.5*   Liver Function Tests:  Recent Labs Lab 11/03/15 1427  AST 45*  ALT 19  ALKPHOS 64  BILITOT 0.6  PROT 6.8  ALBUMIN 3.7    Recent Labs Lab 11/03/15 2346  LIPASE 15   CBC:  Recent Labs Lab 11/03/15 1032 11/04/15 0518 11/05/15 0705  WBC 4.2 5.8 6.1  NEUTROABS 2.7  --   --   HGB 13.1 12.1 12.0  HCT 40.0 36.5 35.9  MCV 89.4 88.2 88.3  PLT 170 162 149*   Cardiac Enzymes:  Recent Labs Lab 11/03/15 1427 11/03/15 1622 11/03/15 2346 11/04/15 0518  TROPONINI 0.24* 0.24* 0.20* 0.19*     Studies: Mr Brain Wo Contrast  11/03/2015  CLINICAL DATA:  Stroke.  Atrial fibrillation. Altered mental status. Weak. EXAM: MRI HEAD WITHOUT CONTRAST TECHNIQUE: Multiplanar, multiecho pulse sequences of the brain and surrounding structures were obtained without intravenous contrast. COMPARISON:  CT head 11/03/2015. FINDINGS: Small sub cm area of acute infarct in the left temporoparietal lobe. Additional possible area of acute infarct in the left insula. This could represent T2 shine through versus recent infarction. Mild atrophy.  Ventricular enlargement consistent with atrophy. Negative for intracranial hemorrhage. Mild chronic microvascular ischemic changes in the white matter. Brainstem and cerebellum normal. Negative for mass or edema.  No  shift of the midline structures. Paranasal sinuses clear. Skull base normal. Pituitary normal in size. IMPRESSION: Small sub cm area of acute infarct in the left temporoparietal lobe. Possible acute infarct in the left insula. Atrophy and mild chronic microvascular ischemia Electronically Signed   By: Franchot Gallo M.D.   On: 11/03/2015 17:50   US Carotid Bilateral  11/04/2015  CLINICAL DATA:  Stroke EXAM: BILATERAL CAROTID DUPLEX ULTRASOUND TECHNIQUE: Pearline Cables scale imaging, color Doppler and duplex ultrasound was performed of bilateral carotid and vertebral arteries in the neck. COMPARISON:  07/24/2003 by report only REVIEW OF SYSTEMS: Quantification of carotid stenosis is based on velocity parameters that correlate the residual internal carotid diameter with NASCET-based stenosis levels, using the diameter of the distal internal carotid lumen as the denominator for stenosis measurement. The following velocity measurements were obtained: PEAK SYSTOLIC/END DIASTOLIC RIGHT ICA:                     81/16cm/sec CCA:                     [id number] SYSTOLIC ICA/CCA RATIO:  1.0 DIASTOLIC ICA/CCA RATIO: 1.1 ECA:                     98cm/sec LEFT ICA:                     90/24cm/sec CCA:                     [id number] SYSTOLIC ICA/CCA RATIO:  1.0 DIASTOLIC ICA/CCA RATIO: 1.3 ECA:                     94cm/sec FINDINGS: RIGHT CAROTID ARTERY: Mild eccentric partially calcified plaque in the bulb. No high-grade stenosis. Normal waveforms and color Doppler signal. RIGHT VERTEBRAL ARTERY:  Normal flow direction and waveform. LEFT CAROTID ARTERY: Intimal thickening in the bulb. No focal plaque accumulation or stenosis. Normal waveforms and color Doppler signal. LEFT VERTEBRAL ARTERY: Normal flow direction and waveform. IMPRESSION: 1. Mild right carotid bulb plaque resulting in less than 50% diameter stenosis. The exam does not exclude plaque ulceration or embolization. Continued surveillance recommended. Electronically Signed    By: Lucrezia Europe M.D.   On: 11/04/2015 09:31    Scheduled Meds: . aspirin EC  325 mg Oral Daily  . atorvastatin  40 mg Oral q1800  . clopidogrel  75 mg Oral Daily  . enoxaparin (LOVENOX) injection  30 mg Subcutaneous Q24H  . metoprolol tartrate  50 mg Oral BID  . sodium chloride flush  3 mL Intravenous Q12H   Continuous Infusions:   Assessment/Plan:  1. Acute embolic CVA with aphasia. Patient on aspirin and Plavix and statin. Neurology hesitant about full anticoagulation with recent hemorrhagic CVA. Patient is speaking a little bit better at this point. Potentially out to rehabilitation tomorrow. 2.  Atrial fibrillation  with rapid ventricular response. Restart metoprolol. 3. Essential hypertension. Restarted metoprolol 4. Hyperlipidemia unspecified continue atorvastatin. LDL 61 5. Elevated troponin likely demand ischemia with stroke   Code Status:     Code Status Orders        Start     Ordered   11/03/15 1848  Full code   Continuous     11/03/15 1847    Code Status History    Date Active Date Inactive Code Status Order ID Comments User Context   This patient has a current code status but no historical code status.     Family Communication: daughter at bedside Disposition Plan: potentially out to rehabilitation tomorrow  Consultants:  Neurology  Time spent: 25 minutes  Loletha Grayer  [facility]

## 2015-11-05 NOTE — Progress Notes (Signed)
Patient having some expressive aphasia but attempting to communicate more and more throughout the day.  Up to the chair for all meals.  Family at bedside.  No complaints of pain. .  Afib

## 2015-11-05 NOTE — Progress Notes (Signed)
Speech Language Pathology Treatment: Dysphagia  Patient Details Name: Cristina [name] MRN: [id number] DOB: 06/21/1924 Today's Date: 11/05/2015 Time: 1000-1035 SLP Time Calculation (min) (ACUTE ONLY): 35 min  Assessment / Plan / Recommendation Clinical Impression  Pt appears to be tolerating her current diet of Dys. 1(purees) w/ thin liquids - cup drinking, NO straws; no overt s/s of aspiration noted by pt/family/staff w/ po's. Pt and family are using aspiration precautions and pt is holding cup to feed self w/ support as Delma Post. Pt continues to present w/ fatigue w/ increased exertion and benefits from rest breaks during po's. Thorough education discussed and given on aspiration precautions; diet consistency and prep, foods options; timeframe for diet consistency upgrade and food options. Pt and family stated she was not sleeping at night - rec. Discussing w/ MD to address further as this can impact ability to participate in Rehab. Pt's expressive language abilities have improved somewhat today, and she is able to produce short phrases given time. Briefly discussed expressive language strategies to aid verbal communication and word recall - f/u w/ formal Aphasia assessment tomorrow as pt is more alert/feels better today. Family is encouraged w/ pt's presentation today. ST will f/u tomorrow. NSG updated.     HPI        SLP Plan  Continue with current plan of care; rec. continue w/ current diet of Dys. 1 w/ thin liquids to maximize oral intake w/ less effort w/ trials to upgrade consistency next 1-2 days; f/u w/ Aphasia evaluation next 1-2 days.     Recommendations  Diet recommendations: Dysphagia 1 (puree);Thin liquid Liquids provided via: Cup;No straw Medication Administration: Crushed with puree Supervision: Staff to assist with self feeding;Intermittent supervision to cue for compensatory strategies Compensations: Minimize environmental distractions;Slow rate;Small sips/bites (rest breaks as  needed to avoid fatigue) Postural Changes and/or Swallow Maneuvers: Seated upright 90 degrees             General recommendations:  (Dietician consult) Oral Care Recommendations: Oral care BID;Staff/trained caregiver to provide oral care Follow up Recommendations: Skilled Nursing facility Plan: Continue with current plan of care     [place], [place], CCC-SLP  [name],[name] 11/05/2015, 3:28 PM

## 2015-11-06 ENCOUNTER — Encounter
Admission: RE | Admit: 2015-11-06 | Discharge: 2015-11-06 | Disposition: A | Discharge status: Home / Self Care | Payer: Medicare Other | Source: Ambulatory Visit | Attending: Internal Medicine | Admitting: Internal Medicine

## 2015-11-06 MED ORDER — FUROSEMIDE 20 MG PO TABS: 20.0000 mg | ORAL_TABLET | Freq: Every day | ORAL | Status: AC

## 2015-11-06 MED ORDER — ATORVASTATIN CALCIUM 40 MG PO TABS: 40.0000 mg | ORAL_TABLET | Freq: Every day | ORAL | Status: AC

## 2015-11-06 MED ORDER — METOPROLOL TARTRATE 50 MG PO TABS: 50.0000 mg | ORAL_TABLET | Freq: Two times a day (BID) | ORAL | Status: AC

## 2015-11-06 MED ORDER — CLOPIDOGREL BISULFATE 75 MG PO TABS: 75.0000 mg | ORAL_TABLET | Freq: Every day | ORAL | Status: DC

## 2015-11-06 NOTE — Discharge Summary (Signed)
Cristina [name] at [place] NAME: Cristina [name]    MR#:  [id number]  DATE OF BIRTH:  [birth year]  DATE OF ADMISSION:  11/03/2015 ADMITTING PHYSICIAN: Gladstone Lighter, MD  DATE OF DISCHARGE: 11/06/2015  PRIMARY CARE PHYSICIAN: Singh,Jasmine, MD    ADMISSION DIAGNOSIS:  CVA (cerebral infarction) [I63.9]  DISCHARGE DIAGNOSIS:  Active Problems:   CVA (cerebral infarction)   SECONDARY DIAGNOSIS:   Past Medical History  Diagnosis Date  . Neuropathy ([place])   . Atrial fibrillation ([place])   . Congestive heart failure (H[facility])     Diastolic dysfunction  . Punctate hemorrhage of left frontal lobe [facility])     after head trauma in Nov 2016  . Severe mitral regurgitation   . Osteoporosis   . Atrial septal defect   . DJD (degenerative joint disease)   . Diverticulosis     HOSPITAL COURSE:   1. Acute embolic stroke with aphasia. Since the patient had recent punctate hemorrhage stroke, neurology was hesitant on starting a major blood thinner. Patient had been prescribed aspirin and Plavix. Patient was weak with physical therapy and they recommended rehabilitation. Patient also had some dysphagia and put on pured diet with thin liquids. Continue to follow-up with physical therapy at rehabilitation. Continued follow-up with speech therapy at rehabilitation. Statin prescribed. 2. Atrial fibrillation rapid ventricular response. Patient placed back on her metoprolol when she went into rapid atrial fibrillation. Because of fall risk and recent hemorrhagic stroke we are not giving major anticoagulation. Patient will be on aspirin and Plavix to try to prevent stroke. 3. Lower extremity edema patient was on Lasix as outpatient. Her swelling has gotten better with teds and sequential compression devices. Can continue that at rehabilitation if able to do so. Can restart Lasix 20 mg daily. 4. Neuropathy. Gabapentin was held 5. History of congestive heart  failure diastolic. No signs of heart failure on this hospital stay. Restart low-dose Lasix as outpatient. 6. History of osteoporosis 7. Elevated troponin demand ischemia from stroke 8. Chronic kidney disease stage III. Creatinine has improved from admission  DISCHARGE CONDITIONS:   Fair  CONSULTS OBTAINED:  Treatment Team:  Alexis Goodell, MD  DRUG ALLERGIES:  No Known Allergies  DISCHARGE MEDICATIONS:   Current Discharge Medication List    START taking these medications   Details  atorvastatin (LIPITOR) 40 MG tablet Take 1 tablet (40 mg total) by mouth daily at 6 PM.    clopidogrel (PLAVIX) 75 MG tablet Take 1 tablet (75 mg total) by mouth daily.    metoprolol (LOPRESSOR) 50 MG tablet Take 1 tablet (50 mg total) by mouth 2 (two) times daily.      CONTINUE these medications which have CHANGED   Details  furosemide (LASIX) 20 MG tablet Take 1 tablet (20 mg total) by mouth daily. Qty: 30 tablet      CONTINUE these medications which have NOT CHANGED   Details  aspirin EC 325 MG tablet Take 325 mg by mouth daily.    Multiple Vitamin (MULTIVITAMIN WITH MINERALS) TABS tablet Take 1 tablet by mouth daily.    vitamin B-12 (CYANOCOBALAMIN) 1000 MCG tablet Take 1,000 mcg by mouth daily.      STOP taking these medications     gabapentin (NEURONTIN) 300 MG capsule      metoprolol succinate (TOPROL-XL) 25 MG 24 hr tablet      metoprolol succinate (TOPROL-XL) 50 MG 24 hr tablet      potassium chloride (K-DUR,KLOR-CON) 10 MEQ  tablet          DISCHARGE INSTRUCTIONS:   Follow up with doctor at rehabilitation 1 day. Follow-up with physical therapy at rehabilitation. Follow-up with speech therapy rehabilitation  If you experience worsening of your admission symptoms, develop shortness of breath, life threatening emergency, suicidal or homicidal thoughts you must seek medical attention immediately by calling 911 or calling your MD immediately  if symptoms less  severe.  You Must read complete instructions/literature along with all the possible adverse reactions/side effects for all the Medicines you take and that have been prescribed to you. Take any new Medicines after you have completely understood and accept all the possible adverse reactions/side effects.   Please note  You were cared for by a hospitalist during your hospital stay. If you have any questions about your discharge medications or the care you received while you were in the hospital after you are discharged, you can call the unit and asked to speak with the hospitalist on call if the hospitalist that took care of you is not available. Once you are discharged, your primary care physician will handle any further medical issues. Please note that NO REFILLS for any discharge medications will be authorized once you are discharged, as it is imperative that you return to your primary care physician (or establish a relationship with a primary care physician if you do not have one) for your aftercare needs so that they can reassess your need for medications and monitor your lab values.    Today   CHIEF COMPLAINT:   Chief Complaint  Patient presents with  . Aphasia  . Weakness    HISTORY OF PRESENT ILLNESS:  Cristina [name]  is a 82 y.o. female with a known history of Stroke presents with aphasia and weakness   VITAL SIGNS:  Blood pressure 122/76, pulse 85, temperature 98.2 F (36.8 C), temperature source Oral, resp. rate 18, weight 57.834 kg (127 lb 8 oz), SpO2 95 %.     PHYSICAL EXAMINATION:  GENERAL:  82 y.o.-year-old patient lying in the bed with no acute distress.  EYES: Pupils equal, round, reactive to light and accommodation. No scleral icterus. Extraocular muscles intact.  HEENT: Head atraumatic, normocephalic. Oropharynx and nasopharynx clear.  NECK:  Supple, no jugular venous distention. No thyroid enlargement, no tenderness.  LUNGS: Normal breath sounds bilaterally, no  wheezing, rales,rhonchi or crepitation. No use of accessory muscles of respiration.  CARDIOVASCULAR: S1, S2 irregularly irregular. 3 and a 6 systolic murmurs, no rubs, or gallops.  ABDOMEN: Soft, non-tender, non-distended. Bowel sounds present. No organomegaly or mass.  EXTREMITIES: 2+ pedal edema, no cyanosis, or clubbing.  NEUROLOGIC: Speech is still slowed but clearer today. Muscle strength 5/5 in all extremities. Sensation intact. Gait not checked.  PSYCHIATRIC: The patient is alert.  SKIN: No obvious rash, lesion, or ulcer.   DATA REVIEW:   CBC  Recent Labs Lab 11/05/15 0705  WBC 6.1  HGB 12.0  HCT 35.9  PLT 149*    Chemistries   Recent Labs Lab 11/03/15 1427  11/05/15 0705  NA 141  < > 140  K 4.5  < > 3.8  CL 101  < > 106  CO2 33*  < > 29  GLUCOSE 105*  < > 102*  BUN 33*  < > 30*  CREATININE 1.40*  < > 1.03*  CALCIUM 9.1  < > 8.5*  AST 45*  --   --   ALT 19  --   --   ALKPHOS  64  --   --   BILITOT 0.6  --   --   < > = values in this interval not displayed.  Cardiac Enzymes  Recent Labs Lab 11/04/15 0518  TROPONINI 0.19*     RADIOLOGY:  US Carotid Bilateral  11/04/2015  CLINICAL DATA:  Stroke EXAM: BILATERAL CAROTID DUPLEX ULTRASOUND TECHNIQUE: Pearline Cables scale imaging, color Doppler and duplex ultrasound was performed of bilateral carotid and vertebral arteries in the neck. COMPARISON:  07/24/2003 by report only REVIEW OF SYSTEMS: Quantification of carotid stenosis is based on velocity parameters that correlate the residual internal carotid diameter with NASCET-based stenosis levels, using the diameter of the distal internal carotid lumen as the denominator for stenosis measurement. The following velocity measurements were obtained: PEAK SYSTOLIC/END DIASTOLIC RIGHT ICA:                     81/16cm/sec CCA:                     [id number] SYSTOLIC ICA/CCA RATIO:  1.0 DIASTOLIC ICA/CCA RATIO: 1.1 ECA:                     98cm/sec LEFT ICA:                      90/24cm/sec CCA:                     [id number] SYSTOLIC ICA/CCA RATIO:  1.0 DIASTOLIC ICA/CCA RATIO: 1.3 ECA:                     94cm/sec FINDINGS: RIGHT CAROTID ARTERY: Mild eccentric partially calcified plaque in the bulb. No high-grade stenosis. Normal waveforms and color Doppler signal. RIGHT VERTEBRAL ARTERY:  Normal flow direction and waveform. LEFT CAROTID ARTERY: Intimal thickening in the bulb. No focal plaque accumulation or stenosis. Normal waveforms and color Doppler signal. LEFT VERTEBRAL ARTERY: Normal flow direction and waveform. IMPRESSION: 1. Mild right carotid bulb plaque resulting in less than 50% diameter stenosis. The exam does not exclude plaque ulceration or embolization. Continued surveillance recommended. Electronically Signed   By: Lucrezia Europe M.D.   On: 11/04/2015 09:31    Management plans discussed with the patient, family and they are in agreement.  CODE STATUS:     Code Status Orders        Start     Ordered   11/03/15 1848  Full code   Continuous     11/03/15 1847    Code Status History    Date Active Date Inactive Code Status Order ID Comments User Context   This patient has a current code status but no historical code status.      TOTAL TIME TAKING CARE OF THIS PATIENT: 35 minutes.    Loletha Grayer M.D on 11/06/2015 at 8:36 AM  Between 7am to 6pm - Pager - [phone]  After 6pm go to [email or web address] - password [email or web address] [facility]  Office  [phone]  CC: Primary care physician; Glendon Axe, MD

## 2015-11-06 NOTE — Progress Notes (Signed)
Report called to [place] at [place]. Room air. A fib. Takes meds crushed in apple sauce. A & O. Incontinent. Family updated of tx. IV and tele removed. Discharge instructions sent over via care manager. Pt has not reported any pain. Pt has no further concerns at this time. EMS to take to [facility].

## 2015-11-06 NOTE — Progress Notes (Signed)
Patient rested quietly tonight with no complaints of pain. Alert and able to speak small sentences, VSS, and A-fib on tele. Daughter at bedside. Nursing staff will continue to monitor. Earleen Reaper, RN

## 2015-11-06 NOTE — Progress Notes (Signed)
Clinical Social Worker was informed  that patient will be medically ready to discharge to [place]. Patient and her family are in a agreement with plan. CSW called Dorthula Matas Admissions Coordinator to confirm that patient's bed is ready. Provided patient's room number 208A and number to call for report [phone] . All discharge information [facility]ed [facility] via [place].     RN will call report and patient will discharge to [facility] via [facility].  Ernest Pine, MSW, [place] Social Work Department [phone]

## 2015-11-06 NOTE — Care Management Note (Signed)
Case Management Note  Patient Details  Name: Cristina [name] MRN: [id number] Date of Birth: Aug 26, 1923  Subjective/Objective:   Plan is discharge to [facility] today                 Action/Plan:   Expected Discharge Date:                  Expected Discharge Plan:  Skilled Nursing Facility  In-House Referral:  Clinical Social Work  Discharge planning Services  CM Consult  Post Acute Care Choice:    Choice offered to:     DME Arranged:    DME Agency:     HH Arranged:    [place] Agency:     Status of Service:  Completed, signed off  Medicare Important Message Given:  Yes Date Medicare IM Given:    Medicare IM give by:    Date Additional Medicare IM Given:    Additional Medicare Important Message give by:     If discussed at [place] of Stay Meetings, dates discussed:    Additional Comments:  Jolly Mango, RN 11/06/2015, 9:56 AM

## 2015-11-06 NOTE — Clinical Social Work Placement (Signed)
   CLINICAL SOCIAL WORK PLACEMENT  NOTE  Date:  11/06/2015  Patient Details  Name: Cristina [name] MRN: [id number] Date of Birth: [birth year]  Clinical Social Work is seeking post-discharge placement for this patient at the [place] level of care (*CSW will initial, date and re-position this form in  chart as items are completed):  Yes   Patient/family provided with [place] [facility] Work Department's list of facilities offering this level of care within the geographic area requested by the patient (or if unable, by the patient's family).  Yes   Patient/family informed of their freedom to choose among providers that offer the needed level of care, that participate in Medicare, Medicaid or managed care program needed by the patient, have an available bed and are willing to accept the patient.  Yes   Patient/family informed of [facility]'s ownership interest in [facility] and [facility], as well as of the fact that they are under no obligation to receive care at these facilities.  PASRR submitted to EDS on       PASRR number received on       Existing PASRR number confirmed on 11/06/15     FL2 transmitted to all facilities in geographic area requested by pt/family on 11/06/15     FL2 transmitted to all facilities within larger geographic area on       Patient informed that his/her managed care company has contracts with or will negotiate with certain facilities, including the following:        Yes   Patient/family informed of bed offers received.  Patient chooses bed at  [facility])     Physician recommends and patient chooses bed at      Patient to be transferred to  [facility]) on 11/06/15.  Patient to be transferred to facility by       Patient family notified on 11/06/15 of transfer.  Name of family member notified:   (Son and daughter)     PHYSICIAN       Additional Comment:     _______________________________________________ Baldemar Lenis, LCSW 11/06/2015, 10:30 AM

## 2015-11-06 NOTE — Progress Notes (Signed)
MD Earleen Newport was notified of oxygen need per EMS concern. Oxygen was 90% on RA. MD Weiting stated to send with oxygen.

## 2015-11-06 NOTE — Discharge Instructions (Signed)
Ischemic Stroke Treated Without Warfarin °An ischemic stroke (cerebrovascular accident) is the sudden death of brain tissue. It is a medical emergency. An ischemic stroke can cause permanent loss of brain function. This can cause problems with different parts of your body. °CAUSES °An ischemic stroke is caused by a decrease of oxygen supply to an area of your brain. It is usually the result of a small blood clot (embolus) or collection of cholesterol or fat (plaque) that blocks blood flow in the brain. An ischemic stroke can also be caused by blocked or damaged carotid arteries. °RISK FACTORS °· High blood pressure (hypertension). °· High cholesterol. °· Diabetes mellitus. °· Heart disease. °· The buildup of plaque in the blood vessels (peripheral artery disease or atherosclerosis). °· The buildup of plaque in the blood vessels that provide blood and oxygen to the brain (carotid artery stenosis). °· An abnormal heart rhythm (atrial fibrillation). °· Obesity. °· Smoking cigarettes. °· Taking oral contraceptives, especially in combination with using tobacco. °· Physical inactivity. °· A diet that is high in fats, salt (sodium), and calories. °· Excessive alcohol use. °· Use of illegal drugs, especially cocaine and methamphetamine. °· Being African American. °· Being over the age of 55 years. °· Family history of stroke. °· Previous history of blood clots, stroke, TIA (transient ischemic attack), or heart attack. °· Sickle cell disease. °SIGNS AND SYMPTOMS °These symptoms usually develop suddenly, or you may notice them after waking up from sleep. Symptoms may include sudden: °· Weakness or numbness in your face, arm, or leg, especially on one side of your body. °· Confusion. °· Trouble speaking (aphasia) or understanding speech. °· Trouble seeing with one or both eyes. °· Trouble walking or difficulty moving your arms or legs. °· Dizziness. °· Loss of balance or coordination. °· Severe headache with no known cause.  The headache is often described as the worst headache ever experienced. °DIAGNOSIS °Your health care provider can often determine the presence or absence of an ischemic stroke based on your symptoms, history, and physical exam. CT (computed tomography) of the brain is usually performed to confirm the stroke, determine causes, and determine stroke severity. Other tests may be done to find the cause of the stroke. These tests may include: °· ECG (electrocardiogram). °· Continuous heart monitoring. °· Echocardiogram. °· Carotid ultrasound. °· MRI. °· A scan of the brain circulation. °· Blood tests. °TREATMENT °It is very important to seek treatment at the first sign of stroke symptoms. Your health care provider may perform the following treatments within 6 hours of the onset of stroke symptoms: °· Medicine to dissolve the blood clot (thrombolytic). °· Inserting a device into the affected artery to remove the blood clot. °These treatments may not be effective if too much time has passed since your stroke symptoms began. Even if you do not know when your symptoms began, get treatment as soon as possible. There are other treatment options that may be given, such as: °· Oxygen. °· IV fluids. °· Medicines to thin the blood (anticoagulants). °· A procedure to widen blocked arteries. °Your treatment will depend on how long you have had your symptoms, the severity of your symptoms, and the cause of your symptoms. °Your health care provider will take measures to prevent short-term and long-term complications of stroke, such as: °· Breathing foreign material into the lungs (aspiration pneumonia). °· Blood clots in the legs. °· Bedsores. °· Falls. °Medicines and dietary changes may be used to help treat and manage risk factors for   stroke, such as diabetes and high blood pressure. °If any of your body's functions were impaired by stroke, you may work with physical, speech, or occupational therapists to help you recover. °HOME CARE  INSTRUCTIONS °· Take medicines only as directed by your health care provider. Follow the directions carefully. Medicines may be used to control risk factors for a stroke. Be sure that you understand all your medicine instructions. °· If swallow studies have determined that your swallowing reflex is present, you should eat healthy foods. Foods may need to be a soft or pureed consistency, or you may need to take small bites in order to avoid aspirating or choking. °· Follow physical activity guidelines as directed by your health care team. °· Do not use any tobacco products, including cigarettes, chewing tobacco, or electronic cigarettes. If you smoke, quit. If you need help quitting, ask your health care provider. °· Limit or stop alcohol use. °· A safe home environment is important to reduce the risk of falls. Your health care provider may arrange for specialists to evaluate your home. Having grab bars in the bedroom and bathroom is often important. Your health care provider may arrange for equipment to be used at home, such as raised toilets and a seat for the shower. °· Ongoing physical, occupational, and speech therapy may be needed to maximize your recovery after a stroke. If you have been advised to use a walker or a cane, use it at all times. Be sure to keep your therapy appointments. °· Keep all follow-up visits with your health care provider. This is very important. This includes any referrals, therapy, rehabilitation, and lab tests. Proper follow-up can prevent another stroke from occurring. °PREVENTION °The risk of a stroke can be decreased by appropriately treating high blood pressure, high cholesterol, diabetes, heart disease, and obesity. It can also be decreased by quitting smoking, limiting alcohol, and staying physically active. °SEEK IMMEDIATE MEDICAL CARE IF: °· You have sudden weakness or numbness in your face, arm, or leg, especially on one side of your body. °· You have sudden confusion. °· You  have sudden trouble speaking (aphasia) or understanding. °· You have sudden trouble seeing with one or both eyes. °· You have sudden trouble walking or difficulty moving your arms or legs. °· You have sudden dizziness. °· You have a sudden loss of balance or coordination. °· You have a sudden, severe headache with no known cause. °· You have a partial or total loss of consciousness. °Any of these symptoms may represent a serious problem that is an emergency. Do not wait to see if the symptoms will go away. Get medical help right away. Call your local emergency services (911 in [place] Do not drive yourself to the hospital. °  °This information is not intended to replace advice given to you by your health care provider. Make sure you discuss any questions you have with your health care provider. °  °Document Released: 05/16/2014 Document Reviewed: 05/16/2014 °Elsevier Interactive Patient Education ©2016 [employer] ° °

## 2015-11-14 ENCOUNTER — Encounter
Admission: RE | Admit: 2015-11-14 | Discharge: 2015-11-14 | Disposition: A | Discharge status: Home / Self Care | Payer: Medicare Other | Source: Ambulatory Visit | Attending: Internal Medicine | Admitting: Internal Medicine

## 2015-11-14 DIAGNOSIS — I5032 Chronic diastolic (congestive) heart failure: Secondary | ICD-10-CM | POA: Insufficient documentation

## 2015-11-17 DIAGNOSIS — I5032 Chronic diastolic (congestive) heart failure: Secondary | ICD-10-CM | POA: Diagnosis present

## 2015-11-17 LAB — COMPREHENSIVE METABOLIC PANEL
ALK PHOS: 54 U/L (ref 38–126)
ALT: 14 U/L (ref 14–54)
AST: 29 U/L (ref 15–41)
Albumin: 3.2 g/dL — ABNORMAL LOW (ref 3.5–5.0)
Anion gap: 5 (ref 5–15)
BUN: 26 mg/dL — AB (ref 6–20)
CO2: 34 mmol/L — ABNORMAL HIGH (ref 22–32)
CREATININE: 1.2 mg/dL — AB (ref 0.44–1.00)
Calcium: 8.7 mg/dL — ABNORMAL LOW (ref 8.9–10.3)
Chloride: 100 mmol/L — ABNORMAL LOW (ref 101–111)
GFR, EST AFRICAN AMERICAN: 45 mL/min — AB (ref >60)
GFR, EST NON AFRICAN AMERICAN: 39 mL/min — AB (ref >60)
Glucose, Bld: 104 mg/dL — ABNORMAL HIGH (ref 65–99)
Potassium: 3.6 mmol/L (ref 3.5–5.1)
Sodium: 139 mmol/L (ref 135–145)
TOTAL PROTEIN: 6 g/dL — AB (ref 6.5–8.1)
Total Bilirubin: 0.7 mg/dL (ref 0.3–1.2)

## 2015-11-24 DIAGNOSIS — I5032 Chronic diastolic (congestive) heart failure: Secondary | ICD-10-CM | POA: Diagnosis not present

## 2015-11-24 LAB — COMPREHENSIVE METABOLIC PANEL
ALBUMIN: 3.2 g/dL — AB (ref 3.5–5.0)
ALK PHOS: 63 U/L (ref 38–126)
ALT: 20 U/L (ref 14–54)
AST: 31 U/L (ref 15–41)
Anion gap: 6 (ref 5–15)
BILIRUBIN TOTAL: 0.9 mg/dL (ref 0.3–1.2)
BUN: 26 mg/dL — AB (ref 6–20)
CALCIUM: 8.6 mg/dL — AB (ref 8.9–10.3)
CO2: 30 mmol/L (ref 22–32)
CREATININE: 1.12 mg/dL — AB (ref 0.44–1.00)
Chloride: 101 mmol/L (ref 101–111)
GFR calc Af Amer: 49 mL/min — ABNORMAL LOW (ref >60)
GFR calc non Af Amer: 42 mL/min — ABNORMAL LOW (ref >60)
GLUCOSE: 95 mg/dL (ref 65–99)
POTASSIUM: 3.8 mmol/L (ref 3.5–5.1)
Sodium: 137 mmol/L (ref 135–145)
TOTAL PROTEIN: 5.9 g/dL — AB (ref 6.5–8.1)

## 2016-06-02 ENCOUNTER — Ambulatory Visit
Admission: RE | Admit: 2016-06-02 | Discharge: 2016-06-02 | Disposition: A | Discharge status: Home / Self Care | Payer: Medicare Other | Source: Ambulatory Visit | Attending: Physician Assistant | Admitting: Physician Assistant

## 2016-06-02 ENCOUNTER — Other Ambulatory Visit: Payer: Self-pay | Admitting: Physician Assistant

## 2016-06-02 DIAGNOSIS — R1012 Left upper quadrant pain: Secondary | ICD-10-CM

## 2016-06-02 DIAGNOSIS — I313 Pericardial effusion (noninflammatory): Secondary | ICD-10-CM | POA: Insufficient documentation

## 2016-06-02 DIAGNOSIS — R634 Abnormal weight loss: Secondary | ICD-10-CM

## 2016-06-02 DIAGNOSIS — K5730 Diverticulosis of large intestine without perforation or abscess without bleeding: Secondary | ICD-10-CM | POA: Diagnosis not present

## 2016-06-02 DIAGNOSIS — J9811 Atelectasis: Secondary | ICD-10-CM | POA: Diagnosis not present

## 2016-06-02 DIAGNOSIS — J90 Pleural effusion, not elsewhere classified: Secondary | ICD-10-CM | POA: Insufficient documentation

## 2016-06-02 DIAGNOSIS — K8020 Calculus of gallbladder without cholecystitis without obstruction: Secondary | ICD-10-CM | POA: Insufficient documentation

## 2016-06-02 DIAGNOSIS — I517 Cardiomegaly: Secondary | ICD-10-CM | POA: Diagnosis not present

## 2016-06-02 DIAGNOSIS — K5900 Constipation, unspecified: Secondary | ICD-10-CM | POA: Diagnosis not present

## 2016-06-02 DIAGNOSIS — R0781 Pleurodynia: Secondary | ICD-10-CM

## 2016-09-21 ENCOUNTER — Ambulatory Visit
Admission: RE | Admit: 2016-09-21 | Discharge: 2016-09-21 | Disposition: A | Discharge status: Home / Self Care | Payer: Medicare Other | Source: Ambulatory Visit | Attending: Internal Medicine | Admitting: Internal Medicine

## 2016-09-21 ENCOUNTER — Other Ambulatory Visit: Payer: Self-pay | Admitting: Internal Medicine

## 2016-09-21 DIAGNOSIS — G319 Degenerative disease of nervous system, unspecified: Secondary | ICD-10-CM | POA: Insufficient documentation

## 2016-09-21 DIAGNOSIS — I6782 Cerebral ischemia: Secondary | ICD-10-CM | POA: Diagnosis not present

## 2016-09-21 DIAGNOSIS — R5383 Other fatigue: Secondary | ICD-10-CM

## 2016-09-30 ENCOUNTER — Ambulatory Visit
Admission: RE | Admit: 2016-09-30 | Discharge: 2016-09-30 | Disposition: A | Discharge status: Home / Self Care | Payer: Medicare Other | Source: Ambulatory Visit | Attending: Internal Medicine | Admitting: Internal Medicine

## 2016-09-30 DIAGNOSIS — G9389 Other specified disorders of brain: Secondary | ICD-10-CM | POA: Diagnosis not present

## 2016-09-30 DIAGNOSIS — I6782 Cerebral ischemia: Secondary | ICD-10-CM | POA: Diagnosis not present

## 2016-09-30 DIAGNOSIS — R5383 Other fatigue: Secondary | ICD-10-CM | POA: Diagnosis present

## 2016-10-02 ENCOUNTER — Emergency Department
Admission: EM | Admit: 2016-10-02 | Discharge: 2016-10-03 | Disposition: A | Discharge status: Home / Self Care | Payer: Medicare Other | Attending: Emergency Medicine | Admitting: Emergency Medicine

## 2016-10-02 ENCOUNTER — Encounter: Payer: Self-pay | Admitting: Emergency Medicine

## 2016-10-02 ENCOUNTER — Emergency Department: Payer: Medicare Other

## 2016-10-02 DIAGNOSIS — Z7982 Long term (current) use of aspirin: Secondary | ICD-10-CM | POA: Diagnosis not present

## 2016-10-02 DIAGNOSIS — I509 Heart failure, unspecified: Secondary | ICD-10-CM | POA: Diagnosis not present

## 2016-10-02 DIAGNOSIS — Z79899 Other long term (current) drug therapy: Secondary | ICD-10-CM | POA: Insufficient documentation

## 2016-10-02 DIAGNOSIS — R05 Cough: Secondary | ICD-10-CM | POA: Diagnosis not present

## 2016-10-02 DIAGNOSIS — E860 Dehydration: Secondary | ICD-10-CM | POA: Insufficient documentation

## 2016-10-02 DIAGNOSIS — R4182 Altered mental status, unspecified: Secondary | ICD-10-CM | POA: Diagnosis present

## 2016-10-02 LAB — CBC WITH DIFFERENTIAL/PLATELET
Basophils Absolute: 0 10*3/uL (ref 0–0.1)
Basophils Relative: 0 %
Eosinophils Absolute: 0 10*3/uL (ref 0–0.7)
Eosinophils Relative: 0 %
HEMATOCRIT: 33.8 % — AB (ref 35.0–47.0)
HEMOGLOBIN: 11.3 g/dL — AB (ref 12.0–16.0)
LYMPHS PCT: 2 %
Lymphs Abs: 0.3 10*3/uL — ABNORMAL LOW (ref 1.0–3.6)
MCH: 30.5 pg (ref 26.0–34.0)
MCHC: 33.5 g/dL (ref 32.0–36.0)
MCV: 91.2 fL (ref 80.0–100.0)
MONOS PCT: 6 %
Monocytes Absolute: 0.9 10*3/uL (ref 0.2–0.9)
NEUTROS ABS: 14.7 10*3/uL — AB (ref 1.4–6.5)
NEUTROS PCT: 92 %
Platelets: 318 10*3/uL (ref 150–440)
RBC: 3.7 MIL/uL — AB (ref 3.80–5.20)
RDW: 15 % — ABNORMAL HIGH (ref 11.5–14.5)
WBC: 16.1 10*3/uL — AB (ref 3.6–11.0)

## 2016-10-02 LAB — URINALYSIS, COMPLETE (UACMP) WITH MICROSCOPIC
BACTERIA UA: NONE SEEN
Bilirubin Urine: NEGATIVE
Glucose, UA: NEGATIVE mg/dL
HGB URINE DIPSTICK: NEGATIVE
Ketones, ur: NEGATIVE mg/dL
Leukocytes, UA: NEGATIVE
Nitrite: NEGATIVE
PROTEIN: NEGATIVE mg/dL
SPECIFIC GRAVITY, URINE: 1.013 (ref 1.005–1.030)
pH: 6 (ref 5.0–8.0)

## 2016-10-02 LAB — COMPREHENSIVE METABOLIC PANEL
ALT: 40 U/L (ref 14–54)
ANION GAP: 10 (ref 5–15)
AST: 54 U/L — ABNORMAL HIGH (ref 15–41)
Albumin: 2.8 g/dL — ABNORMAL LOW (ref 3.5–5.0)
Alkaline Phosphatase: 128 U/L — ABNORMAL HIGH (ref 38–126)
BUN: 26 mg/dL — ABNORMAL HIGH (ref 6–20)
CO2: 33 mmol/L — AB (ref 22–32)
CREATININE: 1.51 mg/dL — AB (ref 0.44–1.00)
Calcium: 8.4 mg/dL — ABNORMAL LOW (ref 8.9–10.3)
Chloride: 93 mmol/L — ABNORMAL LOW (ref 101–111)
GFR calc non Af Amer: 29 mL/min — ABNORMAL LOW (ref >60)
GFR, EST AFRICAN AMERICAN: 34 mL/min — AB (ref >60)
Glucose, Bld: 158 mg/dL — ABNORMAL HIGH (ref 65–99)
Potassium: 4.1 mmol/L (ref 3.5–5.1)
SODIUM: 136 mmol/L (ref 135–145)
Total Bilirubin: 1.1 mg/dL (ref 0.3–1.2)
Total Protein: 6.8 g/dL (ref 6.5–8.1)

## 2016-10-02 LAB — INFLUENZA PANEL BY PCR (TYPE A & B)
Influenza A By PCR: NEGATIVE
Influenza B By PCR: NEGATIVE

## 2016-10-02 MED ORDER — SODIUM CHLORIDE 0.9 % IV BOLUS (SEPSIS)
500.0000 mL | Freq: Once | INTRAVENOUS | Status: AC
  Administered 2016-10-02: 500 mL via INTRAVENOUS

## 2016-10-02 NOTE — ED Provider Notes (Signed)
-----------------------------------------   11:57 PM on 10/02/2016 -----------------------------------------   Blood pressure (!) 109/56, pulse 100, temperature 99.2 F (37.3 C), temperature source Oral, resp. rate (!) 24, height 5\' 1"  (1.549 m), weight 127 lb (57.6 kg), SpO2 96 %.  Assuming care from Dr. Archie Balboa.  In short, Cristina [name] is a 83 y.o. female with a chief complaint of Altered Mental Status .  Refer to the original H&P for additional details.  The current plan of care is to follow-up the results of the flu swab.     The patient's flu test is negative. She'll be discharged to home.   Loney Hering, MD 10/02/16 [phone]

## 2016-10-02 NOTE — ED Notes (Signed)
Family at bedside. 

## 2016-10-02 NOTE — Discharge Instructions (Signed)
Please seek medical attention for any high fevers, chest pain, shortness of breath, change in behavior, persistent vomiting, bloody stool or any other new or concerning symptoms.  

## 2016-10-02 NOTE — ED Provider Notes (Signed)
[facility] Emergency Department Provider Note   ____________________________________________   I have reviewed the triage vital signs and the nursing notes.   HISTORY  Chief Complaint Altered Mental Status   History limited by: Altered Mental Status   HPI Cristina [name] is a 83 y.o. female who presents to the emergency department today EMS because of family's concerns for altered mental status. The patient is unable to give any significant history of family is not yet present. Patient's only main complaint is for some cough and leg pain which she says is neuropathy. Sounds like she has had a cough for couple of days. Patient denies having any chest pain, denies any abdominal pain nausea or vomiting. No fevers.   Past Medical History:  Diagnosis Date  . Atrial fibrillation ([place])   . Atrial septal defect   . Congestive heart failure (H[facility])    Diastolic dysfunction  . Diverticulosis   . DJD (degenerative joint disease)   . Neuropathy ([place])   . Osteoporosis   . Punctate hemorrhage of left frontal lobe [facility])    after head trauma in Nov 2016  . Severe mitral regurgitation     Patient Active Problem List   Diagnosis Date Noted  . CVA (cerebral infarction) 11/03/2015    Past Surgical History:  Procedure Laterality Date  . BREAST CYST EXCISION    . cataracts     Bilateral  . HIP FRACTURE SURGERY Left 2013  . SHOULDER SURGERY      Prior to Admission medications   Medication Sig Start Date End Date Taking? Authorizing Provider  aspirin EC 325 MG tablet Take 325 mg by mouth daily.    Historical Provider, MD  atorvastatin (LIPITOR) 40 MG tablet Take 1 tablet (40 mg total) by mouth daily at 6 PM. 11/06/15   Loletha Grayer, MD  clopidogrel (PLAVIX) 75 MG tablet Take 1 tablet (75 mg total) by mouth daily. 11/06/15   Loletha Grayer, MD  furosemide (LASIX) 20 MG tablet Take 1 tablet (20 mg total) by mouth daily. 11/06/15   Loletha Grayer, MD  metoprolol  (LOPRESSOR) 50 MG tablet Take 1 tablet (50 mg total) by mouth 2 (two) times daily. 11/06/15   Loletha Grayer, MD  Multiple Vitamin (MULTIVITAMIN WITH MINERALS) TABS tablet Take 1 tablet by mouth daily.    Historical Provider, MD  vitamin B-12 (CYANOCOBALAMIN) 1000 MCG tablet Take 1,000 mcg by mouth daily.    Historical Provider, MD    Allergies Patient has no known allergies.  Family History  Problem Relation Age of Onset  . Hypertension Father   . Leukemia Father   . Thyroid cancer Mother     Social History Social History  Substance Use Topics  . Smoking status: Never Smoker  . Smokeless tobacco: Never Used  . Alcohol use No    Review of Systems  Constitutional: Negative for fever. Cardiovascular: Negative for chest pain. Respiratory: Positive for cough. Gastrointestinal: Negative for abdominal pain, vomiting and diarrhea. Genitourinary: Negative for dysuria. Neurological: Negative for headaches, focal weakness or numbness.  10-point ROS otherwise negative.  ____________________________________________   PHYSICAL EXAM:  VITAL SIGNS: ED Triage Vitals  Enc Vitals Group     BP 10/02/16 1959 109/60     Pulse Rate 10/02/16 1959 84     Resp 10/02/16 1959 16     Temp 10/02/16 1959 99.2 F (37.3 C)     Temp Source 10/02/16 1959 Oral     SpO2 10/02/16 1959 92 %  Weight 10/02/16 2000 127 lb (57.6 kg)     Height 10/02/16 2000 5\' 1"  (1.549 m)   Constitutional: Alert and oriented. Well appearing and in no distress. Eyes: Conjunctivae are normal. Normal extraocular movements. ENT   Head: Normocephalic and atraumatic.   Nose: No congestion/rhinnorhea.   Mouth/Throat: Mucous membranes are moist.   Neck: No stridor. Hematological/Lymphatic/Immunilogical: No cervical lymphadenopathy. Cardiovascular: Normal rate, regular rhythm.  No murmurs, rubs, or gallops. Respiratory: Normal respiratory effort without tachypnea nor retractions. Breath sounds are clear and  equal bilaterally. No wheezes/rales/rhonchi. Gastrointestinal: Soft and non tender. No rebound. No guarding.  Genitourinary: Deferred Musculoskeletal: Normal range of motion in all extremities. No lower extremity edema. Neurologic:  Normal speech and language. No gross focal neurologic deficits are appreciated.  Skin:  Skin is warm, dry and intact. No rash noted. Psychiatric: Mood and affect are normal. Speech and behavior are normal. Patient exhibits appropriate insight and judgment.  ____________________________________________    LABS (pertinent positives/negatives)  Labs Reviewed  COMPREHENSIVE METABOLIC PANEL - Abnormal; Notable for the following:       Result Value   Chloride 93 (*)    CO2 33 (*)    Glucose, Bld 158 (*)    BUN 26 (*)    Creatinine, Ser 1.51 (*)    Calcium 8.4 (*)    Albumin 2.8 (*)    AST 54 (*)    Alkaline Phosphatase 128 (*)    GFR calc non Af Amer 29 (*)    GFR calc Af Amer 34 (*)    All other components within normal limits  CBC WITH DIFFERENTIAL/PLATELET - Abnormal; Notable for the following:    WBC 16.1 (*)    RBC 3.70 (*)    Hemoglobin 11.3 (*)    HCT 33.8 (*)    RDW 15.0 (*)    Neutro Abs 14.7 (*)    Lymphs Abs 0.3 (*)    All other components within normal limits  URINALYSIS, COMPLETE (UACMP) WITH MICROSCOPIC - Abnormal; Notable for the following:    Color, Urine YELLOW (*)    APPearance CLEAR (*)    Squamous Epithelial / LPF 0-5 (*)    All other components within normal limits  INFLUENZA PANEL BY PCR (TYPE A & B)     ____________________________________________   EKG  None  ____________________________________________    RADIOLOGY  CXR IMPRESSION:  1. Chronic cardiomegaly and Calcified aortic atherosclerosis.  2. No pulmonary edema or acute pulmonary finding. Small pleural  effusions are regressed compared 2017.      ___________________________________________   PROCEDURES  Procedures  ____________________________________________   INITIAL IMPRESSION / ASSESSMENT AND PLAN / ED COURSE  Pertinent labs & imaging results that were available during my care of the patient were reviewed by me and considered in my medical decision making (see chart for details).  Patient presented to the emergency department today because of concerns for some increased weakness. The son did arrive in the emergency department and I got a little more history from him. He states that normally either a caregiver or one of the children are over at the house. However today needed a caregiver or one of the children was able to get to the house. The patient lives at the house with her 48 year old husband. Said things that likely she was then not encouraged to drink as much fluid and did not eat as much as she normally would. It does appear the patient is slightly dehydrated. The patient did seem to improve  somewhat with fluids. Did however send an influenza given mild leukocytosis without any other source. This point I do feel patient would be safe for discharge home however I would want to know patient is for possible to help protect the rest of the family.  ____________________________________________   FINAL CLINICAL IMPRESSION(S) / ED DIAGNOSES  Final diagnoses:  Dehydration     Note: This dictation was prepared with Dragon dictation. Any transcriptional errors that result from this process are unintentional     Nance Pear, MD 10/02/16 2309

## 2016-10-02 NOTE — ED Triage Notes (Signed)
Pt presents to ED via [facility] from home. EMS state pt has c/o dysuria, pt states she doesn't know why she is here. Denies pain at this time but appears lethargic and needs questions repeated multiple times before she answers.   Call placed to pt's daughter Suzi Roots [phone]) who states pt's spouse called EMS d/t AMS and lethargy. Daughter also states pt's son Lekeshia Kram [phone]) is on his way but is coming from [place] and he has more information.

## 2016-10-02 NOTE — ED Notes (Signed)
EMS state pt was found to be tachycardic around 112, given 592mL NS with HR decreased to 90s. Pt best O2 sat on RA upon arrival 92%. Placed on 2L n/c with O2 97%.

## 2017-05-10 IMAGING — CR DG CHEST 1V
1 series · 1 of 1 positions shown · non-contrast
Comparison: 07/06/2015

CLINICAL DATA: Altered mental status, atrial fibrillation,
congestive heart failure

EXAM:
CHEST 1 VIEW

[dg chest 1 view]
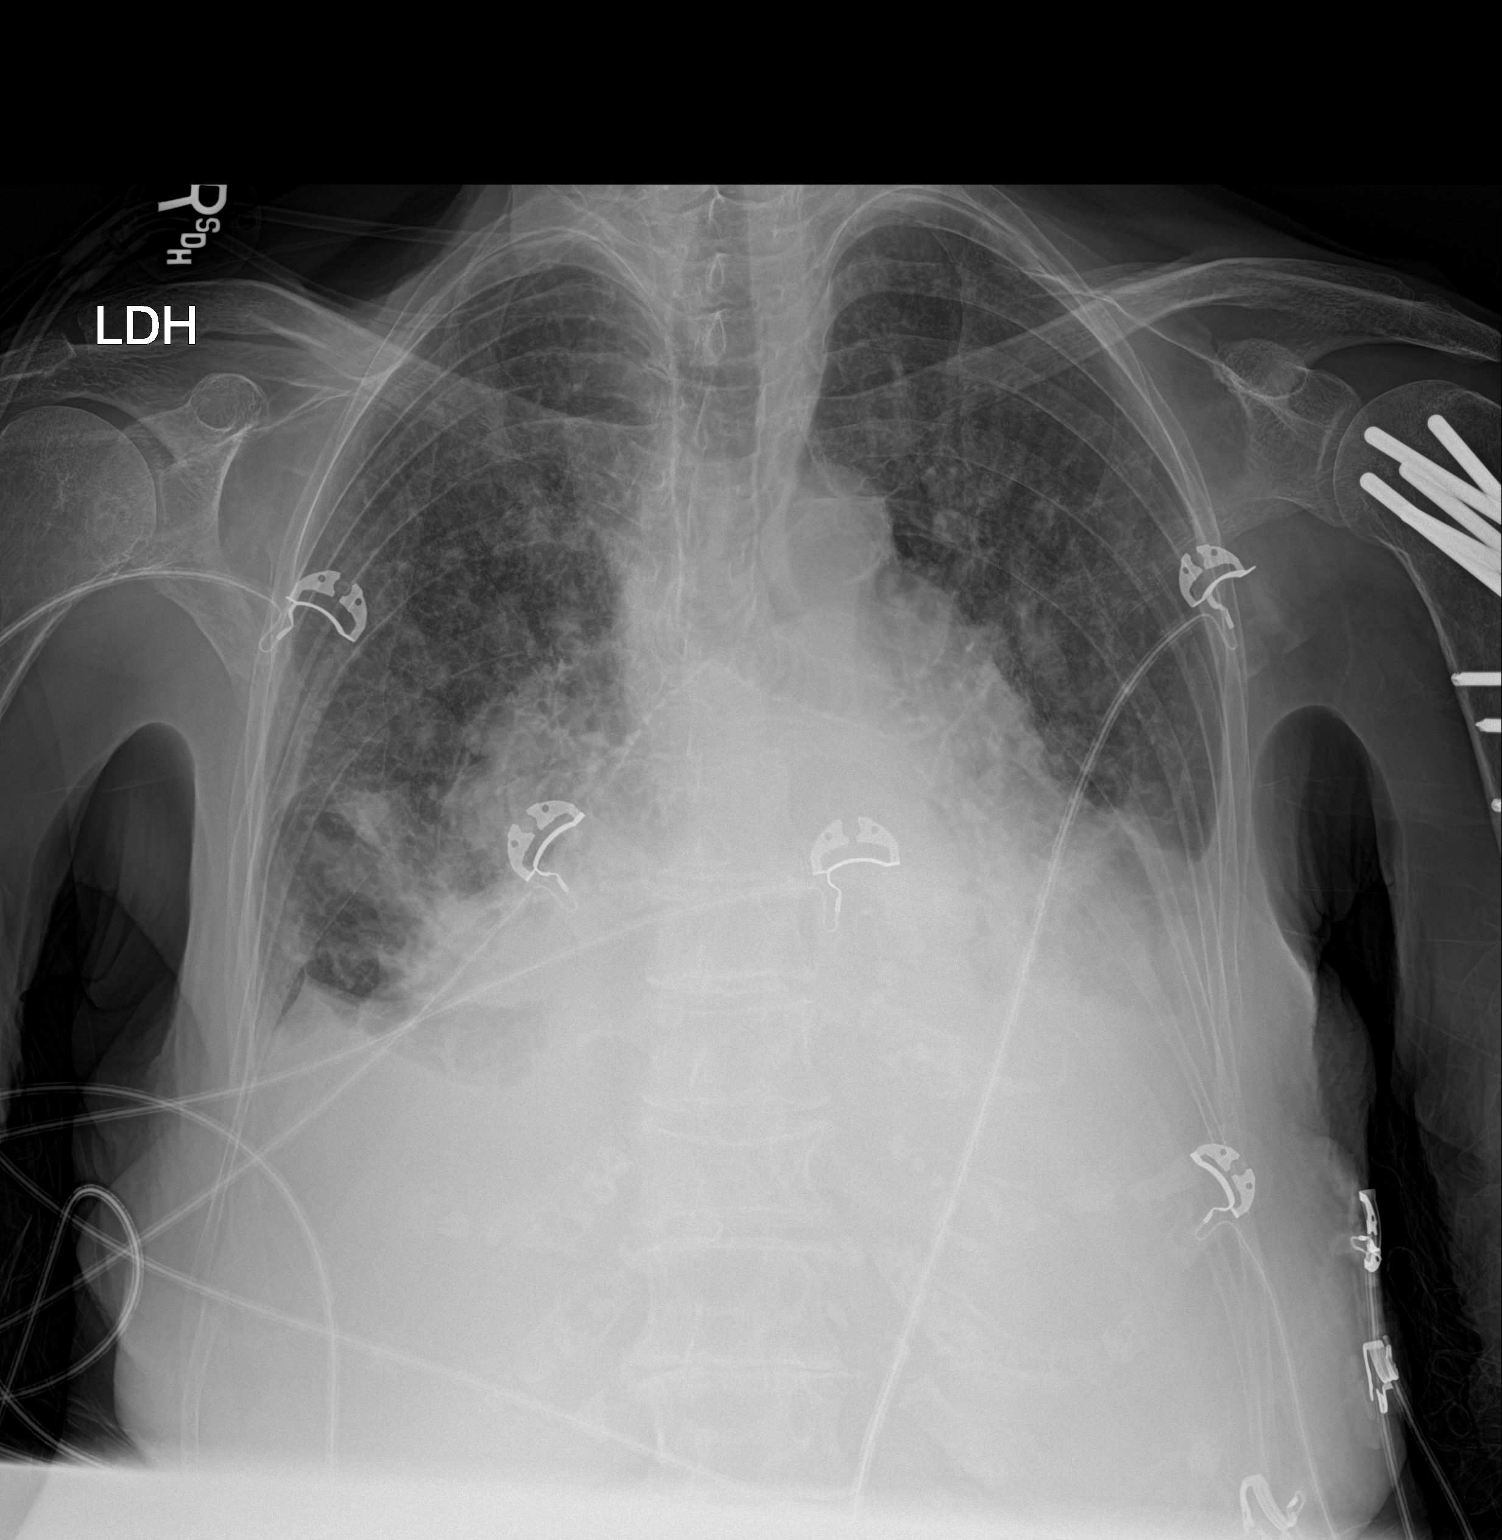

[1 of 1 positions shown; findings below may reference images not displayed]

FINDINGS: Cardiomegaly again noted. Central mild vascular congestion without
convincing pulmonary edema. There is small left pleural effusion.
Bilateral basilar atelectasis or infiltrate left greater than right.
IMPRESSION: Cardiomegaly. Central vascular congestion without convincing
pulmonary edema. Small left pleural effusion. Bilateral basilar
atelectasis or infiltrate left greater than right.

## 2017-05-10 IMAGING — CT CT HEAD W/O CM
1 of 2 series · 15 of 30 positions shown, 19 images · non-contrast
Comparison: July 06, 2015

CLINICAL DATA: Dysphasia

EXAM:
CT HEAD WITHOUT CONTRAST
TECHNIQUE: Contiguous axial images were obtained from the base of the skull
through the vertex without intravenous contrast.

[Series 2: soft tissue · axial · 0.42mm/px · z∈[+320,+440]mm · 15 of 28 slices shown, 19 images]
[im 2/28  brain]
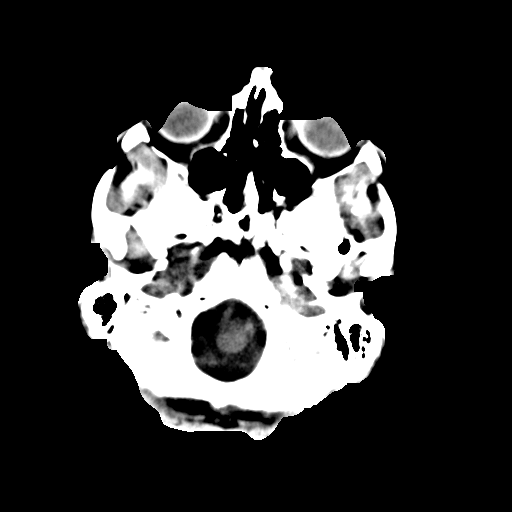
[im 2/28  bone]
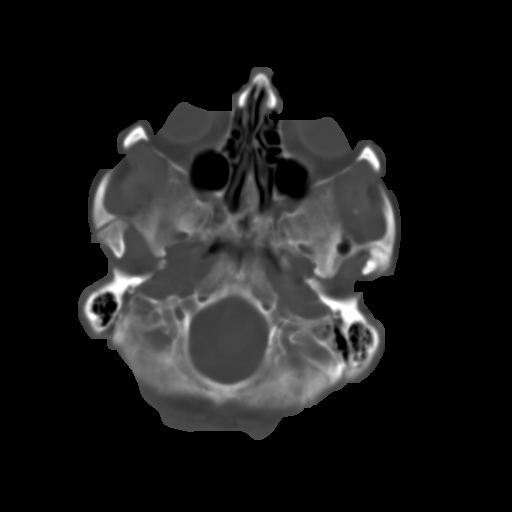
[im 4/28  brain]
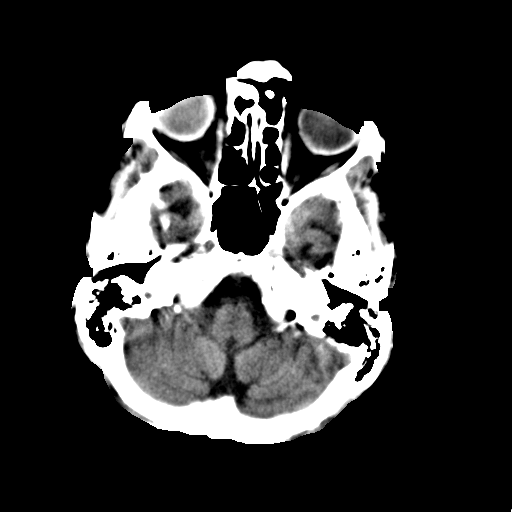
[im 5/28  brain]
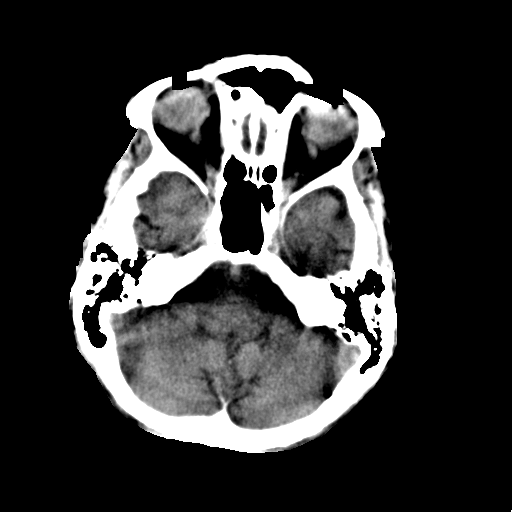
[im 7/28  brain]
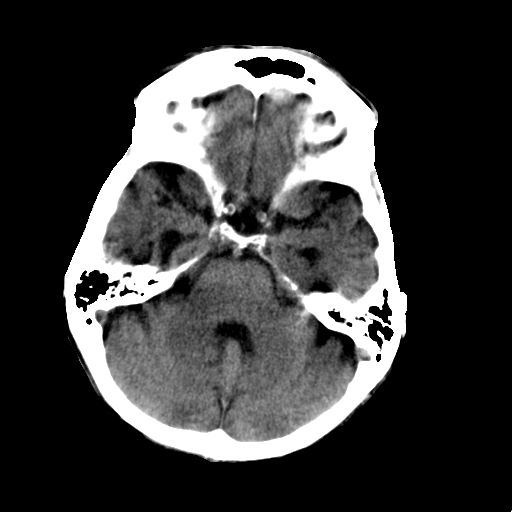
[im 8/28  brain]
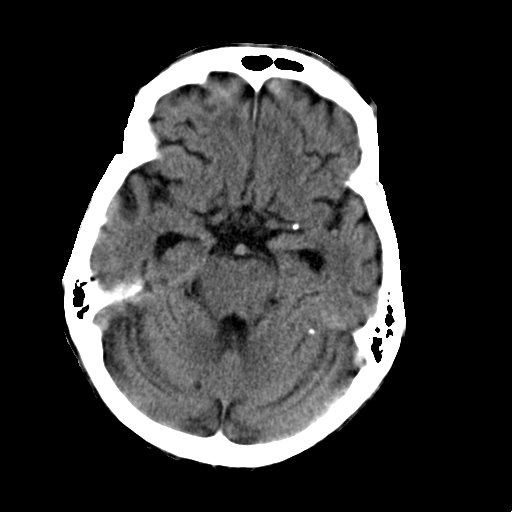
[im 8/28  bone]
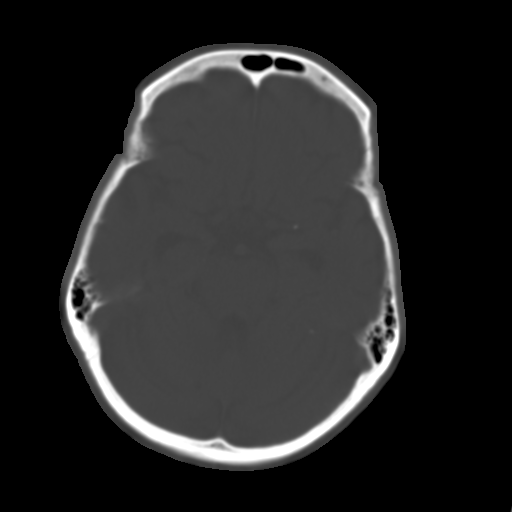
[im 11/28  brain]
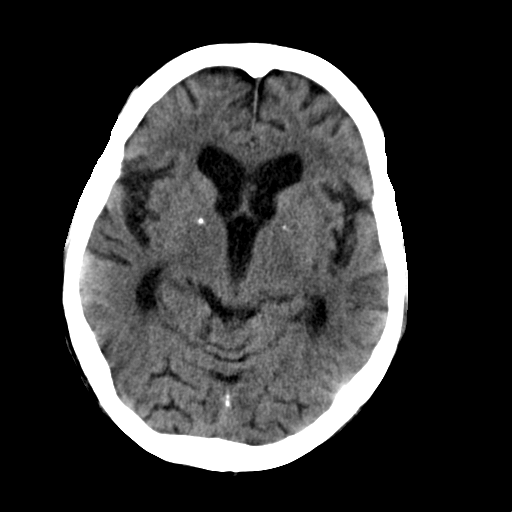
[im 12/28  brain]
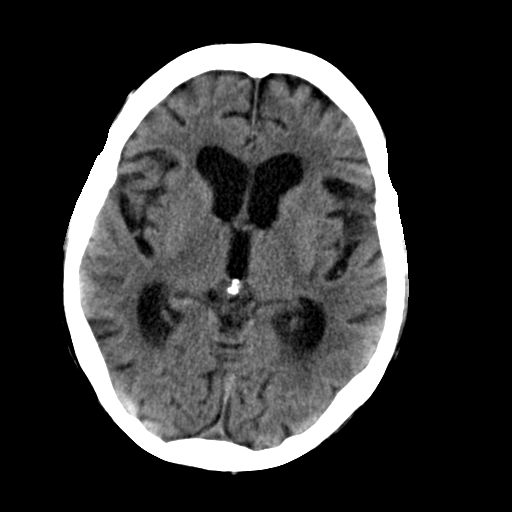
[im 14/28  brain]
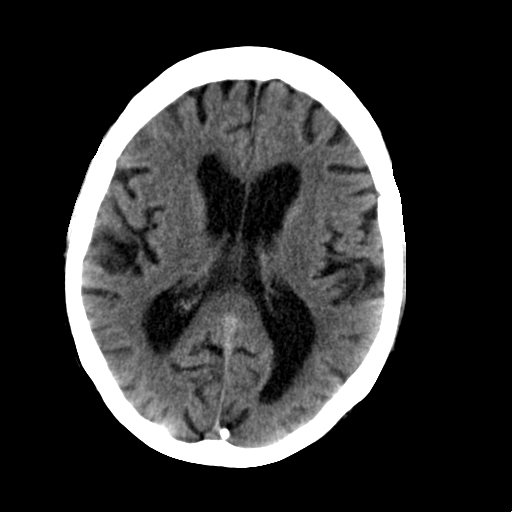
[im 16/28  brain]
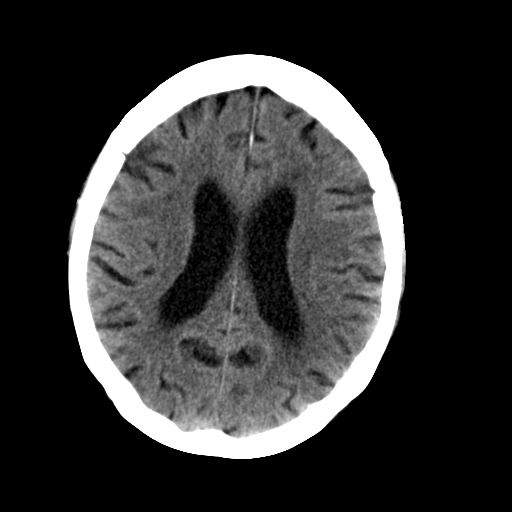
[im 16/28  bone]
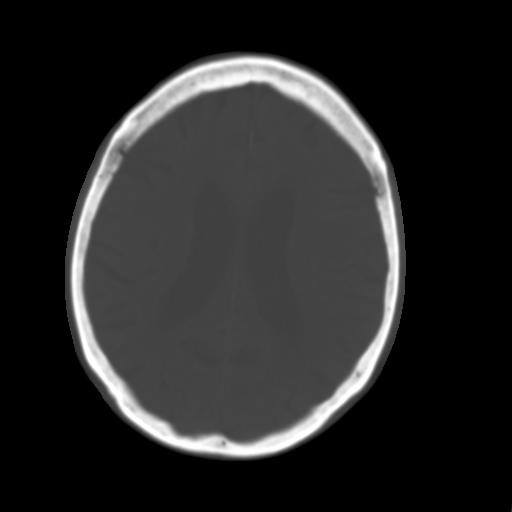
[im 17/28  brain]
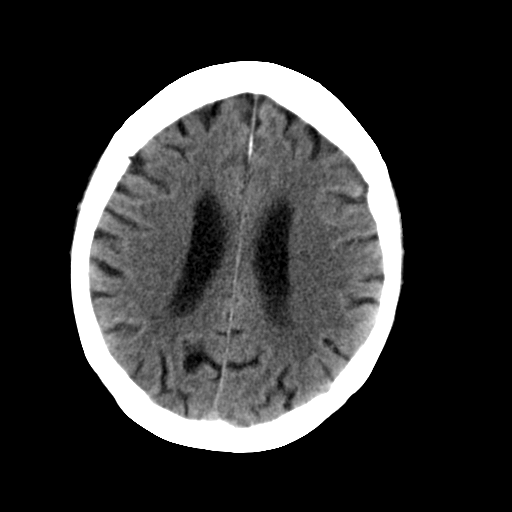
[im 20/28  brain]
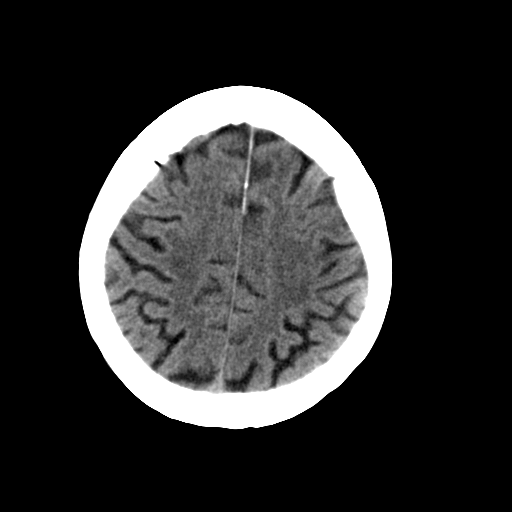
[im 21/28  brain]
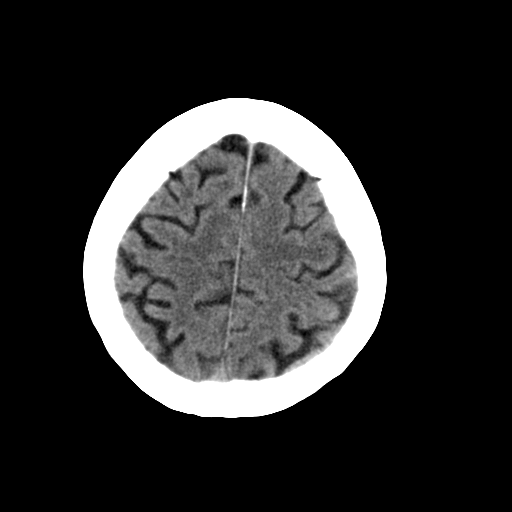
[im 23/28  brain]
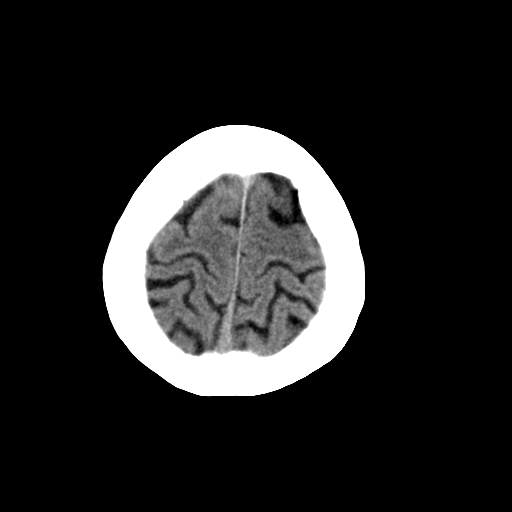
[im 23/28  bone]
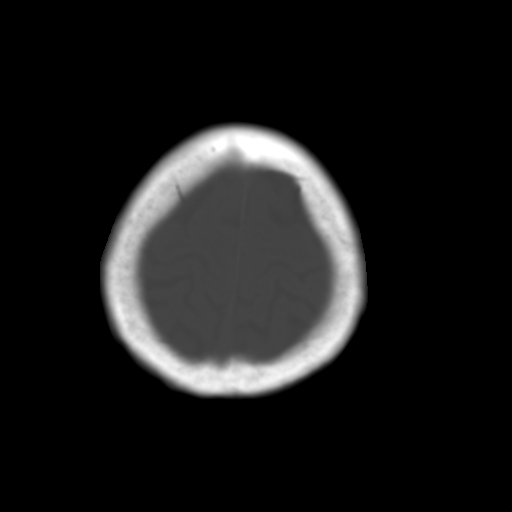
[im 24/28  brain]
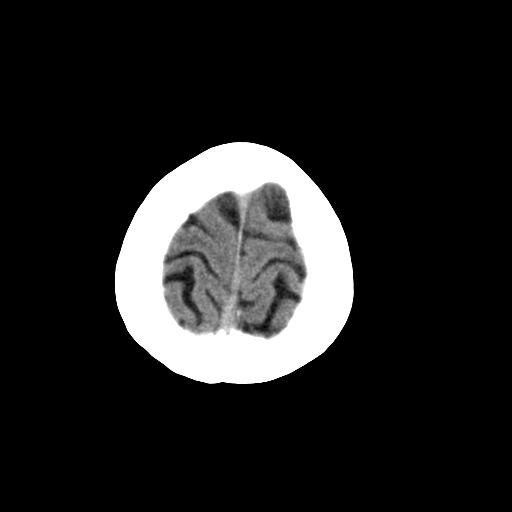
[im 26/28  brain]
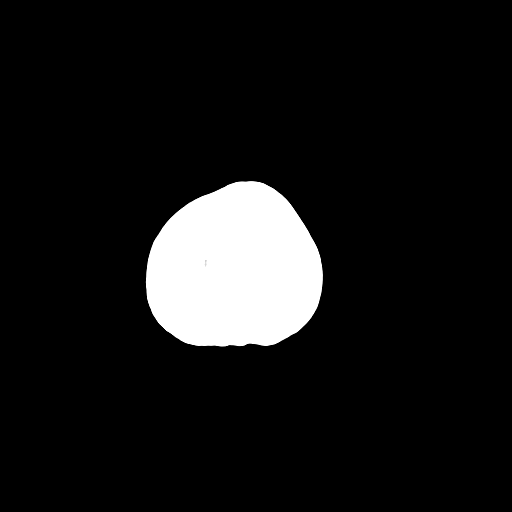

[15 of 30 positions shown; findings below may reference images not displayed]

FINDINGS: Mild diffuse atrophy is stable. There has been interval resolution
of hemorrhage from the left frontal lobe. Currently, no hemorrhage
is evident on this study. There is no apparent mass, midline shift,
or extra-axial fluid collection. There is mild patchy small vessel
disease in the centra semiovale bilaterally. No acute infarct
evident. The bony calvarium appears intact. Visualized mastoid air
cells are clear. No intraorbital lesions are evident. Small foci of
basal ganglia calcification remain stable and are felt to be
physiologic in this age group.
IMPRESSION: Mild atrophy with mild patchy periventricular small vessel disease.
Prior left frontal lobe hemorrhage has resolved. Currently there is
no hemorrhage. There is no demonstrable mass or extra-axial fluid
collection. No acute infarct evident.

## 2017-07-27 ENCOUNTER — Ambulatory Visit
Admission: RE | Admit: 2017-07-27 | Discharge: 2017-07-27 | Disposition: A | Discharge status: Home / Self Care | Payer: Medicare Other | Source: Ambulatory Visit | Attending: Internal Medicine | Admitting: Internal Medicine

## 2017-07-27 ENCOUNTER — Other Ambulatory Visit: Payer: Self-pay | Admitting: Internal Medicine

## 2017-07-27 DIAGNOSIS — M7989 Other specified soft tissue disorders: Secondary | ICD-10-CM

## 2017-07-28 ENCOUNTER — Ambulatory Visit: Payer: Medicare Other

## 2018-08-06 ENCOUNTER — Encounter: Payer: Medicare Other | Attending: Physician Assistant | Admitting: Physician Assistant

## 2018-08-06 DIAGNOSIS — I130 Hypertensive heart and chronic kidney disease with heart failure and stage 1 through stage 4 chronic kidney disease, or unspecified chronic kidney disease: Secondary | ICD-10-CM | POA: Insufficient documentation

## 2018-08-06 DIAGNOSIS — X58XXXA Exposure to other specified factors, initial encounter: Secondary | ICD-10-CM | POA: Diagnosis not present

## 2018-08-06 DIAGNOSIS — L97818 Non-pressure chronic ulcer of other part of right lower leg with other specified severity: Secondary | ICD-10-CM | POA: Diagnosis present

## 2018-08-06 DIAGNOSIS — Z8673 Personal history of transient ischemic attack (TIA), and cerebral infarction without residual deficits: Secondary | ICD-10-CM | POA: Diagnosis not present

## 2018-08-06 DIAGNOSIS — N184 Chronic kidney disease, stage 4 (severe): Secondary | ICD-10-CM | POA: Insufficient documentation

## 2018-08-06 DIAGNOSIS — I509 Heart failure, unspecified: Secondary | ICD-10-CM | POA: Insufficient documentation

## 2018-08-06 DIAGNOSIS — I480 Paroxysmal atrial fibrillation: Secondary | ICD-10-CM | POA: Diagnosis not present

## 2018-08-06 DIAGNOSIS — F0390 Unspecified dementia without behavioral disturbance: Secondary | ICD-10-CM | POA: Diagnosis not present

## 2018-08-06 DIAGNOSIS — Z7901 Long term (current) use of anticoagulants: Secondary | ICD-10-CM | POA: Insufficient documentation

## 2018-08-06 DIAGNOSIS — S81801A Unspecified open wound, right lower leg, initial encounter: Secondary | ICD-10-CM | POA: Insufficient documentation

## 2018-08-07 NOTE — Progress Notes (Signed)
Mcgurn, Ethelean C. ([id number]) Visit Report for 08/06/2018 Abuse/Suicide Risk Screen Details Patient Name: [name], [name] Date of Service: 08/06/2018 2:30 PM Medical Record Number: [id number] Patient Account Number: [id number] Date of Birth/Sex: Jul 08, 1924 (84 y.o. F) Treating RN: Montey Hora Primary Care Casmira Cramer: Glendon Axe Other Clinician: Referring Auriah Hollings: Grayland Ormond Treating Briggs Edelen/Extender: STONE III, HOYT Weeks in Treatment: 0 Abuse/Suicide Risk Screen Items Answer ABUSE/SUICIDE RISK SCREEN: Has anyone close to you tried to hurt or harm you recentlyo No Do you feel uncomfortable with anyone in your familyo No Has anyone forced you do things that you didnot want to doo No Do you have any thoughts of harming yourselfo No Patient displays signs or symptoms of abuse and/or neglect. No Electronic Signature(s) Signed: 08/06/2018 4:55:08 PM By: Montey Hora Entered By: Montey Hora on 08/06/2018 14:48:22 Lecrone, Janae C. ([id number]) -------------------------------------------------------------------------------- Activities of Daily Living Details Patient Name: [name], [name] Date of Service: 08/06/2018 2:30 PM Medical Record Number: [id number] Patient Account Number: [id number] Date of Birth/Sex: [birth year] (84 y.o. F) Treating RN: Montey Hora Primary Care Ulysess Witz: Glendon Axe Other Clinician: Referring Kristalynn Coddington: Grayland Ormond Treating Mykayla Brinton/Extender: STONE III, HOYT Weeks in Treatment: 0 Activities of Daily Living Items Answer Activities of Daily Living (Please select one for each item) Drive Automobile Not Able Take Medications Completely Able Use Telephone Completely Able Care for Appearance Completely Able Use Toilet Completely Able Bath / Shower Need Assistance Dress Self Completely Able Feed Self Completely Able Walk Need Assistance Get In / Out Bed Completely Able Housework Need Assistance Prepare Meals Completely  [place] for Self Need Assistance Electronic Signature(s) Signed: 08/06/2018 4:55:08 PM By: Montey Hora Entered By: Montey Hora on 08/06/2018 14:48:51 Kovar, Florean C. ([id number]) -------------------------------------------------------------------------------- Education Assessment Details Patient Name: [name], [name] Date of Service: 08/06/2018 2:30 PM Medical Record Number: [id number] Patient Account Number: [id number] Date of Birth/Sex: 1924/03/02 (84 y.o. F) Treating RN: Montey Hora Primary Care Jadarrius Maselli: Glendon Axe Other Clinician: Referring Trysta Showman: Grayland Ormond Treating Azahel Belcastro/Extender: Melburn Hake, HOYT Weeks in Treatment: 0 Primary Learner Assessed: Patient Learning Preferences/Education Level/Primary Language Learning Preference: Explanation, Demonstration Highest Education Level: College or Above Preferred Language: English Cognitive Barrier Assessment/Beliefs Language Barrier: No Translator Needed: No Memory Deficit: No Emotional Barrier: No Cultural/Religious Beliefs Affecting Medical Care: No Physical Barrier Assessment Impaired Vision: No Impaired Hearing: No Decreased Hand dexterity: No Knowledge/Comprehension Assessment Knowledge Level: Medium Comprehension Level: Medium Ability to understand written Medium instructions: Ability to understand verbal Medium instructions: Motivation Assessment Anxiety Level: Calm Cooperation: Cooperative Education Importance: Acknowledges Need Interest in Health Problems: Asks Questions Perception: Coherent Willingness to Engage in Self- Medium Management Activities: Readiness to Engage in Self- Medium Management Activities: Electronic Signature(s) Signed: 08/06/2018 4:55:08 PM By: Montey Hora Entered By: Montey Hora on 08/06/2018 14:49:14 Rathje, Saima C.  ([id number]) -------------------------------------------------------------------------------- Fall Risk Assessment Details Patient Name: [name], [name] Date of Service: 08/06/2018 2:30 PM Medical Record Number: [id number] Patient Account Number: [id number] Date of Birth/Sex: 1924/04/27 (84 y.o. F) Treating RN: Montey Hora Primary Care Daric Koren: Glendon Axe Other Clinician: Referring Consuela Widener: Grayland Ormond Treating Castin Donaghue/Extender: STONE III, HOYT Weeks in Treatment: 0 Fall Risk Assessment Items Have you had 2 or more falls in the last 12 monthso 0 No Have you had any fall that resulted in injury in the last 12 monthso 0 Yes FALL RISK ASSESSMENT: History of falling - immediate or within 3 months 25 Yes Secondary diagnosis 0 No Ambulatory aid None/bed rest/wheelchair/nurse 0 No Crutches/cane/walker 15 Yes Furniture  0 No IV Access/Saline Lock 0 No Gait/Training Normal/bed rest/immobile 0 No Weak 10 Yes Impaired 20 Yes Mental Status Oriented to own ability 0 Yes Electronic Signature(s) Signed: 08/06/2018 4:55:08 PM By: Montey Hora Entered By: Montey Hora on 08/06/2018 14:49:43 Elison, Britiny C. ([id number]) -------------------------------------------------------------------------------- Foot Assessment Details Patient Name: [name], [name] Date of Service: 08/06/2018 2:30 PM Medical Record Number: [id number] Patient Account Number: [id number] Date of Birth/Sex: 05/24/1924 (84 y.o. F) Treating RN: Montey Hora Primary Care Keeven Matty: Glendon Axe Other Clinician: Referring Khaniya Tenaglia: Grayland Ormond Treating Shenekia Riess/Extender: STONE III, HOYT Weeks in Treatment: 0 Foot Assessment Items Site Locations + = Sensation present, - = Sensation absent, C = Callus, U = Ulcer R = Redness, W = Warmth, M = Maceration, PU = Pre-ulcerative lesion F = Fissure, S = Swelling, D = Dryness Assessment Right: Left: Other Deformity: No No Prior Foot Ulcer: No  No Prior Amputation: No No Charcot Joint: No No Ambulatory Status: Ambulatory With Help Assistance Device: Cane Gait: Administrator, arts) Signed: 08/06/2018 4:55:08 PM By: Montey Hora Entered By: Montey Hora on 08/06/2018 14:50:07 Siordia, Mahdiya C. ([id number]) -------------------------------------------------------------------------------- Nutrition Risk Assessment Details Patient Name: Modesitt, Ellerie C. Date of Service: 08/06/2018 2:30 PM Medical Record Number: [id number] Patient Account Number: [id number] Date of Birth/Sex: [birth year] (84 y.o. F) Treating RN: Montey Hora Primary Care Jeannine Pennisi: Glendon Axe Other Clinician: Referring Julina Altmann: Grayland Ormond Treating Nalleli Largent/Extender: STONE III, HOYT Weeks in Treatment: 0 Height (in): 65 Weight (lbs): 115 Body Mass Index (BMI): 19.1 Nutrition Risk Assessment Items NUTRITION RISK SCREEN: I have an illness or condition that made me change the kind and/or amount of 0 No food I eat I eat fewer than two meals per day 0 No I eat few fruits and vegetables, or milk products 0 No I have three or more drinks of beer, liquor or wine almost every day 0 No I have tooth or mouth problems that make it hard for me to eat 0 No I don't always have enough money to buy the food I need 0 No I eat alone most of the time 0 No I take three or more different prescribed or over-the-counter drugs a day 1 Yes Without wanting to, I have lost or gained 10 pounds in the last six months 0 No I am not always physically able to shop, cook and/or feed myself 0 No Nutrition Protocols Good Risk Protocol 0 No interventions needed Moderate Risk Protocol Electronic Signature(s) Signed: 08/06/2018 4:55:08 PM By: Montey Hora Entered By: Montey Hora on 08/06/2018 14:49:50

## 2018-08-08 NOTE — Progress Notes (Signed)
Gatliff, Shainna C. ([id number]) Visit Report for 08/06/2018 Chief Complaint Document Details Patient Name: [name], [name] Date of Service: 08/06/2018 2:30 PM Medical Record Number: [id number] Patient Account Number: [id number] Date of Birth/Sex: 1924/03/12 (84 y.o. F) Treating RN: Harold Barban Primary Care Provider: Glendon Axe Other Clinician: Referring Provider: Grayland Ormond Treating Provider/Extender: Melburn Hake, HOYT Weeks in Treatment: 0 Information Obtained from: Patient Chief Complaint Right anterior LE Ulcer Electronic Signature(s) Signed: 08/07/2018 8:07:41 AM By: Worthy Keeler PA-C Entered By: Worthy Keeler on 08/06/2018 15:31:15 Iwan, Salvatrice C. ([id number]) -------------------------------------------------------------------------------- Debridement Details Patient Name: [name], [name] Date of Service: 08/06/2018 2:30 PM Medical Record Number: [id number] Patient Account Number: [id number] Date of Birth/Sex: 05-29-1924 (84 y.o. F) Treating RN: Harold Barban Primary Care Provider: Glendon Axe Other Clinician: Referring Provider: Grayland Ormond Treating Provider/Extender: STONE III, HOYT Weeks in Treatment: 0 Debridement Performed for Wound #1 Right,Anterior Lower Leg Assessment: Performed By: Physician STONE III, HOYT E., PA-C Debridement Type: Debridement Level of Consciousness (Pre- Awake and Alert procedure): Pre-procedure Verification/Time Yes - 15:39 Out Taken: Start Time: 15:39 Pain Control: Lidocaine Total Area Debrided (L x W): 2.1 (cm) x 1.5 (cm) = 3.15 (cm) Tissue and other material Non-Viable, Eschar, Slough, Subcutaneous, Slough debrided: Level: Skin/Subcutaneous Tissue Debridement Description: Excisional Instrument: Curette Bleeding: Minimum Hemostasis Achieved: Pressure End Time: 15:42 Procedural Pain: 2 Post Procedural Pain: 2 Response to Treatment: Procedure was tolerated well Level of Consciousness Awake  and Alert (Post-procedure): Post Debridement Measurements of Total Wound Length: (cm) 2.1 Width: (cm) 1.5 Depth: (cm) 0.2 Volume: (cm) 0.495 Character of Wound/Ulcer Post Debridement: Improved Post Procedure Diagnosis Same as Pre-procedure Electronic Signature(s) Signed: 08/07/2018 8:07:41 AM By: Worthy Keeler PA-C Signed: 08/07/2018 1:37:43 PM By: Harold Barban Entered By: Harold Barban on 08/06/2018 15:41:52 Stones, Crosby C. ([id number]) -------------------------------------------------------------------------------- HPI Details Patient Name: [name], [name] Date of Service: 08/06/2018 2:30 PM Medical Record Number: [id number] Patient Account Number: [id number] Date of Birth/Sex: November 09, 1923 (84 y.o. F) Treating RN: Harold Barban Primary Care Provider: Glendon Axe Other Clinician: Referring Provider: Grayland Ormond Treating Provider/Extender: Melburn Hake, HOYT Weeks in Treatment: 0 History of Present Illness HPI Description: 08/06/18 on evaluation today patient actually presents for initial evaluation concerning a right anterior lower extremity ulcer which has been present for 4-5 months. She is a very poor historian really does not know if she tripped or how in fact she fell and injured this area. Upon further questioning it appears she actually has dimension I will explain a scenario such as how to perform the dressing changes and then she will ask me questions such as how often it should be done when I very specifically pointed this out to her. I definitely think that there is a strong component of dementia here leading to her poor history. She does have a history of atrial fibrillation as will a stroke and is on blood thinners chronically for this. She also has stage IV chronic kidney disease. Patient's wound over the anterior lower extremity of the right leg actually show signs of being completely eschar covered upon my initial evaluation today. No fevers,  chills, nausea, or vomiting noted at this time. Electronic Signature(s) Signed: 08/07/2018 8:07:41 AM By: Worthy Keeler PA-C Entered By: Worthy Keeler on 08/07/2018 08:06:05 Cleek, Chasya C. ([id number]) -------------------------------------------------------------------------------- Physical Exam Details Patient Name: [name], [name] Date of Service: 08/06/2018 2:30 PM Medical Record Number: [id number] Patient Account Number: [id number] Date of Birth/Sex: 07-05-1924 (84 y.o. F) Treating RN: Harold Barban Primary  Care Provider: Glendon Axe Other Clinician: Referring Provider: Grayland Ormond Treating Provider/Extender: STONE III, HOYT Weeks in Treatment: 0 Constitutional sitting or standing blood pressure is within target range for patient.. pulse regular and within target range for patient.Marland Kitchen respirations regular, non-labored and within target range for patient.Marland Kitchen temperature within target range for patient.. Well- nourished and well-hydrated in no acute distress. Eyes conjunctiva clear no eyelid edema noted. pupils equal round and reactive to light and accommodation. Ears, Nose, Mouth, and Throat no gross abnormality of ear auricles or external auditory canals. normal hearing noted during conversation. mucus membranes moist. Respiratory normal breathing without difficulty. clear to auscultation bilaterally. Cardiovascular regular rate and rhythm with normal S1, S2. 2+ dorsalis pedis/posterior tibialis pulses. no clubbing, cyanosis, significant edema, <3 sec cap refill. Gastrointestinal (GI) soft, non-tender, non-distended, +BS. no ventral hernia noted. Musculoskeletal normal gait and posture. no significant deformity or arthritic changes, no loss or range of motion, no clubbing. Psychiatric Patient is not able to cooperate in decision making regarding care. Patient is oriented to person and place. pleasant and cooperative. Notes During evaluation I did explain to  the patient that I felt like she did need to have this area sharply debride away in order to clear away the necrotic material and help this area to heal more appropriately. I explained that this needs to be covered at all times less she is in the shower. Again it was at this point while discussing the dressing changes and how this will be performed but it became clear that she is not going to be able to do this on parole to court. She has significant dimension at least significant enough to not be able to follow directions as such. She went back to asking me how often we should change the dressing when that was the first thing that I had told her and she confirmed every other day at that point. Nonetheless I think that she is going to require skilled nursing services to help with the dressing changes at this point. Electronic Signature(s) Signed: 08/07/2018 8:07:41 AM By: Worthy Keeler PA-C Entered By: Worthy Keeler on 08/07/2018 08:06:48 Feil, Leni CMarland Kitchen ([id number]) -------------------------------------------------------------------------------- Physician Orders Details Patient Name: Sabine, Meghen C. Date of Service: 08/06/2018 2:30 PM Medical Record Number: [id number] Patient Account Number: [id number] Date of Birth/Sex: August 27, 1923 (84 y.o. F) Treating RN: Harold Barban Primary Care Provider: Glendon Axe Other Clinician: Referring Provider: Grayland Ormond Treating Provider/Extender: Melburn Hake, HOYT Weeks in Treatment: 0 Verbal / Phone Orders: No Diagnosis Coding ICD-10 Coding Code Description S81.801A Unspecified open wound, right lower leg, initial encounter L97.818 Non-pressure chronic ulcer of other part of right lower leg with other specified severity N18.4 Chronic kidney disease, stage 4 (severe) I48.0 Paroxysmal atrial fibrillation Z79.01 Long term (current) use of anticoagulants Wound Cleansing o May Shower, gently pat wound dry prior to applying new dressing.  - Clean wound with Dial Antibacterial soap ( gold soap ) pump bottle Primary Wound Dressing Wound #1 Right,Anterior Lower Leg o Hydrafera Blue Ready Transfer Secondary Dressing Wound #1 Right,Anterior Lower Leg o [place] Dressing Change Frequency Wound #1 Right,Anterior Lower Leg o Change Dressing Monday, Wednesday, Friday Follow-up Appointments Wound #1 Right,Anterior Lower Leg o Return Appointment in 2 weeks. Home Health Wound #1 [place] for [place] Nurse may visit PRN to address patientos wound care needs. o FACE TO FACE ENCOUNTER: MEDICARE and MEDICAID PATIENTS: I certify that this patient is under my  care and that I had a face-to-face encounter that meets the physician face-to-face encounter requirements with this patient on this date. The encounter with the patient was in whole or in part for the following MEDICAL CONDITION: (primary reason for [place]) MEDICAL NECESSITY: I certify, that based on my findings, NURSING services are a medically necessary home health service. HOME BOUND STATUS: I certify that my clinical findings support that this patient is homebound (i.e., Due to illness or injury, pt requires aid of supportive devices such as crutches, cane, wheelchairs, walkers, the use of special transportation or the assistance of another person to leave their place of residence. There is a normal inability to leave the home and doing so requires considerable and taxing effort. Other absences are for medical reasons / religious services and are infrequent or of short duration when for other reasons). Melody, Kimiya C. ([id number]) o If current dressing causes regression in wound condition, may D/C ordered dressing product/s and apply Normal Saline Moist Dressing daily until next [place] / Other MD appointment. [place] of regression in wound condition at  [phone]. o Please direct any NON-WOUND related issues/requests for orders to patient's Primary Care Physician Electronic Signature(s) Signed: 08/07/2018 8:07:41 AM By: Worthy Keeler PA-C Signed: 08/07/2018 1:37:43 PM By: Harold Barban Entered By: Harold Barban on 08/06/2018 [place], Jonda C. ([id number]) -------------------------------------------------------------------------------- Problem List Details Patient Name: Wrobleski, Darcee C. Date of Service: 08/06/2018 2:30 PM Medical Record Number: [id number] Patient Account Number: [id number] Date of Birth/Sex: 1923/08/25 (84 y.o. F) Treating RN: Harold Barban Primary Care Provider: Glendon Axe Other Clinician: Referring Provider: Grayland Ormond Treating Provider/Extender: Melburn Hake, HOYT Weeks in Treatment: 0 Active Problems ICD-10 Evaluated Encounter Code Description Active Date Today Diagnosis S81.801A Unspecified open wound, right lower leg, initial encounter 08/06/2018 No Yes L97.818 Non-pressure chronic ulcer of other part of right lower leg 08/06/2018 No Yes with other specified severity N18.4 Chronic kidney disease, stage 4 (severe) 08/06/2018 No Yes I48.0 Paroxysmal atrial fibrillation 08/06/2018 No Yes Z79.01 Long term (current) use of anticoagulants 08/06/2018 No Yes Inactive Problems Resolved Problems Electronic Signature(s) Signed: 08/07/2018 8:07:41 AM By: Worthy Keeler PA-C Entered By: Worthy Keeler on 08/06/2018 15:30:21 Ramroop, Trini C. ([id number]) -------------------------------------------------------------------------------- Progress Note Details Patient Name: Schrag, Lorice C. Date of Service: 08/06/2018 2:30 PM Medical Record Number: [id number] Patient Account Number: [id number] Date of Birth/Sex: 1923/11/26 (84 y.o. F) Treating RN: Harold Barban Primary Care Provider: Glendon Axe Other Clinician: Referring Provider: Grayland Ormond Treating Provider/Extender:  Melburn Hake, HOYT Weeks in Treatment: 0 Subjective Chief Complaint Information obtained from Patient Right anterior LE Ulcer History of Present Illness (HPI) 08/06/18 on evaluation today patient actually presents for initial evaluation concerning a right anterior lower extremity ulcer which has been present for 4-5 months. She is a very poor historian really does not know if she tripped or how in fact she fell and injured this area. Upon further questioning it appears she actually has dimension I will explain a scenario such as how to perform the dressing changes and then she will ask me questions such as how often it should be done when I very specifically pointed this out to her. I definitely think that there is a strong component of dementia here leading to her poor history. She does have a history of atrial fibrillation as will a stroke and is on blood thinners chronically for this. She also has stage IV chronic kidney disease. Patient's wound over the anterior lower  extremity of the right leg actually show signs of being completely eschar covered upon my initial evaluation today. No fevers, chills, nausea, or vomiting noted at this time. Wound History Patient presents with 1 open wound that has been present for approximately 4 or 5 months. Patient has been treating wound in the following manner: aloe. Laboratory tests have not been performed in the last month. Patient reportedly has not tested positive for an antibiotic resistant organism. Patient reportedly has not tested positive for osteomyelitis. Patient reportedly has not had testing performed to evaluate circulation in the legs. Patient History Information obtained from Patient. Allergies Eliquis Family History Heart Disease - Father, Hypertension - Father, Seizures - Father, No family history of Cancer, Diabetes, Hereditary Spherocytosis, Kidney Disease, Lung Disease, Stroke, Thyroid Problems, Tuberculosis. Social History Never  smoker, Marital Status - Married, Alcohol Use - Never, Drug Use - No History, Caffeine Use - Daily. Medical History Eyes Denies history of Cataracts, Glaucoma, Optic Neuritis Ear/Nose/Mouth/Throat Denies history of Chronic sinus problems/congestion, Middle ear problems Hematologic/Lymphatic Denies history of Anemia, Hemophilia, Human Immunodeficiency Virus, Lymphedema, Sickle Cell Disease Respiratory Denies history of Aspiration, Asthma, Chronic Obstructive Pulmonary Disease (COPD), Pneumothorax, Sleep Apnea, Tuberculosis Preisler, Merranda C. ([id number]) Cardiovascular Patient has history of Arrhythmia - a fib, Congestive Heart Failure Denies history of Angina, Coronary Artery Disease, Deep Vein Thrombosis, Hypertension, Hypotension, Myocardial Infarction, Peripheral Arterial Disease, Peripheral Venous Disease, Phlebitis, Vasculitis Gastrointestinal Denies history of Cirrhosis , Colitis, Crohn s, Hepatitis A, Hepatitis B, Hepatitis C Endocrine Denies history of Type I Diabetes, Type II Diabetes Genitourinary Patient has history of End Stage Renal Disease - CKD stage 4 Immunological Denies history of Lupus Erythematosus, Raynaud s, Scleroderma Integumentary (Skin) Denies history of History of Burn, History of pressure wounds Musculoskeletal Denies history of Gout, Rheumatoid Arthritis, Osteoarthritis, Osteomyelitis Neurologic Denies history of Dementia, Neuropathy, Quadriplegia, Paraplegia, Seizure Disorder Oncologic Denies history of Received Chemotherapy, Received Radiation Psychiatric Denies history of Anorexia/bulimia, Confinement Anxiety Medical And Surgical History Notes Cardiovascular pericardial effusion Neurologic CVA Review of Systems (ROS) Constitutional Symptoms (General Health) Denies complaints or symptoms of Fatigue, Fever, Chills, Marked Weight Change. Eyes Complains or has symptoms of Glasses / Contacts - glasses. Denies complaints or symptoms of Dry Eyes,  Vision Changes. Ear/Nose/Mouth/Throat Denies complaints or symptoms of Difficult clearing ears, Sinusitis. Hematologic/Lymphatic Denies complaints or symptoms of Bleeding / Clotting Disorders, Human Immunodeficiency Virus. Respiratory Denies complaints or symptoms of Chronic or frequent coughs, Shortness of Breath. Cardiovascular Denies complaints or symptoms of Chest pain, LE edema. Gastrointestinal Denies complaints or symptoms of Frequent diarrhea, Nausea, Vomiting. Endocrine Denies complaints or symptoms of Hepatitis, Thyroid disease, Polydypsia (Excessive Thirst). Genitourinary Complains or has symptoms of Kidney failure/ Dialysis - CKD stage 4. Denies complaints or symptoms of Incontinence/dribbling. Immunological Denies complaints or symptoms of Hives, Itching. Integumentary (Skin) Complains or has symptoms of Wounds. Denies complaints or symptoms of Bleeding or bruising tendency, Breakdown, Swelling. Musculoskeletal Denies complaints or symptoms of Muscle Pain, Muscle Weakness. Neurologic Denies complaints or symptoms of Numbness/parasthesias, Focal/Weakness. Psychiatric Apgar, Tomeka C. ([id number]) Denies complaints or symptoms of Anxiety, Claustrophobia. Objective Constitutional sitting or standing blood pressure is within target range for patient.. pulse regular and within target range for patient.Marland Kitchen respirations regular, non-labored and within target range for patient.Marland Kitchen temperature within target range for patient.. Well- nourished and well-hydrated in no acute distress. Vitals Time Taken: 2:46 PM, Height: 65 in, Source: Measured, Weight: 115 lbs, Source: Measured, BMI: 19.1, Temperature: 97.6 F, Pulse: 71 bpm, Respiratory Rate: 16 breaths/min,  Blood Pressure: 99/50 mmHg. Eyes conjunctiva clear no eyelid edema noted. pupils equal round and reactive to light and accommodation. Ears, Nose, Mouth, and Throat no gross abnormality of ear auricles or external auditory  canals. normal hearing noted during conversation. mucus membranes moist. Respiratory normal breathing without difficulty. clear to auscultation bilaterally. Cardiovascular regular rate and rhythm with normal S1, S2. 2+ dorsalis pedis/posterior tibialis pulses. no clubbing, cyanosis, significant edema, Gastrointestinal (GI) soft, non-tender, non-distended, +BS. no ventral hernia noted. Musculoskeletal normal gait and posture. no significant deformity or arthritic changes, no loss or range of motion, no clubbing. Psychiatric Patient is not able to cooperate in decision making regarding care. Patient is oriented to person and place. pleasant and cooperative. General Notes: During evaluation I did explain to the patient that I felt like she did need to have this area sharply debride away in order to clear away the necrotic material and help this area to heal more appropriately. I explained that this needs to be covered at all times less she is in the shower. Again it was at this point while discussing the dressing changes and how this will be performed but it became clear that she is not going to be able to do this on parole to court. She has significant dimension at least significant enough to not be able to follow directions as such. She went back to asking me how often we should change the dressing when that was the first thing that I had told her and she confirmed every other day at that point. Nonetheless I think that she is going to require skilled nursing services to help with the dressing changes at this point. Integumentary (Hair, Skin) Wound #1 status is Open. Original cause of wound was Trauma. The wound is located on the Right,Anterior Lower Leg. The wound measures 2.1cm length x 1.5cm width x 0.1cm depth; 2.474cm^2 area and 0.247cm^3 volume. The wound is limited to skin breakdown. There is no tunneling or undermining noted. There is a none present amount of drainage noted. The  wound margin is flat and intact. There is no granulation within the wound bed. There is a large (67-100%) amount of necrotic tissue within the wound bed including Eschar. The periwound skin appearance exhibited: Ecchymosis. The periwound skin Nicolaou, Laynee C. ([id number]) appearance did not exhibit: Callus, Crepitus, Excoriation, Induration, Rash, Scarring, Dry/Scaly, Maceration, Atrophie Blanche, Cyanosis, Hemosiderin Staining, Mottled, Pallor, Rubor, Erythema. Periwound temperature was noted as No Abnormality. The periwound has tenderness on palpation. Assessment Active Problems ICD-10 Unspecified open wound, right lower leg, initial encounter Non-pressure chronic ulcer of other part of right lower leg with other specified severity Chronic kidney disease, stage 4 (severe) Paroxysmal atrial fibrillation Long term (current) use of anticoagulants Procedures Wound #1 Pre-procedure diagnosis of Wound #1 is a Trauma, Other located on the Right,Anterior Lower Leg . There was a Excisional Skin/Subcutaneous Tissue Debridement with a total area of 3.15 sq cm performed by STONE III, HOYT E., PA-C. With the following instrument(s): Curette to remove Non-Viable tissue/material. Material removed includes Eschar, Subcutaneous Tissue, and Slough after achieving pain control using Lidocaine. No specimens were taken. A time out was conducted at 15:39, prior to the start of the procedure. A Minimum amount of bleeding was controlled with Pressure. The procedure was tolerated well with a pain level of 2 throughout and a pain level of 2 following the procedure. Post Debridement Measurements: 2.1cm length x 1.5cm width x 0.2cm depth; 0.495cm^3 volume. Character of Wound/Ulcer Post Debridement is improved.  Post procedure Diagnosis Wound #1: Same as Pre-Procedure Plan Wound Cleansing: May Shower, gently pat wound dry prior to applying new dressing. - Clean wound with Dial Antibacterial soap ( gold soap  ) pump bottle Primary Wound Dressing: Wound #1 Right,Anterior Lower Leg: Hydrafera Blue Ready Transfer Secondary Dressing: Wound #1 Right,Anterior Lower Leg: [place] Dressing Change Frequency: Wound #1 Right,Anterior Lower Leg: Change Dressing Monday, Wednesday, Friday Follow-up Appointments: Wound #1 Right,Anterior Lower Leg: Return Appointment in 2 weeks. Lyvers, Verba C. ([id number]) Home Health: Wound #1 Right,Anterior Lower Leg: [place] for [place] Nurse may visit PRN to address patient s wound care needs. FACE TO FACE ENCOUNTER: MEDICARE and MEDICAID PATIENTS: I certify that this patient is under my care and that I had a face-to-face encounter that meets the physician face-to-face encounter requirements with this patient on this date. The encounter with the patient was in whole or in part for the following MEDICAL CONDITION: (primary reason for [employer]) MEDICAL NECESSITY: I certify, that based on my findings, NURSING services are a medically necessary home health service. HOME BOUND STATUS: I certify that my clinical findings support that this patient is homebound (i.e., Due to illness or injury, pt requires aid of supportive devices such as crutches, cane, wheelchairs, walkers, the use of special transportation or the assistance of another person to leave their place of residence. There is a normal inability to leave the home and doing so requires considerable and taxing effort. Other absences are for medical reasons / religious services and are infrequent or of short duration when for other reasons). If current dressing causes regression in wound condition, may D/C ordered dressing product/s and apply Normal Saline Moist Dressing daily until next [place] / Other MD appointment. [place] of regression in wound condition at [phone]. Please direct any NON-WOUND related issues/requests for orders  to patient's Primary Care Physician My suggestion is gonna be that we go ahead and initiate some skilled nursing services for the patient through home health in order to help with dressing changes and ensure this gets done appropriately so that it will heal appropriately. I do not believe she's gonna be able to follow the directions to do this on her own. We will subsequently see her back for reevaluation in two weeks time to see were things stand. If anything changes she will let me know. Please see above for specific wound care orders. We will see patient for re-evaluation in 2 week(s) here in the clinic. If anything worsens or changes patient will contact our office for additional recommendations. Electronic Signature(s) Signed: 08/07/2018 8:07:41 AM By: Worthy Keeler PA-C Entered By: Worthy Keeler on 08/07/2018 08:06:59 Huseman, Makaylia C. ([id number]) -------------------------------------------------------------------------------- ROS/PFSH Details Patient Name: Tullius, Anali C. Date of Service: 08/06/2018 2:30 PM Medical Record Number: [id number] Patient Account Number: [id number] Date of Birth/Sex: Dec 24, 1923 (84 y.o. F) Treating RN: Montey Hora Primary Care Provider: Glendon Axe Other Clinician: Referring Provider: Grayland Ormond Treating Provider/Extender: STONE III, HOYT Weeks in Treatment: 0 Information Obtained From Patient Wound History Do you currently have one or more open woundso Yes How many open wounds do you currently haveo 1 Approximately how long have you had your woundso 4 or 5 months How have you been treating your wound(s) until nowo aloe Has your wound(s) ever healed and then re-openedo No Have you had any lab work done in the past montho No Have you tested positive for an antibiotic resistant organism (MRSA,  VRE)o No Have you tested positive for osteomyelitis (bone infection)o No Have you had any tests for circulation on your legso  No Constitutional Symptoms (General Health) Complaints and Symptoms: Negative for: Fatigue; Fever; Chills; Marked Weight Change Eyes Complaints and Symptoms: Positive for: Glasses / Contacts - glasses Negative for: Dry Eyes; Vision Changes Medical History: Negative for: Cataracts; Glaucoma; Optic Neuritis Ear/Nose/Mouth/Throat Complaints and Symptoms: Negative for: Difficult clearing ears; Sinusitis Medical History: Negative for: Chronic sinus problems/congestion; Middle ear problems Hematologic/Lymphatic Complaints and Symptoms: Negative for: Bleeding / Clotting Disorders; Human Immunodeficiency Virus Medical History: Negative for: Anemia; Hemophilia; Human Immunodeficiency Virus; Lymphedema; Sickle Cell Disease Respiratory Complaints and Symptoms: Negative for: Chronic or frequent coughs; Shortness of Breath Medical History: Mcphillips, Bettyjane C. ([id number]) Negative for: Aspiration; Asthma; Chronic Obstructive Pulmonary Disease (COPD); Pneumothorax; Sleep Apnea; Tuberculosis Cardiovascular Complaints and Symptoms: Negative for: Chest pain; LE edema Medical History: Positive for: Arrhythmia - a fib; Congestive Heart Failure Negative for: Angina; Coronary Artery Disease; Deep Vein Thrombosis; Hypertension; Hypotension; Myocardial Infarction; Peripheral Arterial Disease; Peripheral Venous Disease; Phlebitis; Vasculitis Past Medical History Notes: pericardial effusion Gastrointestinal Complaints and Symptoms: Negative for: Frequent diarrhea; Nausea; Vomiting Medical History: Negative for: Cirrhosis ; Colitis; Crohnos; Hepatitis A; Hepatitis B; Hepatitis C Endocrine Complaints and Symptoms: Negative for: Hepatitis; Thyroid disease; Polydypsia (Excessive Thirst) Medical History: Negative for: Type I Diabetes; Type II Diabetes Genitourinary Complaints and Symptoms: Positive for: Kidney failure/ Dialysis - CKD stage 4 Negative for: Incontinence/dribbling Medical  History: Positive for: End Stage Renal Disease - CKD stage 4 Immunological Complaints and Symptoms: Negative for: Hives; Itching Medical History: Negative for: Lupus Erythematosus; Raynaudos; Scleroderma Integumentary (Skin) Complaints and Symptoms: Positive for: Wounds Negative for: Bleeding or bruising tendency; Breakdown; Swelling Medical History: Negative for: History of Burn; History of pressure wounds Musculoskeletal Geller, Keerthana C. ([id number]) Complaints and Symptoms: Negative for: Muscle Pain; Muscle Weakness Medical History: Negative for: Gout; Rheumatoid Arthritis; Osteoarthritis; Osteomyelitis Neurologic Complaints and Symptoms: Negative for: Numbness/parasthesias; Focal/Weakness Medical History: Negative for: Dementia; Neuropathy; Quadriplegia; Paraplegia; Seizure Disorder Past Medical History Notes: CVA Psychiatric Complaints and Symptoms: Negative for: Anxiety; Claustrophobia Medical History: Negative for: Anorexia/bulimia; Confinement Anxiety Oncologic Medical History: Negative for: Received Chemotherapy; Received Radiation Immunizations Pneumococcal Vaccine: Received Pneumococcal Vaccination: Yes Immunization Notes: up to date Implantable Devices Family and Social History Cancer: No; Diabetes: No; Heart Disease: Yes - Father; Hereditary Spherocytosis: No; Hypertension: Yes - Father; Kidney Disease: No; Lung Disease: No; Seizures: Yes - Father; Stroke: No; Thyroid Problems: No; Tuberculosis: No; Never smoker; Marital Status - Married; Alcohol Use: Never; Drug Use: No History; Caffeine Use: Daily; Financial Concerns: No; Food, Clothing or Shelter Needs: No; Support System Lacking: No; Transportation Concerns: No; Advanced Directives: Yes; Living Will: Yes Electronic Signature(s) Signed: 08/06/2018 4:55:08 PM By: Montey Hora Signed: 08/07/2018 8:07:41 AM By: Worthy Keeler PA-C Entered By: Montey Hora on 08/06/2018 14:55:40 Krasner, Joanny C.  ([id number]) -------------------------------------------------------------------------------- SuperBill Details Patient Name: Dizon, Zhane C. Date of Service: 08/06/2018 Medical Record Number: [id number] Patient Account Number: [id number] Date of Birth/Sex: January 21, 1924 (84 y.o. F) Treating RN: Harold Barban Primary Care Provider: Glendon Axe Other Clinician: Referring Provider: Grayland Ormond Treating Provider/Extender: STONE III, HOYT Weeks in Treatment: 0 Diagnosis Coding ICD-10 Codes Code Description S81.801A Unspecified open wound, right lower leg, initial encounter L97.818 Non-pressure chronic ulcer of other part of right lower leg with other specified severity N18.4 Chronic kidney disease, stage 4 (severe) I48.0 Paroxysmal atrial fibrillation Z79.01 Long term (current) use of anticoagulants Facility Procedures CPT4 Code Description:  [id number] [place] VISIT-LEV 3 EST PT Modifier: Quantity: 1 CPT4 Code Description: 99068934 11042 - DEB SUBQ TISSUE 20 SQ CM/< ICD-10 Diagnosis Description L97.818 Non-pressure chronic ulcer of other part of right lower leg with Modifier: other specified Quantity: 1 severity Physician Procedures CPT4 Code Description: [id number] WC PHYS LEVEL 3 o NEW PT ICD-10 Diagnosis Description S81.801A Unspecified open wound, right lower leg, initial encounter L97.818 Non-pressure chronic ulcer of other part of right lower leg with N18.4 Chronic kidney  disease, stage 4 (severe) I48.0 Paroxysmal atrial fibrillation Modifier: 25 other specified Quantity: 1 severity CPT4 Code Description: 5331740 99278 - WC PHYS SUBQ TISS 20 SQ CM ICD-10 Diagnosis Description L97.818 Non-pressure chronic ulcer of other part of right lower leg with Modifier: other specified Quantity: 1 severity Electronic Signature(s) Signed: 08/07/2018 8:07:41 AM By: Worthy Keeler PA-C Entered By: Worthy Keeler on 08/07/2018 08:07:26

## 2018-08-08 NOTE — Progress Notes (Signed)
[name], [name] ([id number]) Visit Report for 08/06/2018 Allergy List Details Patient Name: [name], [name] Date of Service: 08/06/2018 2:30 PM Medical Record Number: [id number] Patient Account Number: [id number] Date of Birth/Sex: [birth year] (84 y.o. F) Treating RN: Cristina [name] Primary Care Cristina [name]: Cristina [name] Other Clinician: Referring Cristina [name]: Cristina [name] Treating Cristina [name]/Extender: Cristina [name] Weeks in Treatment: 0 Allergies Active Allergies Eliquis Allergy Notes Electronic Signature(s) Signed: 08/06/2018 4:55:08 PM By: Cristina [name] Entered By: Cristina [name] on 08/06/2018 14:48:11 [name], [name] ([id number]) -------------------------------------------------------------------------------- Arrival Information Details Patient Name: [name], [name] Date of Service: 08/06/2018 2:30 PM Medical Record Number: [id number] Patient Account Number: [id number] Date of Birth/Sex: 1923/09/25 (84 y.o. F) Treating RN: Cristina [name] Primary Care Dashia Caldeira: Cristina [name] Other Clinician: Referring Cristina [name]: Cristina [name] Treating Cristina [name]/Extender: Cristina [name], [name] Weeks in Treatment: 0 Visit Information Patient Arrived: Cane Arrival Time: 14:41 Accompanied By: self Transfer Assistance: None Patient Identification Verified: Yes Secondary Verification Process Yes Completed: Patient Has Alerts: Yes Patient Alerts: Patient on Blood Thinner warfarin Electronic Signature(s) Signed: 08/06/2018 4:55:08 PM By: Cristina [name] Entered By: Cristina [name] on 08/06/2018 14:45:17 [name], [name] ([id number]) -------------------------------------------------------------------------------- Clinic Level of Care Assessment Details Patient Name: Cristina, Tarika C. Date of Service: 08/06/2018 2:30 PM Medical Record Number: [id number] Patient Account Number: [id number] Date of Birth/Sex: [birth year] (84 y.o. F) Treating RN: Cristina [name] Primary Care Vega Stare:  Cristina [name] Other Clinician: Referring Arien Morine: Cristina [name] Treating Nikelle Malatesta/Extender: Cristina [name] Weeks in Treatment: 0 Clinic Level of Care Assessment Items TOOL 1 Quantity Score []  - Use when EandM and Procedure is performed on INITIAL visit 0 ASSESSMENTS - Nursing Assessment / Reassessment X - General Physical Exam (combine w/ comprehensive assessment (listed just below) when 1 20 performed on new pt. evals) X- 1 25 Comprehensive Assessment (HX, ROS, Risk Assessments, Wounds Hx, etc.) ASSESSMENTS - Wound and Skin Assessment / Reassessment []  - Dermatologic / Skin Assessment (not related to wound area) 0 ASSESSMENTS - Ostomy and/or Continence Assessment and Care []  - Incontinence Assessment and Management 0 []  - 0 Ostomy Care Assessment and Management (repouching, etc.) PROCESS - Coordination of Care X - Simple Patient / Family Education for ongoing care 1 15 []  - 0 Complex (extensive) Patient / Family Education for ongoing care X- 1 10 Staff obtains Programmer, systems, Records, Test Results / Process Orders []  - 0 Staff telephones HHA, Nursing Homes / Clarify orders / etc []  - 0 Routine Transfer to another Facility (non-emergent condition) []  - 0 Routine Hospital Admission (non-emergent condition) X- 1 15 New Admissions / Biomedical engineer / Ordering NPWT, Apligraf, etc. []  - 0 Emergency Hospital Admission (emergent condition) PROCESS - Special Needs []  - Pediatric / Minor Patient Management 0 []  - 0 Isolation Patient Management []  - 0 Hearing / Language / Visual special needs []  - 0 Assessment of Community assistance (transportation, D/C planning, etc.) []  - 0 Additional assistance / Altered mentation []  - 0 Support Surface(s) Assessment (bed, cushion, seat, etc.) [name], [name] ([id number]) INTERVENTIONS - Miscellaneous []  - External ear exam 0 []  - 0 Patient Transfer (multiple staff / Civil Service fast streamer / Similar devices) []  - 0 Simple Staple / Suture  removal (25 or less) []  - 0 Complex Staple / Suture removal (26 or more) []  - 0 Hypo/Hyperglycemic Management (do not check if billed separately) X- 1 15 Ankle / Brachial Index (ABI) - do not check if billed separately Has the patient been seen at the hospital within the last three years: Yes Total Score:  100 Level Of Care: New/Established - Level 3 Electronic Signature(s) Signed: 08/07/2018 1:37:43 PM By: Cristina [name] Entered By: Cristina [name] on 08/06/2018 15:42:34 [name], [name] ([id number]) -------------------------------------------------------------------------------- Lower Extremity Assessment Details Patient Name: [name], [name] Date of Service: 08/06/2018 2:30 PM Medical Record Number: [id number] Patient Account Number: [id number] Date of Birth/Sex: 14-Aug-1924 (84 y.o. F) Treating RN: Cristina [name] Primary Care Lamiyah Schlotter: Cristina [name] Other Clinician: Referring Cristina [name]: Cristina [name] Treating Cristina [name]/Extender: Cristina [name] Weeks in Treatment: 0 Edema Assessment Assessed: [Left: No] [Right: No] [Left: Edema] [Right: :] Calf Left: Right: Point of Measurement: 30 cm From Medial Instep 34 cm 34.6 cm Ankle Left: Right: Point of Measurement: 10 cm From Medial Instep 21.2 cm 21.5 cm Vascular Assessment Pulses: Dorsalis Pedis Palpable: [Left:Yes] [Right:Yes] Doppler Audible: [Left:Yes] [Right:Yes] Posterior Tibial Palpable: [Left:Yes] [Right:Yes] Doppler Audible: [Left:Yes] [Right:Yes] Extremity colors, hair growth, and conditions: Extremity Color: [Left:Hyperpigmented] [Right:Hyperpigmented] Hair Growth on Extremity: [Left:Yes] [Right:Yes] Temperature of Extremity: [Left:Warm] [Right:Warm] Capillary Refill: [Left:< 3 seconds] [Right:< 3 seconds] Blood Pressure: Brachial: [Left:96] Dorsalis Pedis: 110 [Left:Dorsalis Pedis: 122] Ankle: Posterior Tibial: 104 [Left:Posterior Tibial: 108 1.15] [Right:1.27] Toe Nail Assessment Left:  Right: Thick: Yes Yes Discolored: Yes Yes Deformed: No No Improper Length and Hygiene: No No Electronic Signature(s) Signed: 08/06/2018 4:55:08 PM By: Cristina [name] Entered By: Cristina [name] on 08/06/2018 15:10:43 Timson, Kyler C. ([id number]) Lawhead, Keshonda C. ([id number]) -------------------------------------------------------------------------------- Multi Wound Chart Details Patient Name: [name], [name] Date of Service: 08/06/2018 2:30 PM Medical Record Number: [id number] Patient Account Number: [id number] Date of Birth/Sex: August 12, 1924 (84 y.o. F) Treating RN: Cristina [name] Primary Care Pope Brunty: Cristina [name] Other Clinician: Referring Luqman Perrelli: Cristina [name] Treating Reginald Weida/Extender: Cristina [name] Weeks in Treatment: 0 Vital Signs Height(in): 65 Pulse(bpm): 71 Weight(lbs): 115 Blood Pressure(mmHg): 99/50 Body Mass Index(BMI): 19 Temperature(F): 97.6 Respiratory Rate 16 (breaths/min): Photos: [1:No Photos] [N/A:N/A] Wound Location: [1:Right Lower Leg - Anterior] [N/A:N/A] Wounding Event: [1:Trauma] [N/A:N/A] Primary Etiology: [1:Trauma, Other] [N/A:N/A] Comorbid History: [1:Arrhythmia, Congestive Heart Failure, End Stage Renal Disease] [N/A:N/A] Date Acquired: [1:03/12/2018] [N/A:N/A] Weeks of Treatment: [1:0] [N/A:N/A] Wound Status: [1:Open] [N/A:N/A] Measurements L x W x D [1:2.1x1.5x0.1] [N/A:N/A] (cm) Area (cm) : [1:2.474] [N/A:N/A] Volume (cm) : [1:0.247] [N/A:N/A] Classification: [1:Unclassifiable] [N/A:N/A] Exudate Amount: [1:None Present] [N/A:N/A] Wound Margin: [1:Flat and Intact] [N/A:N/A] Granulation Amount: [1:None Present (0%)] [N/A:N/A] Necrotic Amount: [1:Large (67-100%)] [N/A:N/A] Necrotic Tissue: [1:Eschar] [N/A:N/A] Exposed Structures: [1:Fascia: No Fat Layer (Subcutaneous Tissue) Exposed: No Tendon: No Muscle: No Joint: No Bone: No Limited to Skin Breakdown] [N/A:N/A] Epithelialization: [1:None] [N/A:N/A] Periwound Skin  Texture: [1:Excoriation: No Induration: No Callus: No Crepitus: No Rash: No Scarring: No] [N/A:N/A] Periwound Skin Moisture: [1:Maceration: No Dry/Scaly: No] [N/A:N/A] Periwound Skin Color: Ecchymosis: Yes N/A N/A Atrophie Blanche: No Cyanosis: No Erythema: No Hemosiderin Staining: No Mottled: No Pallor: No Rubor: No Temperature: No Abnormality N/A N/A Tenderness on Palpation: Yes N/A N/A Wound Preparation: Ulcer Cleansing: N/A N/A Rinsed/Irrigated with Saline Topical Anesthetic Applied: Other: lidocaine 4% Treatment Notes Electronic Signature(s) Signed: 08/07/2018 1:37:43 PM By: Cristina [name] Entered By: Cristina [name] on 08/06/2018 15:36:36 Franze, Ronnette C. ([id number]) -------------------------------------------------------------------------------- [place] Details Patient Name: [name], [name] Date of Service: 08/06/2018 2:30 PM Medical Record Number: [id number] Patient Account Number: [id number] Date of Birth/Sex: 1924/02/22 (84 y.o. F) Treating RN: Cristina [name] Primary Care Lianny Molter: Cristina [name] Other Clinician: Referring Arlinda Barcelona: Cristina [name] Treating Lova Urbieta/Extender: Cristina [name] Weeks in Treatment: 0 Active Inactive Wound/Skin Impairment Nursing Diagnoses: Impaired tissue integrity Knowledge deficit related to ulceration/compromised skin integrity  Goals: Ulcer/skin breakdown will have a volume reduction of 30% by week 4 Date Initiated: 08/06/2018 Target Resolution Date: 09/06/2018 Goal Status: Active Interventions: Assess patient/caregiver ability to obtain necessary supplies Assess patient/caregiver ability to perform ulcer/skin care regimen upon admission and as needed Assess ulceration(s) every visit Notes: Electronic Signature(s) Signed: 08/07/2018 1:37:43 PM By: Cristina [name] Entered By: Cristina [name] on 08/06/2018 15:36:21 [name], [name]  ([id number]) -------------------------------------------------------------------------------- Pain Assessment Details Patient Name: [name], [name] Date of Service: 08/06/2018 2:30 PM Medical Record Number: [id number] Patient Account Number: [id number] Date of Birth/Sex: 03-20-1924 (84 y.o. F) Treating RN: Cristina [name] Primary Care Santiago Graf: Cristina [name] Other Clinician: Referring Mallorey Odonell: Cristina [name] Treating Reisha Wos/Extender: Cristina [name] Weeks in Treatment: 0 Active Problems Location of Pain Severity and Description of Pain Patient Has Paino No Site Locations Pain Management and Medication Current Pain Management: Electronic Signature(s) Signed: 08/06/2018 4:55:08 PM By: Cristina [name] Entered By: Cristina [name] on 08/06/2018 14:45:23 [name], [name] ([id number]) -------------------------------------------------------------------------------- Patient/Caregiver Education Details Patient Name: [name], [name] Date of Service: 08/06/2018 2:30 PM Medical Record Number: [id number] Patient Account Number: [id number] Date of Birth/Gender: 11-Oct-1923 (84 y.o. F) Treating RN: Cristina [name] Primary Care Physician: Cristina [name] Other Clinician: Referring Physician: Grayland [name] Treating Physician/Extender: Cristina [name], [name] Weeks in Treatment: 0 Education Assessment Education Provided To: Patient Education Topics Provided Wound/Skin Impairment: Handouts: Caring for Your Ulcer Methods: Demonstration, Explain/Verbal Responses: State content correctly Electronic Signature(s) Signed: 08/07/2018 1:37:43 PM By: Cristina [name] Entered By: Cristina [name] on 08/06/2018 15:36:51 Debose, [name] ([id number]) -------------------------------------------------------------------------------- Wound Assessment Details Patient Name: Haynes, Jamesia C. Date of Service: 08/06/2018 2:30 PM Medical Record Number: [id number] Patient Account Number: [id number] Date of  Birth/Sex: 04/27/1924 (84 y.o. F) Treating RN: Cristina [name] Primary Care Sarahmarie Leavey: Cristina [name] Other Clinician: Referring Sandrea Boer: Cristina [name] Treating Irmgard Rampersaud/Extender: Cristina [name] Weeks in Treatment: 0 Wound Status Wound Number: 1 Primary Trauma, Other Etiology: Wound Location: Right Lower Leg - Anterior Wound Status: Open Wounding Event: Trauma Comorbid Arrhythmia, Congestive Heart Failure, End Date Acquired: 03/12/2018 History: Stage Renal Disease Weeks Of Treatment: 0 Clustered Wound: No Photos Photo Uploaded By: Cristina [name] on 08/06/2018 16:56:51 Wound Measurements Length: (cm) 2.1 Width: (cm) 1.5 Depth: (cm) 0.1 Area: (cm) 2.474 Volume: (cm) 0.247 % Reduction in Area: % Reduction in Volume: Epithelialization: None Tunneling: No Undermining: No Wound Description Classification: Unclassifiable Wound Margin: Flat and Intact Exudate Amount: None Present Foul Odor After Cleansing: No Slough/Fibrino No Wound Bed Granulation Amount: None Present (0%) Exposed Structure Necrotic Amount: Large (67-100%) Fascia Exposed: No Necrotic Quality: Eschar Fat Layer (Subcutaneous Tissue) Exposed: No Tendon Exposed: No Muscle Exposed: No Joint Exposed: No Bone Exposed: No Limited to Skin Breakdown Periwound Skin Texture Texture Color Broberg, Mikella C. ([id number]) No Abnormalities Noted: No No Abnormalities Noted: No Callus: No Atrophie Blanche: No Crepitus: No Cyanosis: No Excoriation: No Ecchymosis: Yes Induration: No Erythema: No Rash: No Hemosiderin Staining: No Scarring: No Mottled: No Pallor: No Moisture Rubor: No No Abnormalities Noted: No Dry / Scaly: No Temperature / Pain Maceration: No Temperature: No Abnormality Tenderness on Palpation: Yes Wound Preparation Ulcer Cleansing: Rinsed/Irrigated with Saline Topical Anesthetic Applied: Other: lidocaine 4%, Electronic Signature(s) Signed: 08/06/2018 4:55:08 PM By: Cristina [name] Entered By: Cristina [name] on 08/06/2018 15:01:23 Lager, Bethanee C. ([id number]) -------------------------------------------------------------------------------- Vitals Details Patient Name: Vergara, Yaeko C. Date of Service: 08/06/2018 2:30 PM Medical Record Number: [id number] Patient Account Number: [id number] Date of Birth/Sex: 04-05-1924 (84 y.o. F) Treating RN: Cristina [name] Primary Care Kenji Mapel:  [facility], [place] Other Clinician: Referring Judene Logue: Cristina [name] Treating Ziah Leandro/Extender: Cristina [name] Weeks in Treatment: 0 Vital Signs Time Taken: 14:46 Temperature (F): 97.6 Height (in): 65 Pulse (bpm): 71 Source: Measured Respiratory Rate (breaths/min): 16 Weight (lbs): 115 Blood Pressure (mmHg): 99/50 Source: Measured Reference Range: 80 - 120 mg / dl Body Mass Index (BMI): 19.1 Electronic Signature(s) Signed: 08/06/2018 4:55:08 PM By: Cristina [name] Entered By: Cristina [name] on 08/06/2018 14:47:05

## 2018-08-20 ENCOUNTER — Encounter: Payer: Medicare Other | Attending: Physician Assistant | Admitting: Physician Assistant

## 2018-08-20 DIAGNOSIS — I480 Paroxysmal atrial fibrillation: Secondary | ICD-10-CM | POA: Diagnosis not present

## 2018-08-20 DIAGNOSIS — X58XXXA Exposure to other specified factors, initial encounter: Secondary | ICD-10-CM | POA: Diagnosis not present

## 2018-08-20 DIAGNOSIS — Z7901 Long term (current) use of anticoagulants: Secondary | ICD-10-CM | POA: Insufficient documentation

## 2018-08-20 DIAGNOSIS — L97818 Non-pressure chronic ulcer of other part of right lower leg with other specified severity: Secondary | ICD-10-CM | POA: Diagnosis not present

## 2018-08-20 DIAGNOSIS — I509 Heart failure, unspecified: Secondary | ICD-10-CM | POA: Insufficient documentation

## 2018-08-20 DIAGNOSIS — Z8673 Personal history of transient ischemic attack (TIA), and cerebral infarction without residual deficits: Secondary | ICD-10-CM | POA: Insufficient documentation

## 2018-08-20 DIAGNOSIS — S81801A Unspecified open wound, right lower leg, initial encounter: Secondary | ICD-10-CM | POA: Insufficient documentation

## 2018-08-20 DIAGNOSIS — N184 Chronic kidney disease, stage 4 (severe): Secondary | ICD-10-CM | POA: Diagnosis not present

## 2018-08-24 NOTE — Progress Notes (Signed)
Aicher, Elonna C. ([id number]) Visit Report for 08/20/2018 Chief Complaint Document Details Patient Name: Cristina [name], [name] Date of Service: 08/20/2018 1:00 PM Medical Record Number: [id number] Patient Account Number: [id number] Date of Birth/Sex: 01-31-1924 (85 y.o. F) Treating RN: Harold Barban Primary Care Provider: Glendon Axe Other Clinician: Referring Provider: Glendon Axe Treating Provider/Extender: STONE III, HOYT Weeks in Treatment: 2 Information Obtained from: Patient Chief Complaint Right anterior LE Ulcer Electronic Signature(s) Signed: 08/21/2018 7:11:38 AM By: Worthy Keeler PA-C Entered By: Worthy Keeler on 08/20/2018 13:29:36 Cristina [name], [name] ([id number]) -------------------------------------------------------------------------------- HPI Details Patient Name: Cristina [name], [name] Date of Service: 08/20/2018 1:00 PM Medical Record Number: [id number] Patient Account Number: [id number] Date of Birth/Sex: January 25, 1924 (85 y.o. F) Treating RN: Harold Barban Primary Care Provider: Glendon Axe Other Clinician: Referring Provider: Glendon Axe Treating Provider/Extender: Melburn Hake, HOYT Weeks in Treatment: 2 History of Present Illness HPI Description: 08/06/18 on evaluation today patient actually presents for initial evaluation concerning a right anterior lower extremity ulcer which has been present for 4-5 months. She is a very poor historian really does not know if she tripped or how in fact she fell and injured this area. Upon further questioning it appears she actually has dimension I will explain a scenario such as how to perform the dressing changes and then she will ask me questions such as how often it should be done when I very specifically pointed this out to her. I definitely think that there is a strong component of dementia here leading to her poor history. She does have a history of atrial fibrillation as will a stroke and is on blood thinners  chronically for this. She also has stage IV chronic kidney disease. Patient's wound over the anterior lower extremity of the right leg actually show signs of being completely eschar covered upon my initial evaluation today. No fevers, chills, nausea, or vomiting noted at this time. 08/20/18 on evaluation today patient's wound actually appears to be doing significantly better which is excellent news. She has been having her daughter change the dressing's which I think has worked out great for her. With that being said we had previously ordered home health although they never actually got started simply due to the fact that I was concerned about the patient's dimension of her being able to actually perform the dressing changes appropriately of her own accord. Nonetheless with her daughter taking care of this for her I see no issues and they seem to be doing excellent at this point. Electronic Signature(s) Signed: 08/21/2018 7:11:38 AM By: Worthy Keeler PA-C Entered By: Worthy Keeler on 08/21/2018 06:27:39 Cristina [name], [name] ([id number]) -------------------------------------------------------------------------------- Physical Exam Details Patient Name: Guice, Fia C. Date of Service: 08/20/2018 1:00 PM Medical Record Number: [id number] Patient Account Number: [id number] Date of Birth/Sex: [birth year] (85 y.o. F) Treating RN: Harold Barban Primary Care Provider: Glendon Axe Other Clinician: Referring Provider: Glendon Axe Treating Provider/Extender: STONE III, HOYT Weeks in Treatment: 2 Constitutional Well-nourished and well-hydrated in no acute distress. Respiratory normal breathing without difficulty. clear to auscultation bilaterally. Cardiovascular regular rate and rhythm with normal S1, S2. Psychiatric this patient is able to make decisions and demonstrates good insight into disease process. Alert and Oriented x 3. pleasant and cooperative. Notes My suggestion currently is  that no sharp debridement was necessary especially in light of the fact that she was having such tremendous pain at this time. Therefore we avoided that. She does have a little bit of edema of this right lower  extremity unfortunately but again I do not think the wrap is going to be helpful for her simply due to the fact that she is having the discomfort she is at the site. Nonetheless what we Seabron Spates do is use Tubigrip so that the dressing can be changed more frequently and this will help somewhat with the swelling. Electronic Signature(s) Signed: 08/21/2018 7:11:38 AM By: Worthy Keeler PA-C Entered By: Worthy Keeler on 08/21/2018 24:23:53 Cristina [name], Cristina [name] ([id number]) -------------------------------------------------------------------------------- Physician Orders Details Patient Name: Cristina [name], [name] Date of Service: 08/20/2018 1:00 PM Medical Record Number: [id number] Patient Account Number: [id number] Date of Birth/Sex: [birth year] (85 y.o. F) Treating RN: Harold Barban Primary Care Provider: Glendon Axe Other Clinician: Referring Provider: Glendon Axe Treating Provider/Extender: STONE III, HOYT Weeks in Treatment: 2 Verbal / Phone Orders: No Diagnosis Coding ICD-10 Coding Code Description S81.801A Unspecified open wound, right lower leg, initial encounter L97.818 Non-pressure chronic ulcer of other part of right lower leg with other specified severity N18.4 Chronic kidney disease, stage 4 (severe) I48.0 Paroxysmal atrial fibrillation Z79.01 Long term (current) use of anticoagulants Wound Cleansing o May Shower, gently pat wound dry prior to applying new dressing. - Clean wound with Dial Antibacterial soap ( gold soap ) pump bottle Primary Wound Dressing Wound #1 Right,Anterior Lower Leg o Hydrafera Blue Ready Transfer Secondary Dressing Wound #1 Right,Anterior Lower Leg o Conform/Kerlix o [place]arr Dressing Change Frequency Wound #1 Right,Anterior  Lower Leg o Change Dressing Monday, Wednesday, Friday Follow-up Appointments Wound #1 Right,Anterior Lower Leg o Return Appointment in 2 weeks. Edema Control Wound #1 Right,Anterior Lower Leg o Other: - Tubi grip Electronic Signature(s) Signed: 08/21/2018 7:11:38 AM By: Worthy Keeler PA-C Signed: 08/23/2018 2:41:13 PM By: Harold Barban Entered By: Harold Barban on 08/20/2018 13:55:50 Cristina [name], [name] ([id number]) -------------------------------------------------------------------------------- Problem List Details Patient Name: Cristina [name], [name] Date of Service: 08/20/2018 1:00 PM Medical Record Number: [id number] Patient Account Number: [id number] Date of Birth/Sex: August 13, 1924 (85 y.o. F) Treating RN: Harold Barban Primary Care Provider: Glendon Axe Other Clinician: Referring Provider: Glendon Axe Treating Provider/Extender: Melburn Hake, HOYT Weeks in Treatment: 2 Active Problems ICD-10 Evaluated Encounter Code Description Active Date Today Diagnosis S81.801A Unspecified open wound, right lower leg, initial encounter 08/06/2018 No Yes L97.818 Non-pressure chronic ulcer of other part of right lower leg 08/06/2018 No Yes with other specified severity N18.4 Chronic kidney disease, stage 4 (severe) 08/06/2018 No Yes I48.0 Paroxysmal atrial fibrillation 08/06/2018 No Yes Z79.01 Long term (current) use of anticoagulants 08/06/2018 No Yes Inactive Problems Resolved Problems Electronic Signature(s) Signed: 08/21/2018 7:11:38 AM By: Worthy Keeler PA-C Entered By: Worthy Keeler on 08/20/2018 13:29:28 Cristina [name], [name] ([id number]) -------------------------------------------------------------------------------- Progress Note Details Patient Name: Cristina [name], [name] Date of Service: 08/20/2018 1:00 PM Medical Record Number: [id number] Patient Account Number: [id number] Date of Birth/Sex: July 18, 1924 (85 y.o. F) Treating RN: Harold Barban Primary Care Provider:  Glendon Axe Other Clinician: Referring Provider: Glendon Axe Treating Provider/Extender: Melburn Hake, HOYT Weeks in Treatment: 2 Subjective Chief Complaint Information obtained from Patient Right anterior LE Ulcer History of Present Illness (HPI) 08/06/18 on evaluation today patient actually presents for initial evaluation concerning a right anterior lower extremity ulcer which has been present for 4-5 months. She is a very poor historian really does not know if she tripped or how in fact she fell and injured this area. Upon further questioning it appears she actually has dimension I will explain a scenario such as how to perform the dressing changes and then  she will ask me questions such as how often it should be done when I very specifically pointed this out to her. I definitely think that there is a strong component of dementia here leading to her poor history. She does have a history of atrial fibrillation as will a stroke and is on blood thinners chronically for this. She also has stage IV chronic kidney disease. Patient's wound over the anterior lower extremity of the right leg actually show signs of being completely eschar covered upon my initial evaluation today. No fevers, chills, nausea, or vomiting noted at this time. 08/20/18 on evaluation today patient's wound actually appears to be doing significantly better which is excellent news. She has been having her daughter change the dressing's which I think has worked out great for her. With that being said we had previously ordered home health although they never actually got started simply due to the fact that I was concerned about the patient's dimension of her being able to actually perform the dressing changes appropriately of her own accord. Nonetheless with her daughter taking care of this for her I see no issues and they seem to be doing excellent at this point. Patient History Information obtained from Patient. Family  History Heart Disease - Father, Hypertension - Father, Seizures - Father, No family history of Cancer, Diabetes, Hereditary Spherocytosis, Kidney Disease, Lung Disease, Stroke, Thyroid Problems, Tuberculosis. Social History Never smoker, Marital Status - Married, Alcohol Use - Never, Drug Use - No History, Caffeine Use - Daily. Medical And Surgical History Notes Cardiovascular pericardial effusion Neurologic CVA Review of Systems (ROS) Constitutional Symptoms (General Health) Denies complaints or symptoms of Fever, Chills. Respiratory The patient has no complaints or symptoms. Cardiovascular Complains or has symptoms of LE edema. Psychiatric Levings, Lyvonne C. ([id number]) The patient has no complaints or symptoms. Objective Constitutional Well-nourished and well-hydrated in no acute distress. Vitals Time Taken: 1:14 PM, Height: 65 in, Weight: 115 lbs, BMI: 19.1, Temperature: 98.0 F, Pulse: 61 bpm, Respiratory Rate: 16 breaths/min, Blood Pressure: 122/49 mmHg. Respiratory normal breathing without difficulty. clear to auscultation bilaterally. Cardiovascular regular rate and rhythm with normal S1, S2. Psychiatric this patient is able to make decisions and demonstrates good insight into disease process. Alert and Oriented x 3. pleasant and cooperative. General Notes: My suggestion currently is that no sharp debridement was necessary especially in light of the fact that she was having such tremendous pain at this time. Therefore we avoided that. She does have a little bit of edema of this right lower extremity unfortunately but again I do not think the wrap is going to be helpful for her simply due to the fact that she is having the discomfort she is at the site. Nonetheless what we Seabron Spates do is use Tubigrip so that the dressing can be changed more frequently and this will help somewhat with the swelling. Integumentary (Hair, Skin) Wound #1 status is Open. Original cause of wound  was Trauma. The wound is located on the Right,Anterior Lower Leg. The wound measures 0.9cm length x 0.7cm width x 0.1cm depth; 0.495cm^2 area and 0.049cm^3 volume. There is Fat Layer (Subcutaneous Tissue) Exposed exposed. There is no tunneling or undermining noted. There is a none present amount of drainage noted. The wound margin is flat and intact. There is no granulation within the wound bed. There is a large (67-100%) amount of necrotic tissue within the wound bed including Eschar. The periwound skin appearance exhibited: Scarring, Ecchymosis. The periwound skin appearance did not exhibit:  Callus, Crepitus, Excoriation, Induration, Rash, Dry/Scaly, Maceration, Atrophie Blanche, Cyanosis, Hemosiderin Staining, Mottled, Pallor, Rubor, Erythema. Periwound temperature was noted as No Abnormality. The periwound has tenderness on palpation. Assessment Active Problems ICD-10 Unspecified open wound, right lower leg, initial encounter Non-pressure chronic ulcer of other part of right lower leg with other specified severity Chronic kidney disease, stage 4 (severe) Paroxysmal atrial fibrillation Cristina [name], [name] ([id number]) Long term (current) use of anticoagulants Plan Wound Cleansing: May Shower, gently pat wound dry prior to applying new dressing. - Clean wound with Dial Antibacterial soap ( gold soap ) pump bottle Primary Wound Dressing: Wound #1 Right,Anterior Lower Leg: Hydrafera Blue Ready Transfer Secondary Dressing: Wound #1 Right,Anterior Lower Leg: Conform/Kerlix [place] Dressing Change Frequency: Wound #1 Right,Anterior Lower Leg: Change Dressing Monday, Wednesday, Friday Follow-up Appointments: Wound #1 Right,Anterior Lower Leg: Return Appointment in 2 weeks. Edema Control: Wound #1 Right,Anterior Lower Leg: Other: - Tubi grip I'm gonna suggest currently that we go ahead and continue with the above wound care measures. The patient is in agreement with plan. We will  subsequently see were things stand at follow-up. Please see above for specific wound care orders. We will see patient for re-evaluation in two week(s) here in the clinic. If anything worsens or changes patient will contact our office for additional recommendations. Electronic Signature(s) Signed: 08/21/2018 7:11:38 AM By: Worthy Keeler PA-C Entered By: Worthy Keeler on 08/21/2018 06:28:53 Cristina [name], Cristina [name] Kitchen ([id number]) -------------------------------------------------------------------------------- ROS/PFSH Details Patient Name: Steffenhagen, Shiasia C. Date of Service: 08/20/2018 1:00 PM Medical Record Number: [id number] Patient Account Number: [id number] Date of Birth/Sex: 24-Jun-1924 (85 y.o. F) Treating RN: Harold Barban Primary Care Provider: Glendon Axe Other Clinician: Referring Provider: Glendon Axe Treating Provider/Extender: STONE III, HOYT Weeks in Treatment: 2 Information Obtained From Patient Wound History Do you currently have one or more open woundso Yes How many open wounds do you currently haveo 1 Approximately how long have you had your woundso 4 or 5 months How have you been treating your wound(s) until nowo aloe Has your wound(s) ever healed and then re-openedo No Have you had any lab work done in the past montho No Have you tested positive for an antibiotic resistant organism (MRSA, VRE)o No Have you tested positive for osteomyelitis (bone infection)o No Have you had any tests for circulation on your legso No Constitutional Symptoms (General Health) Complaints and Symptoms: Negative for: Fever; Chills Cardiovascular Complaints and Symptoms: Positive for: LE edema Medical History: Positive for: Arrhythmia - a fib; Congestive Heart Failure Negative for: Angina; Coronary Artery Disease; Deep Vein Thrombosis; Hypertension; Hypotension; Myocardial Infarction; Peripheral Arterial Disease; Peripheral Venous Disease; Phlebitis; Vasculitis Past Medical History  Notes: pericardial effusion Eyes Medical History: Negative for: Cataracts; Glaucoma; Optic Neuritis Ear/Nose/Mouth/Throat Medical History: Negative for: Chronic sinus problems/congestion; Middle ear problems Hematologic/Lymphatic Medical History: Negative for: Anemia; Hemophilia; Human Immunodeficiency Virus; Lymphedema; Sickle Cell Disease Respiratory Complaints and Symptoms: No Complaints or Symptoms Mullens, Lanique C. ([id number]) Medical History: Negative for: Aspiration; Asthma; Chronic Obstructive Pulmonary Disease (COPD); Pneumothorax; Sleep Apnea; Tuberculosis Gastrointestinal Medical History: Negative for: Cirrhosis ; Colitis; Crohnos; Hepatitis A; Hepatitis B; Hepatitis C Endocrine Medical History: Negative for: Type I Diabetes; Type II Diabetes Genitourinary Medical History: Positive for: End Stage Renal Disease - CKD stage 4 Immunological Medical History: Negative for: Lupus Erythematosus; Raynaudos; Scleroderma Integumentary (Skin) Medical History: Negative for: History of Burn; History of pressure wounds Musculoskeletal Medical History: Negative for: Gout; Rheumatoid Arthritis; Osteoarthritis; Osteomyelitis Neurologic Medical History: Negative for: Dementia; Neuropathy; Quadriplegia;  Paraplegia; Seizure Disorder Past Medical History Notes: CVA Oncologic Medical History: Negative for: Received Chemotherapy; Received Radiation Psychiatric Complaints and Symptoms: No Complaints or Symptoms Medical History: Negative for: Anorexia/bulimia; Confinement Anxiety Immunizations Pneumococcal Vaccine: Received Pneumococcal Vaccination: Yes Immunization Notes: Bouie, Jerene C. ([id number]) up to date Implantable Devices Family and Social History Cancer: No; Diabetes: No; Heart Disease: Yes - Father; Hereditary Spherocytosis: No; Hypertension: Yes - Father; Kidney Disease: No; Lung Disease: No; Seizures: Yes - Father; Stroke: No; Thyroid Problems: No;  Tuberculosis: No; Never smoker; Marital Status - Married; Alcohol Use: Never; Drug Use: No History; Caffeine Use: Daily; Financial Concerns: No; Food, Clothing or Shelter Needs: No; Support System Lacking: No; Transportation Concerns: No; Advanced Directives: Yes (Not Provided); Patient does not want information on Advanced Directives; Living Will: Yes (Not Provided) Physician Affirmation I have reviewed and agree with the above information. Electronic Signature(s) Signed: 08/21/2018 7:11:38 AM By: Worthy Keeler PA-C Signed: 08/23/2018 2:41:13 PM By: Harold Barban Entered By: Worthy Keeler on 08/21/2018 06:27:58 Seelman, Shandora C. ([id number]) -------------------------------------------------------------------------------- SuperBill Details Patient Name: Penick, Ysabela C. Date of Service: 08/20/2018 Medical Record Number: [id number] Patient Account Number: [id number] Date of Birth/Sex: 10-17-1923 (85 y.o. F) Treating RN: Harold Barban Primary Care Provider: Glendon Axe Other Clinician: Referring Provider: Glendon Axe Treating Provider/Extender: STONE III, HOYT Weeks in Treatment: 2 Diagnosis Coding ICD-10 Codes Code Description S81.801A Unspecified open wound, right lower leg, initial encounter L97.818 Non-pressure chronic ulcer of other part of right lower leg with other specified severity N18.4 Chronic kidney disease, stage 4 (severe) I48.0 Paroxysmal atrial fibrillation Z79.01 Long term (current) use of anticoagulants Facility Procedures CPT4 Code: [id number] Description: 99213 - WOUND CARE VISIT-LEV 3 EST PT Modifier: Quantity: 1 Physician Procedures CPT4 Code Description: [id number] - WC PHYS LEVEL 4 - EST PT ICD-10 Diagnosis Description S81.801A Unspecified open wound, right lower leg, initial encounter L97.818 Non-pressure chronic ulcer of other part of right lower leg wit N18.4 Chronic kidney  disease, stage 4 (severe) I48.0 Paroxysmal atrial  fibrillation Modifier: h other specified Quantity: 1 severity Electronic Signature(s) Signed: 08/21/2018 7:11:38 AM By: Worthy Keeler PA-C Entered By: Worthy Keeler on 08/21/2018 06:29:11

## 2018-08-24 NOTE — Progress Notes (Signed)
Pavek, [name] ([id number]) Visit Report for 08/20/2018 Arrival Information Details Patient Name: Cristina [name], [name] Date of Service: 08/20/2018 1:00 PM Medical Record Number: [id number] Patient Account Number: [id number] Date of Birth/Sex: May 29, 1924 (85 y.o. F) Treating RN: Cristina [name] Primary Care Cristina [name]: Glendon [name] Other Clinician: Referring Cristina [name] Treating Cristina [name]/Extender: Cristina [name] Cristina [name]: 2 Visit Information History Since Last Visit Added or deleted any medications: No Patient Arrived: Ambulatory Any new allergies or adverse reactions: No Arrival Time: 13:12 Had a fall or experienced change in No Accompanied By: daughter activities of daily living that may affect Transfer Assistance: None risk of falls: Patient Identification Verified: Yes Signs or symptoms of abuse/neglect since last visito No Secondary Verification Process Yes Hospitalized since last visit: No Completed: Implantable device outside of the clinic excluding No Patient Has Alerts: Yes cellular tissue based products placed in the center Patient Alerts: Patient on Blood since last visit: Thinner Has Dressing in Place as Prescribed: Yes warfarin Pain Present Now: No Electronic Signature(s) Signed: 08/20/2018 3:59:13 PM By: Cristina [name] RCP, RRT, CHT Entered By: Cristina [name] on 08/20/2018 13:14:01 Cristina [name], [name] ([id number]) -------------------------------------------------------------------------------- Clinic Level of Care Assessment Details Patient Name: Cristina [name], [name] Date of Service: 08/20/2018 1:00 PM Medical Record Number: [id number] Patient Account Number: [id number] Date of Birth/Sex: [birth year] (85 y.o. F) Treating RN: Cristina [name] Primary Care Cristina [name] Other Clinician: Referring Cristina [name]: Glendon [name] Treating Cristina [name]/Extender: Cristina [name] Cristina [name]: 2 Clinic Level of Care  Assessment Items TOOL 4 Quantity Score []  - Use when only an EandM is performed on FOLLOW-UP visit 0 ASSESSMENTS - Nursing Assessment / Reassessment X - Reassessment of Co-morbidities (includes updates in patient status) 1 10 X- 1 5 Reassessment of Adherence to [name] Plan ASSESSMENTS - Wound and Skin Assessment / Reassessment X - Simple Wound Assessment / Reassessment - one wound 1 5 []  - 0 Complex Wound Assessment / Reassessment - multiple wounds []  - 0 Dermatologic / Skin Assessment (not related to wound area) ASSESSMENTS - Focused Assessment []  - Circumferential Edema Measurements - multi extremities 0 []  - 0 Nutritional Assessment / Counseling / Intervention []  - 0 Lower Extremity Assessment (monofilament, tuning fork, pulses) []  - 0 Peripheral Arterial Disease Assessment (using hand held doppler) ASSESSMENTS - Ostomy and/or Continence Assessment and Care []  - Incontinence Assessment and Management 0 []  - 0 Ostomy Care Assessment and Management (repouching, etc.) PROCESS - Coordination of Care X - Simple Patient / Family Education for ongoing care 1 15 []  - 0 Complex (extensive) Patient / Family Education for ongoing care X- 1 10 Staff obtains Programmer, systems, Records, Test Results / Process Orders []  - 0 Staff telephones HHA, Nursing Homes / Clarify orders / etc []  - 0 Routine Transfer to another Facility (non-emergent condition) []  - 0 Routine Hospital Admission (non-emergent condition) []  - 0 New Admissions / Biomedical engineer / Ordering NPWT, Apligraf, etc. []  - 0 Emergency Hospital Admission (emergent condition) X- 1 10 Simple Discharge Coordination [name], [name] ([id number]) []  - 0 Complex (extensive) Discharge Coordination PROCESS - Special Needs []  - Pediatric / Minor Patient Management 0 []  - 0 Isolation Patient Management []  - 0 Hearing / Language / Visual special needs []  - 0 Assessment of Community assistance (transportation, D/C planning,  etc.) []  - 0 Additional assistance / Altered mentation []  - 0 Support Surface(s) Assessment (bed, cushion, seat, etc.) INTERVENTIONS - Wound Cleansing / Measurement X - Simple Wound Cleansing - one wound  1 5 []  - 0 Complex Wound Cleansing - multiple wounds X- 1 5 Wound Imaging (photographs - any number of wounds) []  - 0 Wound Tracing (instead of photographs) X- 1 5 Simple Wound Measurement - one wound []  - 0 Complex Wound Measurement - multiple wounds INTERVENTIONS - Wound Dressings X - Small Wound Dressing one or multiple wounds 1 10 []  - 0 Medium Wound Dressing one or multiple wounds []  - 0 Large Wound Dressing one or multiple wounds []  - 0 Application of Medications - topical []  - 0 Application of Medications - injection INTERVENTIONS - Miscellaneous []  - External ear exam 0 []  - 0 Specimen Collection (cultures, biopsies, blood, body fluids, etc.) []  - 0 Specimen(s) / Culture(s) sent or taken to Lab for analysis []  - 0 Patient Transfer (multiple staff / Civil Service fast streamer / Similar devices) []  - 0 Simple Staple / Suture removal (25 or less) []  - 0 Complex Staple / Suture removal (26 or more) []  - 0 Hypo / Hyperglycemic Management (close monitor of Blood Glucose) []  - 0 Ankle / Brachial Index (ABI) - do not check if billed separately X- 1 5 Vital Signs Cristina [name], [name] ([id number]) Has the patient been seen at the hospital within the last three years: Yes Total Score: 85 Level Of Care: New/Established - Level 3 Electronic Signature(s) Signed: 08/23/2018 2:41:13 PM By: Cristina [name] Entered By: Cristina [name] on 08/20/2018 13:52:26 Cristina [name], [name] ([id number]) -------------------------------------------------------------------------------- Encounter Discharge Information Details Patient Name: [name], [name] Date of Service: 08/20/2018 1:00 PM Medical Record Number: [id number] Patient Account Number: [id number] Date of Birth/Sex: [birth year] (85 y.o.  F) Treating RN: Montey Hora Primary Care Fabiola Mudgett: Glendon [name] Other Clinician: Referring Cristina [name]: Glendon [name] Treating Alto Gandolfo/Extender: Melburn Hake, [name] Cristina [name]: 2 Encounter Discharge Information Items Discharge Condition: Stable Ambulatory Status: Ambulatory Discharge Destination: Home Transportation: Private Auto Accompanied By: self Schedule Follow-up Appointment: Yes Clinical Summary of Care: Electronic Signature(s) Signed: 08/21/2018 7:48:07 AM By: Montey Hora Entered By: Montey Hora on 08/21/2018 07:48:07 Cristina [name], [name] ([id number]) -------------------------------------------------------------------------------- Lower Extremity Assessment Details Patient Name: Imperato, Diera C. Date of Service: 08/20/2018 1:00 PM Medical Record Number: [id number] Patient Account Number: [id number] Date of Birth/Sex: 08/14/1924 (85 y.o. F) Treating RN: Secundino Ginger Primary Care Licet Dunphy: Glendon [name] Other Clinician: Referring Muath Hallam: Glendon [name] Treating Kirby Cortese/Extender: Cristina [name] Cristina [name]: 2 Edema Assessment Assessed: [Left: No] [Right: No] [Left: Edema] [Right: :] Calf Left: Right: Point of Measurement: 30 cm From Medial Instep cm 32.5 cm Ankle Left: Right: Point of Measurement: 10 cm From Medial Instep cm 21 cm Vascular Assessment Claudication: Claudication Assessment [Right:None] Pulses: Dorsalis Pedis Palpable: [Right:Yes] Posterior Tibial Extremity colors, hair growth, and conditions: Extremity Color: [Right:Red] Hair Growth on Extremity: [Right:No] Temperature of Extremity: [Right:Cool] Capillary Refill: [Right:< 3 seconds] Toe Nail Assessment Left: Right: Thick: No Discolored: No Deformed: No Improper Length and Hygiene: No Electronic Signature(s) Signed: 08/20/2018 4:23:06 PM By: Secundino Ginger Entered By: Secundino Ginger on 08/20/2018 13:31:12 Cristina [name], [name]  ([id number]) -------------------------------------------------------------------------------- Multi Wound Chart Details Patient Name: Swiney, Bethany C. Date of Service: 08/20/2018 1:00 PM Medical Record Number: [id number] Patient Account Number: [id number] Date of Birth/Sex: 12/05/1923 (85 y.o. F) Treating RN: Cristina [name] Primary Care Jheremy Boger: Glendon [name] Other Clinician: Referring Malayia Spizzirri: Glendon [name] Treating Katerra Ingman/Extender: Cristina [name] Cristina [name]: 2 Vital Signs Height(in): 65 Pulse(bpm): 61 Weight(lbs): 115 Blood Pressure(mmHg): 122/49 Body Mass Index(BMI): 19 Temperature(F): 98.0 Respiratory Rate 16 (breaths/min): Photos: [N/A:N/A] Wound Location: Right Lower Leg -  Anterior N/A N/A Wounding Event: Trauma N/A N/A Primary Etiology: Trauma, Other N/A N/A Comorbid History: Arrhythmia, Congestive Heart N/A N/A Failure, End Stage Renal Disease Date Acquired: 03/12/2018 N/A N/A Cristina of [name]: 2 N/A N/A Wound Status: Open N/A N/A Measurements L x W x D 0.9x0.7x0.1 N/A N/A (cm) Area (cm) : 0.495 N/A N/A Volume (cm) : 0.049 N/A N/A % Reduction in Area: 80.00% N/A N/A % Reduction in Volume: 80.20% N/A N/A Classification: Unclassifiable N/A N/A Exudate Amount: None Present N/A N/A Wound Margin: Flat and Intact N/A N/A Granulation Amount: None Present (0%) N/A N/A Necrotic Amount: Large (67-100%) N/A N/A Necrotic Tissue: Eschar N/A N/A Exposed Structures: Fat Layer (Subcutaneous N/A N/A Tissue) Exposed: Yes Fascia: No Tendon: No Muscle: No Joint: No Bone: No Epithelialization: None N/A N/A Cristina [name], [name] ([id number]) Periwound Skin Texture: Scarring: Yes N/A N/A Excoriation: No Induration: No Callus: No Crepitus: No Rash: No Periwound Skin Moisture: Maceration: No N/A N/A Dry/Scaly: No Periwound Skin Color: Ecchymosis: Yes N/A N/A Atrophie Blanche: No Cyanosis: No Erythema: No Hemosiderin Staining: No Mottled:  No Pallor: No Rubor: No Temperature: No Abnormality N/A N/A Tenderness on Palpation: Yes N/A N/A Wound Preparation: Ulcer Cleansing: N/A N/A Rinsed/Irrigated with Saline Topical Anesthetic Applied: Other: lidocaine 4% [name] Notes Electronic Signature(s) Signed: 08/23/2018 2:41:13 PM By: Cristina [name] Entered By: Cristina [name] on 08/20/2018 13:50:50 Cristina [name], [name] ([id number]) -------------------------------------------------------------------------------- [place] Details Patient Name: Cristina [name], [name] Date of Service: 08/20/2018 1:00 PM Medical Record Number: [id number] Patient Account Number: [id number] Date of Birth/Sex: Apr 13, 1924 (85 y.o. F) Treating RN: Cristina [name] Primary Care Willo Yoon: Glendon [name] Other Clinician: Referring Lyvonne Cassell: Glendon [name] Treating Olaf Mesa/Extender: Cristina [name] Cristina [name]: 2 Active Inactive Wound/Skin Impairment Nursing Diagnoses: Impaired tissue integrity Knowledge deficit related to ulceration/compromised skin integrity Goals: Ulcer/skin breakdown will have a volume reduction of 30% by week 4 Date Initiated: 08/06/2018 Target Resolution Date: 09/06/2018 Goal Status: Active Interventions: Assess patient/caregiver ability to obtain necessary supplies Assess patient/caregiver ability to perform ulcer/skin care regimen upon admission and as needed Assess ulceration(s) every visit Notes: Electronic Signature(s) Signed: 08/23/2018 2:41:13 PM By: Cristina [name] Entered By: Cristina [name] on 08/20/2018 13:50:15 Cristina [name], [name] ([id number]) -------------------------------------------------------------------------------- Pain Assessment Details Patient Name: Bondar, Rheagan C. Date of Service: 08/20/2018 1:00 PM Medical Record Number: [id number] Patient Account Number: [id number] Date of Birth/Sex: 1924/07/15 (85 y.o. F) Treating RN: Cristina [name] Primary Care Jillian Pianka: Glendon [name] Other  Clinician: Referring Jamerius Boeckman: Glendon [name] Treating Glorimar Stroope/Extender: Cristina [name] Cristina [name]: 2 Active Problems Location of Pain Severity and Description of Pain Patient Has Paino No Site Locations Pain Management and Medication Current Pain Management: Electronic Signature(s) Signed: 08/20/2018 3:59:13 PM By: Cristina [name] RCP, RRT, CHT Signed: 08/23/2018 2:41:13 PM By: Cristina [name] Entered By: Cristina [name] on 08/20/2018 13:14:12 Cristina [name], [name] ([id number]) -------------------------------------------------------------------------------- Patient/Caregiver Education Details Patient Name: Cristina [name], [name] Date of Service: 08/20/2018 1:00 PM Medical Record Number: [id number] Patient Account Number: [id number] Date of Birth/Gender: 1924-07-25 (85 y.o. F) Treating RN: Cristina [name] Primary Care Physician: Glendon [name] Other Clinician: Referring Physician: Glendon [name] Treating Physician/Extender: Sharalyn Ink in [name]: 2 Education Assessment Education Provided To: Patient Education Topics Provided Electronic Signature(s) Signed: 08/23/2018 2:41:13 PM By: Cristina [name] Entered By: Cristina [name] on 08/20/2018 13:51:38 Cristina [name], [name] ([id number]) -------------------------------------------------------------------------------- Wound Assessment Details Patient Name: Cristina [name], Gaye C. Date of Service: 08/20/2018 1:00 PM Medical Record Number: [id number] Patient Account Number: [id number] Date of Birth/Sex: 03-08-1924 (85  y.o. F) Treating RN: Secundino Ginger Primary Care Chalmer Zheng: Glendon [name] Other Clinician: Referring Savvas Roper: Glendon [name] Treating Ryatt Corsino/Extender: Cristina [name] Cristina [name]: 2 Wound Status Wound Number: 1 Primary Trauma, Other Etiology: Wound Location: Right Lower Leg - Anterior Wound Status: Open Wounding Event: Trauma Comorbid Arrhythmia, Congestive Heart Failure, End Date  Acquired: 03/12/2018 History: Stage Renal Disease Cristina Of [name]: 2 Clustered Wound: No Photos Photo Uploaded By: Secundino Ginger on 08/20/2018 13:39:06 Wound Measurements Length: (cm) 0.9 Width: (cm) 0.7 Depth: (cm) 0.1 Area: (cm) 0.495 Volume: (cm) 0.049 % Reduction in Area: 80% % Reduction in Volume: 80.2% Epithelialization: None Tunneling: No Undermining: No Wound Description Classification: Unclassifiable Foul Odor Wound Margin: Flat and Intact Slough/Fi Exudate Amount: None Present After Cleansing: No brino No Wound Bed Granulation Amount: None Present (0%) Exposed Structure Necrotic Amount: Large (67-100%) Fascia Exposed: No Necrotic Quality: Eschar Fat Layer (Subcutaneous Tissue) Exposed: Yes Tendon Exposed: No Muscle Exposed: No Joint Exposed: No Bone Exposed: No Periwound Skin Texture Texture Color No Abnormalities Noted: No No Abnormalities Noted: No Pasko, Kionna C. ([id number]) Callus: No Atrophie Blanche: No Crepitus: No Cyanosis: No Excoriation: No Ecchymosis: Yes Induration: No Erythema: No Rash: No Hemosiderin Staining: No Scarring: Yes Mottled: No Pallor: No Moisture Rubor: No No Abnormalities Noted: No Dry / Scaly: No Temperature / Pain Maceration: No Temperature: No Abnormality Tenderness on Palpation: Yes Wound Preparation Ulcer Cleansing: Rinsed/Irrigated with Saline Topical Anesthetic Applied: Other: lidocaine 4%, [name] Notes Wound #1 (Right, Anterior Lower Leg) Notes hydrafera blue, [place]a [place] Electronic Signature(s) Signed: 08/20/2018 4:23:06 PM By: Secundino Ginger Entered By: Secundino Ginger on 08/20/2018 13:28:35 Magnone, Ray C. ([id number]) -------------------------------------------------------------------------------- Vitals Details Patient Name: Trowbridge, Crystle C. Date of Service: 08/20/2018 1:00 PM Medical Record Number: [id number] Patient Account Number: [id number] Date of Birth/Sex: [birth year] (85 y.o.  F) Treating RN: Cristina [name] Primary Care Eulas Schweitzer: Glendon [name] Other Clinician: Referring Avary Pitsenbarger: Glendon [name] Treating Keyondra Lagrand/Extender: Cristina [name] Cristina [name]: 2 Vital Signs Time Taken: 13:14 Temperature (F): 98.0 Height (in): 65 Pulse (bpm): 61 Weight (lbs): 115 Respiratory Rate (breaths/min): 16 Body Mass Index (BMI): 19.1 Blood Pressure (mmHg): 122/49 Reference Range: 80 - 120 mg / dl Electronic Signature(s) Signed: 08/20/2018 3:59:13 PM By: Cristina [name] RCP, RRT, CHT Entered By: Cristina [name] on 08/20/2018 13:18:08

## 2018-09-03 ENCOUNTER — Encounter: Payer: Medicare Other | Admitting: Physician Assistant

## 2018-09-03 DIAGNOSIS — S81801A Unspecified open wound, right lower leg, initial encounter: Secondary | ICD-10-CM | POA: Diagnosis not present

## 2018-09-04 NOTE — Progress Notes (Signed)
Raffo, Camaryn C. ([id number]) Visit Report for 09/03/2018 Arrival Information Details Patient Name: Cotrell, Payten C. Date of Service: 09/03/2018 12:30 PM Medical Record Number: [id number] Patient Account Number: [id number] Date of Birth/Sex: 25-Sep-1923 (85 y.o. F) Treating RN: Secundino Ginger Primary Care Jabriel Vanduyne: Glendon Axe Other Clinician: Referring Elany Felix: Glendon Axe Treating Judea Fennimore/Extender: STONE III, HOYT Weeks in Treatment: 4 Visit Information History Since Last Visit Added or deleted any medications: No Patient Arrived: Ambulatory Any new allergies or adverse reactions: No Arrival Time: 12:33 Had a fall or experienced change in No Accompanied By: family activities of daily living that may affect Transfer Assistance: None risk of falls: Patient Identification Verified: Yes Signs or symptoms of abuse/neglect since last visito No Secondary Verification Process Yes Hospitalized since last visit: No Completed: Implantable device outside of the clinic excluding No Patient Has Alerts: Yes cellular tissue based products placed in the center Patient Alerts: Patient on Blood since last visit: Thinner Has Dressing in Place as Prescribed: No warfarin Pain Present Now: No Electronic Signature(s) Signed: 09/03/2018 1:48:10 PM By: Secundino Ginger Entered By: Secundino Ginger on 09/03/2018 12:34:25 Hockenberry, Nelly C. ([id number]) -------------------------------------------------------------------------------- Lower Extremity Assessment Details Patient Name: Jarchow, Etheleen C. Date of Service: 09/03/2018 12:30 PM Medical Record Number: [id number] Patient Account Number: [id number] Date of Birth/Sex: 1923/09/29 (85 y.o. F) Treating RN: Secundino Ginger Primary Care Melva Faux: Glendon Axe Other Clinician: Referring Tijah Hane: Glendon Axe Treating Adolphe Fortunato/Extender: STONE III, HOYT Weeks in Treatment: 4 Edema Assessment Assessed: [Left: No] [Right: No] [Left: Edema] [Right:  :] Calf Left: Right: Point of Measurement: 30 cm From Medial Instep cm 32 cm Ankle Left: Right: Point of Measurement: 10 cm From Medial Instep cm 20 cm Vascular Assessment Claudication: Claudication Assessment [Right:None] Pulses: Posterior Tibial Extremity colors, hair growth, and conditions: Extremity Color: [Right:Hyperpigmented] Hair Growth on Extremity: [Right:No] Temperature of Extremity: [Right:Cool] Capillary Refill: [Right:< 3 seconds] Toe Nail Assessment Left: Right: Thick: No Discolored: No Deformed: No Improper Length and Hygiene: No Electronic Signature(s) Signed: 09/03/2018 1:48:10 PM By: Secundino Ginger Entered By: Secundino Ginger on 09/03/2018 12:39:58 Finnie, Joniece C. ([id number]) -------------------------------------------------------------------------------- Multi Wound Chart Details Patient Name: Albany, Monisha C. Date of Service: 09/03/2018 12:30 PM Medical Record Number: [id number] Patient Account Number: [id number] Date of Birth/Sex: 07/09/1924 (85 y.o. F) Treating RN: Harold Barban Primary Care Byran Bilotti: Glendon Axe Other Clinician: Referring Jamonica Schoff: Glendon Axe Treating Iviona Hole/Extender: STONE III, HOYT Weeks in Treatment: 4 Vital Signs Height(in): 65 Pulse(bpm): 73 Weight(lbs): 115 Blood Pressure(mmHg): 100/71 Body Mass Index(BMI): 19 Temperature(F): 97.7 Respiratory Rate 16 (breaths/min): Photos: [1:No Photos] [N/A:N/A] Wound Location: [1:Right Lower Leg - Anterior] [N/A:N/A] Wounding Event: [1:Trauma] [N/A:N/A] Primary Etiology: [1:Trauma, Other] [N/A:N/A] Comorbid History: [1:Arrhythmia, Congestive Heart Failure, End Stage Renal Disease] [N/A:N/A] Date Acquired: [1:03/12/2018] [N/A:N/A] Weeks of Treatment: [1:4] [N/A:N/A] Wound Status: [1:Open] [N/A:N/A] Measurements L x W x D [1:0.6x0.5x0.1] [N/A:N/A] (cm) Area (cm) : [1:0.236] [N/A:N/A] Volume (cm) : [1:0.024] [N/A:N/A] % Reduction in Area: [1:90.50%] [N/A:N/A] %  Reduction in Volume: [1:90.30%] [N/A:N/A] Classification: [1:Unclassifiable] [N/A:N/A] Exudate Amount: [1:None Present] [N/A:N/A] Wound Margin: [1:Flat and Intact] [N/A:N/A] Granulation Amount: [1:None Present (0%)] [N/A:N/A] Necrotic Amount: [1:Large (67-100%)] [N/A:N/A] Necrotic Tissue: [1:Eschar] [N/A:N/A] Exposed Structures: [1:Fat Layer (Subcutaneous Tissue) Exposed: Yes Fascia: No Tendon: No Muscle: No Joint: No Bone: No] [N/A:N/A] Epithelialization: [1:None] [N/A:N/A] Periwound Skin Texture: [1:Scarring: Yes Excoriation: No Induration: No Callus: No Crepitus: No Rash: No] [N/A:N/A] Periwound Skin Moisture: [N/A:N/A] Maceration: No Dry/Scaly: No Periwound Skin Color: Ecchymosis: Yes N/A N/A Atrophie Blanche: No Cyanosis: No Erythema: No Hemosiderin  Staining: No Mottled: No Pallor: No Rubor: No Temperature: No Abnormality N/A N/A Tenderness on Palpation: Yes N/A N/A Wound Preparation: Ulcer Cleansing: N/A N/A Rinsed/Irrigated with Saline Topical Anesthetic Applied: Other: lidocaine 4% Treatment Notes Electronic Signature(s) Signed: 09/03/2018 5:13:49 PM By: Harold Barban Entered By: Harold Barban on 09/03/2018 12:45:04 Oo, Iyona C. ([id number]) -------------------------------------------------------------------------------- [place] Details Patient Name: Whirley, Cedra C. Date of Service: 09/03/2018 12:30 PM Medical Record Number: [id number] Patient Account Number: [id number] Date of Birth/Sex: [birth year] (85 y.o. F) Treating RN: Harold Barban Primary Care Lenola Lockner: Glendon Axe Other Clinician: Referring Ralston Venus: Glendon Axe Treating Gurley Climer/Extender: STONE III, HOYT Weeks in Treatment: 4 Active Inactive Wound/Skin Impairment Nursing Diagnoses: Impaired tissue integrity Knowledge deficit related to ulceration/compromised skin integrity Goals: Ulcer/skin breakdown will have a volume reduction of 30% by week 4 Date  Initiated: 08/06/2018 Target Resolution Date: 09/06/2018 Goal Status: Active Interventions: Assess patient/caregiver ability to obtain necessary supplies Assess patient/caregiver ability to perform ulcer/skin care regimen upon admission and as needed Assess ulceration(s) every visit Notes: Electronic Signature(s) Signed: 09/03/2018 5:13:49 PM By: Harold Barban Entered By: Harold Barban on 09/03/2018 12:44:54 Lienau, Aroush C. ([id number]) -------------------------------------------------------------------------------- Pain Assessment Details Patient Name: Davern, Shawnika C. Date of Service: 09/03/2018 12:30 PM Medical Record Number: [id number] Patient Account Number: [id number] Date of Birth/Sex: 1924/04/01 (85 y.o. F) Treating RN: Secundino Ginger Primary Care Henry Demeritt: Glendon Axe Other Clinician: Referring Rhiann Boucher: Glendon Axe Treating Jelicia Nantz/Extender: STONE III, HOYT Weeks in Treatment: 4 Active Problems Location of Pain Severity and Description of Pain Patient Has Paino No Site Locations Pain Management and Medication Current Pain Management: Notes pt denies any pain at this time. Electronic Signature(s) Signed: 09/03/2018 1:48:10 PM By: Secundino Ginger Entered By: Secundino Ginger on 09/03/2018 12:34:43 [place]au, Kenneisha C. ([id number]) -------------------------------------------------------------------------------- Patient/Caregiver Education Details Patient Name: Nutting, Mykhia C. Date of Service: 09/03/2018 12:30 PM Medical Record Number: [id number] Patient Account Number: [id number] Date of Birth/Gender: Apr 30, 1924 (85 y.o. F) Treating RN: Harold Barban Primary Care Physician: Glendon Axe Other Clinician: Referring Physician: Glendon Axe Treating Physician/Extender: Sharalyn Ink in Treatment: 4 Education Assessment Education Provided To: Patient Education Topics Provided Wound/Skin Impairment: Handouts: Caring for Your Ulcer Methods: Demonstration,  Explain/Verbal Responses: State content correctly Electronic Signature(s) Signed: 09/03/2018 5:13:49 PM By: Harold Barban Entered By: Harold Barban on 09/03/2018 12:45:32 Narramore, Maheen C. ([id number]) -------------------------------------------------------------------------------- Wound Assessment Details Patient Name: Reagor, Rema C. Date of Service: 09/03/2018 12:30 PM Medical Record Number: [id number] Patient Account Number: [id number] Date of Birth/Sex: 05-Nov-1923 (85 y.o. F) Treating RN: Secundino Ginger Primary Care Adenike Shidler: Glendon Axe Other Clinician: Referring Keaton Stirewalt: Glendon Axe Treating Greene Diodato/Extender: STONE III, HOYT Weeks in Treatment: 4 Wound Status Wound Number: 1 Primary Trauma, Other Etiology: Wound Location: Right Lower Leg - Anterior Wound Status: Open Wounding Event: Trauma Comorbid Arrhythmia, Congestive Heart Failure, End Date Acquired: 03/12/2018 History: Stage Renal Disease Weeks Of Treatment: 4 Clustered Wound: No Wound Measurements Length: (cm) 0.6 Width: (cm) 0.5 Depth: (cm) 0.1 Area: (cm) 0.236 Volume: (cm) 0.024 % Reduction in Area: 90.5% % Reduction in Volume: 90.3% Epithelialization: None Tunneling: No Undermining: No Wound Description Classification: Unclassifiable Foul Od Wound Margin: Flat and Intact Slough/ Exudate Amount: None Present or After Cleansing: No Fibrino No Wound Bed Granulation Amount: None Present (0%) Exposed Structure Necrotic Amount: Large (67-100%) Fascia Exposed: No Necrotic Quality: Eschar Fat Layer (Subcutaneous Tissue) Exposed: Yes Tendon Exposed: No Muscle Exposed: No Joint Exposed: No Bone Exposed: No Periwound Skin Texture Texture Color No Abnormalities Noted: No No Abnormalities Noted:  No Callus: No Atrophie Blanche: No Crepitus: No Cyanosis: No Excoriation: No Ecchymosis: Yes Induration: No Erythema: No Rash: No Hemosiderin Staining: No Scarring: Yes Mottled: No Pallor:  No Moisture Rubor: No No Abnormalities Noted: No Dry / Scaly: No Temperature / Pain Maceration: No Temperature: No Abnormality Tenderness on Palpation: Yes Wound Preparation Lui, Cedar C. ([id number]) Ulcer Cleansing: Rinsed/Irrigated with Saline Topical Anesthetic Applied: Other: lidocaine 4%, Electronic Signature(s) Signed: 09/03/2018 1:48:10 PM By: Secundino Ginger Entered By: Secundino Ginger on 09/03/2018 12:37:29 Paget, Ragena C. ([id number]) -------------------------------------------------------------------------------- Vitals Details Patient Name: Hoadley, Drake C. Date of Service: 09/03/2018 12:30 PM Medical Record Number: [id number] Patient Account Number: [id number] Date of Birth/Sex: Oct 30, 1923 (85 y.o. F) Treating RN: Secundino Ginger Primary Care Kenedie Dirocco: Glendon Axe Other Clinician: Referring Debany Vantol: Glendon Axe Treating Koleen Celia/Extender: STONE III, HOYT Weeks in Treatment: 4 Vital Signs Time Taken: 12:30 Temperature (F): 97.7 Height (in): 65 Pulse (bpm): 67 Weight (lbs): 115 Respiratory Rate (breaths/min): 16 Body Mass Index (BMI): 19.1 Blood Pressure (mmHg): 100/71 Reference Range: 80 - 120 mg / dl Electronic Signature(s) Signed: 09/03/2018 1:48:10 PM By: Secundino Ginger Entered BySecundino Ginger on 09/03/2018 12:35:17

## 2018-09-04 NOTE — Progress Notes (Signed)
Neyman, Bettymae C. ([id number]) Visit Report for 09/03/2018 Chief Complaint Document Details Patient Name: [name], [name] Date of Service: 09/03/2018 12:30 PM Medical Record Number: [id number] Patient Account Number: [id number] Date of Birth/Sex: October 11, 1923 (85 y.o. F) Treating RN: Harold Barban Primary Care Provider: Glendon Axe Other Clinician: Referring Provider: Glendon Axe Treating Provider/Extender: STONE III, HOYT Weeks in Treatment: 4 Information Obtained from: Patient Chief Complaint Right anterior LE Ulcer Electronic Signature(s) Signed: 09/03/2018 9:58:15 PM By: Worthy Keeler PA-C Entered By: Worthy Keeler on 09/03/2018 12:43:04 Moorhouse, Shelia C. ([id number]) -------------------------------------------------------------------------------- Debridement Details Patient Name: [name], [name] Date of Service: 09/03/2018 12:30 PM Medical Record Number: [id number] Patient Account Number: [id number] Date of Birth/Sex: 06-Mar-1924 (85 y.o. F) Treating RN: Harold Barban Primary Care Provider: Glendon Axe Other Clinician: Referring Provider: Glendon Axe Treating Provider/Extender: STONE III, HOYT Weeks in Treatment: 4 Debridement Performed for Wound #1 Right,Anterior Lower Leg Assessment: Performed By: Physician STONE III, HOYT E., PA-C Debridement Type: Debridement Level of Consciousness (Pre- Awake and Alert procedure): Pre-procedure Verification/Time Yes - 12:46 Out Taken: Start Time: 12:46 Pain Control: Lidocaine Total Area Debrided (L x W): 0.6 (cm) x 0.5 (cm) = 0.3 (cm) Tissue and other material Non-Viable, Eschar, Subcutaneous debrided: Level: Skin/Subcutaneous Tissue Debridement Description: Excisional Instrument: Curette Bleeding: Minimum Hemostasis Achieved: Pressure End Time: 12:50 Procedural Pain: 0 Post Procedural Pain: 0 Response to Treatment: Procedure was tolerated well Level of Consciousness Awake and  Alert (Post-procedure): Post Debridement Measurements of Total Wound Length: (cm) 0.6 Width: (cm) 0.5 Depth: (cm) 0.1 Volume: (cm) 0.024 Character of Wound/Ulcer Post Debridement: Improved Post Procedure Diagnosis Same as Pre-procedure Electronic Signature(s) Signed: 09/03/2018 5:13:49 PM By: Harold Barban Signed: 09/03/2018 9:58:15 PM By: Worthy Keeler PA-C Entered By: Harold Barban on 09/03/2018 12:49:00 Vine, Sally-Anne C. ([id number]) -------------------------------------------------------------------------------- HPI Details Patient Name: [name], [name] Date of Service: 09/03/2018 12:30 PM Medical Record Number: [id number] Patient Account Number: [id number] Date of Birth/Sex: 1924/05/13 (85 y.o. F) Treating RN: Harold Barban Primary Care Provider: Glendon Axe Other Clinician: Referring Provider: Glendon Axe Treating Provider/Extender: Melburn Hake, HOYT Weeks in Treatment: 4 History of Present Illness HPI Description: 08/06/18 on evaluation today patient actually presents for initial evaluation concerning a right anterior lower extremity ulcer which has been present for 4-5 months. She is a very poor historian really does not know if she tripped or how in fact she fell and injured this area. Upon further questioning it appears she actually has dimension I will explain a scenario such as how to perform the dressing changes and then she will ask me questions such as how often it should be done when I very specifically pointed this out to her. I definitely think that there is a strong component of dementia here leading to her poor history. She does have a history of atrial fibrillation as will a stroke and is on blood thinners chronically for this. She also has stage IV chronic kidney disease. Patient's wound over the anterior lower extremity of the right leg actually show signs of being completely eschar covered upon my initial evaluation today. No fevers, chills,  nausea, or vomiting noted at this time. 08/20/18 on evaluation today patient's wound actually appears to be doing significantly better which is excellent news. She has been having her daughter change the dressing's which I think has worked out great for her. With that being said we had previously ordered home health although they never actually got started simply due to the fact that I was concerned  about the patient's dimension of her being able to actually perform the dressing changes appropriately of her own accord. Nonetheless with her daughter taking care of this for her I see no issues and they seem to be doing excellent at this point. 09/03/18 on evaluation today patient's wound actually appears to be doing much better which is good news. With that being said she did initially have a lot of dry drainage on the surface of the wound. This is subsequently due to the fact that on Friday patient's daughter [name] she will move the dressing to clean the area and then forgot to reapply the dressing before she left her mother's home. She subsequently called her father to get him to apply it but when she picked her mother up today to bring her to the appointment this still did not have a dressing on it. Nonetheless things do not appear to be doing too poorly which is good news overall I do feel like she's showing signs of improvement. Electronic Signature(s) Signed: 09/03/2018 9:58:15 PM By: Worthy Keeler PA-C Entered By: Worthy Keeler on 09/03/2018 17:09:38 Buice, Miana C. ([id number]) -------------------------------------------------------------------------------- Physical Exam Details Patient Name: [name], [name] Date of Service: 09/03/2018 12:30 PM Medical Record Number: [id number] Patient Account Number: [id number] Date of Birth/Sex: [birth year] (85 y.o. F) Treating RN: Harold Barban Primary Care Provider: Glendon Axe Other Clinician: Referring Provider: Glendon Axe Treating  Provider/Extender: STONE III, HOYT Weeks in Treatment: 4 Constitutional Well-nourished and well-hydrated in no acute distress. Respiratory normal breathing without difficulty. Psychiatric this patient is able to make decisions and demonstrates good insight into disease process. Alert and Oriented x 3. pleasant and cooperative. Notes Patient's wound currently did require some sharp debridement to remove the eschar from the surface of the wound which he tolerated without any complication or pain post debridement the wound bed appears to be doing significantly better which is great news. Overall this is doing excellent. Electronic Signature(s) Signed: 09/03/2018 9:58:15 PM By: Worthy Keeler PA-C Entered By: Worthy Keeler on 09/03/2018 17:10:06 Sadlowski, Deloma CMarland Kitchen ([id number]) -------------------------------------------------------------------------------- Physician Orders Details Patient Name: Preisler, Tashiya C. Date of Service: 09/03/2018 12:30 PM Medical Record Number: [id number] Patient Account Number: [id number] Date of Birth/Sex: 1924-05-25 (85 y.o. F) Treating RN: Harold Barban Primary Care Provider: Glendon Axe Other Clinician: Referring Provider: Glendon Axe Treating Provider/Extender: STONE III, HOYT Weeks in Treatment: 4 Verbal / Phone Orders: No Diagnosis Coding ICD-10 Coding Code Description S81.801A Unspecified open wound, right lower leg, initial encounter L97.818 Non-pressure chronic ulcer of other part of right lower leg with other specified severity N18.4 Chronic kidney disease, stage 4 (severe) I48.0 Paroxysmal atrial fibrillation Z79.01 Long term (current) use of anticoagulants Wound Cleansing o May Shower, gently pat wound dry prior to applying new dressing. - Clean wound with Dial Antibacterial soap ( gold soap ) pump bottle Primary Wound Dressing Wound #1 Right,Anterior Lower Leg o Xeroform Secondary Dressing Wound #1 Right,Anterior Lower  Leg o Conform/Kerlix o [place] Dressing Change Frequency Wound #1 Right,Anterior Lower Leg o Change dressing every other day. Follow-up Appointments Wound #1 Right,Anterior Lower Leg o Return Appointment in 1 week. Edema Control Wound #1 Right,Anterior Lower Leg o Other: - Tubi grip Electronic Signature(s) Signed: 09/03/2018 5:13:49 PM By: Harold Barban Signed: 09/03/2018 9:58:15 PM By: Worthy Keeler PA-C Entered By: Harold Barban on 09/03/2018 12:52:51 Fouche, Sunny C. ([id number]) -------------------------------------------------------------------------------- Problem List Details Patient Name: Raske, Willadeen C. Date of Service: 09/03/2018 12:30 PM Medical Record Number: [id number]  Patient Account Number: [id number] Date of Birth/Sex: 09-07-1923 (85 y.o. F) Treating RN: Harold Barban Primary Care Provider: Glendon Axe Other Clinician: Referring Provider: Glendon Axe Treating Provider/Extender: Melburn Hake, HOYT Weeks in Treatment: 4 Active Problems ICD-10 Evaluated Encounter Code Description Active Date Today Diagnosis S81.801A Unspecified open wound, right lower leg, initial encounter 08/06/2018 No Yes L97.818 Non-pressure chronic ulcer of other part of right lower leg 08/06/2018 No Yes with other specified severity N18.4 Chronic kidney disease, stage 4 (severe) 08/06/2018 No Yes I48.0 Paroxysmal atrial fibrillation 08/06/2018 No Yes Z79.01 Long term (current) use of anticoagulants 08/06/2018 No Yes Inactive Problems Resolved Problems Electronic Signature(s) Signed: 09/03/2018 9:58:15 PM By: Worthy Keeler PA-C Entered By: Worthy Keeler on 09/03/2018 12:42:59 Care, Lindsy C. ([id number]) -------------------------------------------------------------------------------- Progress Note Details Patient Name: Mcdowell, Santiaga C. Date of Service: 09/03/2018 12:30 PM Medical Record Number: [id number] Patient Account Number: [id number] Date of  Birth/Sex: 1924/05/28 (85 y.o. F) Treating RN: Harold Barban Primary Care Provider: Glendon Axe Other Clinician: Referring Provider: Glendon Axe Treating Provider/Extender: Melburn Hake, HOYT Weeks in Treatment: 4 Subjective Chief Complaint Information obtained from Patient Right anterior LE Ulcer History of Present Illness (HPI) 08/06/18 on evaluation today patient actually presents for initial evaluation concerning a right anterior lower extremity ulcer which has been present for 4-5 months. She is a very poor historian really does not know if she tripped or how in fact she fell and injured this area. Upon further questioning it appears she actually has dimension I will explain a scenario such as how to perform the dressing changes and then she will ask me questions such as how often it should be done when I very specifically pointed this out to her. I definitely think that there is a strong component of dementia here leading to her poor history. She does have a history of atrial fibrillation as will a stroke and is on blood thinners chronically for this. She also has stage IV chronic kidney disease. Patient's wound over the anterior lower extremity of the right leg actually show signs of being completely eschar covered upon my initial evaluation today. No fevers, chills, nausea, or vomiting noted at this time. 08/20/18 on evaluation today patient's wound actually appears to be doing significantly better which is excellent news. She has been having her daughter change the dressing's which I think has worked out great for her. With that being said we had previously ordered home health although they never actually got started simply due to the fact that I was concerned about the patient's dimension of her being able to actually perform the dressing changes appropriately of her own accord. Nonetheless with her daughter taking care of this for her I see no issues and they seem to be doing  excellent at this point. 09/03/18 on evaluation today patient's wound actually appears to be doing much better which is good news. With that being said she did initially have a lot of dry drainage on the surface of the wound. This is subsequently due to the fact that on Friday patient's daughter [name] she will move the dressing to clean the area and then forgot to reapply the dressing before she left her mother's home. She subsequently called her father to get him to apply it but when she picked her mother up today to bring her to the appointment this still did not have a dressing on it. Nonetheless things do not appear to be doing too poorly which is good news overall I do feel  like she's showing signs of improvement. Patient History Information obtained from Patient. Family History Heart Disease - Father, Hypertension - Father, Seizures - Father, No family history of Cancer, Diabetes, Hereditary Spherocytosis, Kidney Disease, Lung Disease, Stroke, Thyroid Problems, Tuberculosis. Social History Never smoker, Marital Status - Married, Alcohol Use - Never, Drug Use - No History, Caffeine Use - Daily. Medical And Surgical History Notes Cardiovascular pericardial effusion Neurologic CVA Review of Systems (ROS) Denison, Mei C. ([id number]) Constitutional Symptoms (General Health) Denies complaints or symptoms of Fever, Chills. Respiratory The patient has no complaints or symptoms. Cardiovascular The patient has no complaints or symptoms. Psychiatric The patient has no complaints or symptoms. Objective Constitutional Well-nourished and well-hydrated in no acute distress. Vitals Time Taken: 12:30 PM, Height: 65 in, Weight: 115 lbs, BMI: 19.1, Temperature: 97.7 F, Pulse: 67 bpm, Respiratory Rate: 16 breaths/min, Blood Pressure: 100/71 mmHg. Respiratory normal breathing without difficulty. Psychiatric this patient is able to make decisions and demonstrates good insight into disease  process. Alert and Oriented x 3. pleasant and cooperative. General Notes: Patient's wound currently did require some sharp debridement to remove the eschar from the surface of the wound which he tolerated without any complication or pain post debridement the wound bed appears to be doing significantly better which is great news. Overall this is doing excellent. Integumentary (Hair, Skin) Wound #1 status is Open. Original cause of wound was Trauma. The wound is located on the Right,Anterior Lower Leg. The wound measures 0.6cm length x 0.5cm width x 0.1cm depth; 0.236cm^2 area and 0.024cm^3 volume. There is Fat Layer (Subcutaneous Tissue) Exposed exposed. There is no tunneling or undermining noted. There is a none present amount of drainage noted. The wound margin is flat and intact. There is no granulation within the wound bed. There is a large (67-100%) amount of necrotic tissue within the wound bed including Eschar. The periwound skin appearance exhibited: Scarring, Ecchymosis. The periwound skin appearance did not exhibit: Callus, Crepitus, Excoriation, Induration, Rash, Dry/Scaly, Maceration, Atrophie Blanche, Cyanosis, Hemosiderin Staining, Mottled, Pallor, Rubor, Erythema. Periwound temperature was noted as No Abnormality. The periwound has tenderness on palpation. Assessment Active Problems ICD-10 Unspecified open wound, right lower leg, initial encounter Non-pressure chronic ulcer of other part of right lower leg with other specified severity Macari, Jessicamarie C. ([id number]) Chronic kidney disease, stage 4 (severe) Paroxysmal atrial fibrillation Long term (current) use of anticoagulants Procedures Wound #1 Pre-procedure diagnosis of Wound #1 is a Trauma, Other located on the Right,Anterior Lower Leg . There was a Excisional Skin/Subcutaneous Tissue Debridement with a total area of 0.3 sq cm performed by STONE III, HOYT E., PA-C. With the following instrument(s): Curette to remove  Non-Viable tissue/material. Material removed includes Eschar and Subcutaneous Tissue and after achieving pain control using Lidocaine. No specimens were taken. A time out was conducted at 12:46, prior to the start of the procedure. A Minimum amount of bleeding was controlled with Pressure. The procedure was tolerated well with a pain level of 0 throughout and a pain level of 0 following the procedure. Post Debridement Measurements: 0.6cm length x 0.5cm width x 0.1cm depth; 0.024cm^3 volume. Character of Wound/Ulcer Post Debridement is improved. Post procedure Diagnosis Wound #1: Same as Pre-Procedure Plan Wound Cleansing: May Shower, gently pat wound dry prior to applying new dressing. - Clean wound with Dial Antibacterial soap ( gold soap ) pump bottle Primary Wound Dressing: Wound #1 Right,Anterior Lower Leg: Xeroform Secondary Dressing: Wound #1 Right,Anterior Lower Leg: Conform/Kerlix [place] Dressing Change Frequency: Wound #1  Right,Anterior Lower Leg: Change dressing every other day. Follow-up Appointments: Wound #1 Right,Anterior Lower Leg: Return Appointment in 1 week. Edema Control: Wound #1 Right,Anterior Lower Leg: Other: - Tubi grip My suggestion at this point is gonna be that we go ahead and continue with the above wound care measures and she seems to be doing so well. Patient is in agreement with plan as is her daughter and she states she will continue to change the dressing that she has been doing. We will see were things stand at follow-up. Please see above for specific wound care orders. We will see patient for re-evaluation in 1 week(s) here in the clinic. If anything worsens or changes patient will contact our office for additional recommendations. Sida, Danila C. ([id number]) Electronic Signature(s) Signed: 09/03/2018 9:58:15 PM By: Worthy Keeler PA-C Entered By: Worthy Keeler on 09/03/2018 17:10:26 Davidson, Shakedra C.  ([id number]) -------------------------------------------------------------------------------- ROS/PFSH Details Patient Name: Woolen, Hermina C. Date of Service: 09/03/2018 12:30 PM Medical Record Number: [id number] Patient Account Number: [id number] Date of Birth/Sex: February 22, 1924 (85 y.o. F) Treating RN: Harold Barban Primary Care Provider: Glendon Axe Other Clinician: Referring Provider: Glendon Axe Treating Provider/Extender: STONE III, HOYT Weeks in Treatment: 4 Information Obtained From Patient Wound History Do you currently have one or more open woundso Yes How many open wounds do you currently haveo 1 Approximately how long have you had your woundso 4 or 5 months How have you been treating your wound(s) until nowo aloe Has your wound(s) ever healed and then re-openedo No Have you had any lab work done in the past montho No Have you tested positive for an antibiotic resistant organism (MRSA, VRE)o No Have you tested positive for osteomyelitis (bone infection)o No Have you had any tests for circulation on your legso No Constitutional Symptoms (General Health) Complaints and Symptoms: Negative for: Fever; Chills Eyes Medical History: Negative for: Cataracts; Glaucoma; Optic Neuritis Ear/Nose/Mouth/Throat Medical History: Negative for: Chronic sinus problems/congestion; Middle ear problems Hematologic/Lymphatic Medical History: Negative for: Anemia; Hemophilia; Human Immunodeficiency Virus; Lymphedema; Sickle Cell Disease Respiratory Complaints and Symptoms: No Complaints or Symptoms Medical History: Negative for: Aspiration; Asthma; Chronic Obstructive Pulmonary Disease (COPD); Pneumothorax; Sleep Apnea; Tuberculosis Cardiovascular Complaints and Symptoms: No Complaints or Symptoms Medical History: Positive for: Arrhythmia - a fib; Congestive Heart Failure Trentman, Rethel C. ([id number]) Negative for: Angina; Coronary Artery Disease; Deep Vein Thrombosis;  Hypertension; Hypotension; Myocardial Infarction; Peripheral Arterial Disease; Peripheral Venous Disease; Phlebitis; Vasculitis Past Medical History Notes: pericardial effusion Gastrointestinal Medical History: Negative for: Cirrhosis ; Colitis; Crohnos; Hepatitis A; Hepatitis B; Hepatitis C Endocrine Medical History: Negative for: Type I Diabetes; Type II Diabetes Genitourinary Medical History: Positive for: End Stage Renal Disease - CKD stage 4 Immunological Medical History: Negative for: Lupus Erythematosus; Raynaudos; Scleroderma Integumentary (Skin) Medical History: Negative for: History of Burn; History of pressure wounds Musculoskeletal Medical History: Negative for: Gout; Rheumatoid Arthritis; Osteoarthritis; Osteomyelitis Neurologic Medical History: Negative for: Dementia; Neuropathy; Quadriplegia; Paraplegia; Seizure Disorder Past Medical History Notes: CVA Oncologic Medical History: Negative for: Received Chemotherapy; Received Radiation Psychiatric Complaints and Symptoms: No Complaints or Symptoms Medical History: Negative for: Anorexia/bulimia; Confinement Anxiety Immunizations Pneumococcal Vaccine: Received Pneumococcal Vaccination: Yes Immunization Notes: Ambler, Janiyla C. ([id number]) up to date Implantable Devices Family and Social History Cancer: No; Diabetes: No; Heart Disease: Yes - Father; Hereditary Spherocytosis: No; Hypertension: Yes - Father; Kidney Disease: No; Lung Disease: No; Seizures: Yes - Father; Stroke: No; Thyroid Problems: No; Tuberculosis: No; Never smoker; Marital Status - Married; Alcohol Use:  Never; Drug Use: No History; Caffeine Use: Daily; Financial Concerns: No; Food, Clothing or Shelter Needs: No; Support System Lacking: No; Transportation Concerns: No; Advanced Directives: Yes (Not Provided); Patient does not want information on Advanced Directives; Living Will: Yes (Not Provided) Physician Affirmation I have reviewed and  agree with the above information. Electronic Signature(s) Signed: 09/03/2018 5:13:49 PM By: Harold Barban Signed: 09/03/2018 9:58:15 PM By: Worthy Keeler PA-C Entered By: Worthy Keeler on 09/03/2018 17:09:52 [place], Tatia C. ([id number]) -------------------------------------------------------------------------------- SuperBill Details Patient Name: Snodgrass, Greysen C. Date of Service: 09/03/2018 Medical Record Number: [id number] Patient Account Number: [id number] Date of Birth/Sex: 1924/06/15 (85 y.o. F) Treating RN: Harold Barban Primary Care Provider: Glendon Axe Other Clinician: Referring Provider: Glendon Axe Treating Provider/Extender: STONE III, HOYT Weeks in Treatment: 4 Diagnosis Coding ICD-10 Codes Code Description S81.801A Unspecified open wound, right lower leg, initial encounter L97.818 Non-pressure chronic ulcer of other part of right lower leg with other specified severity N18.4 Chronic kidney disease, stage 4 (severe) I48.0 Paroxysmal atrial fibrillation Z79.01 Long term (current) use of anticoagulants Facility Procedures CPT4 Code Description: 32440102 11042 - DEB SUBQ TISSUE 20 SQ CM/< ICD-10 Diagnosis Description L97.818 Non-pressure chronic ulcer of other part of right lower leg with Modifier: other specified Quantity: 1 severity Physician Procedures CPT4 Code Description: [id number] 11042 - WC PHYS SUBQ TISS 20 SQ CM ICD-10 Diagnosis Description L97.818 Non-pressure chronic ulcer of other part of right lower leg with Modifier: other specified Quantity: 1 severity Electronic Signature(s) Signed: 09/03/2018 9:58:15 PM By: Worthy Keeler PA-C Entered By: Worthy Keeler on 09/03/2018 17:10:33

## 2018-09-10 ENCOUNTER — Encounter: Payer: Medicare Other | Admitting: Physician Assistant

## 2018-09-10 DIAGNOSIS — S81801A Unspecified open wound, right lower leg, initial encounter: Secondary | ICD-10-CM | POA: Diagnosis not present

## 2018-09-16 NOTE — Progress Notes (Signed)
[name], [name] ([id number]) Visit Report for 09/10/2018 Chief Complaint Document Details Patient Name: [name], [name] Date of Service: 09/10/2018 12:30 PM Medical Record Number: [id number] Patient Account Number: [id number] Date of Birth/Sex: [birth year] (85 y.o. F) Treating RN: Cornell Barman Primary Care Provider: Glendon Axe Other Clinician: Referring Provider: Glendon Axe Treating Provider/Extender: Melburn Hake, HOYT Weeks in Treatment: 5 Information Obtained from: Patient Chief Complaint Right anterior LE Ulcer Electronic Signature(s) Signed: 09/14/2018 9:05:33 PM By: Worthy Keeler PA-C Entered By: Worthy Keeler on 09/10/2018 12:49:28 [name], [name] ([id number]) -------------------------------------------------------------------------------- HPI Details Patient Name: [name], [name] Date of Service: 09/10/2018 12:30 PM Medical Record Number: [id number] Patient Account Number: [id number] Date of Birth/Sex: 1923-09-11 (85 y.o. F) Treating RN: Cornell Barman Primary Care Provider: Glendon Axe Other Clinician: Referring Provider: Glendon Axe Treating Provider/Extender: Melburn Hake, HOYT Weeks in Treatment: 5 History of Present Illness HPI Description: 08/06/18 on evaluation today patient actually presents for initial evaluation concerning a right anterior lower extremity ulcer which has been present for 4-5 months. She is a very poor historian really does not know if she tripped or how in fact she fell and injured this area. Upon further questioning it appears she actually has dimension I will explain a scenario such as how to perform the dressing changes and then she will ask me questions such as how often it should be done when I very specifically pointed this out to her. I definitely think that there is a strong component of dementia here leading to her poor history. She does have a history of atrial fibrillation as will a stroke and is on blood thinners  chronically for this. She also has stage IV chronic kidney disease. Patient's wound over the anterior lower extremity of the right leg actually show signs of being completely eschar covered upon my initial evaluation today. No fevers, chills, nausea, or vomiting noted at this time. 08/20/18 on evaluation today patient's wound actually appears to be doing significantly better which is excellent news. She has been having her daughter change the dressing's which I think has worked out great for her. With that being said we had previously ordered home health although they never actually got started simply due to the fact that I was concerned about the patient's dimension of her being able to actually perform the dressing changes appropriately of her own accord. Nonetheless with her daughter taking care of this for her I see no issues and they seem to be doing excellent at this point. 09/03/18 on evaluation today patient's wound actually appears to be doing much better which is good news. With that being said she did initially have a lot of dry drainage on the surface of the wound. This is subsequently due to the fact that on Friday patient's daughter Cristina [name] she will move the dressing to clean the area and then forgot to reapply the dressing before she left her mother's home. She subsequently called her father to get him to apply it but when she picked her mother up today to bring her to the appointment this still did not have a dressing on it. Nonetheless things do not appear to be doing too poorly which is good news overall I do feel like she's showing signs of improvement. 09/10/18 on evaluation today patient's wound actually appears to be completely healed which is excellent news. Overall she's been tolerating the dressing changes without complication. Electronic Signature(s) Signed: 09/14/2018 9:05:33 PM By: Worthy Keeler PA-C Entered By: Worthy Keeler  on 09/10/2018 13:21:51 [name], [name]  ([id number]) -------------------------------------------------------------------------------- Physical Exam Details Patient Name: [name], [name] Date of Service: 09/10/2018 12:30 PM Medical Record Number: [id number] Patient Account Number: [id number] Date of Birth/Sex: 1924/04/17 (85 y.o. F) Treating RN: Cornell Barman Primary Care Provider: Glendon Axe Other Clinician: Referring Provider: Glendon Axe Treating Provider/Extender: STONE III, HOYT Weeks in Treatment: 5 Constitutional Well-nourished and well-hydrated in no acute distress. Respiratory normal breathing without difficulty. clear to auscultation bilaterally. Cardiovascular regular rate and rhythm with normal S1, S2. Psychiatric this patient is able to make decisions and demonstrates good insight into disease process. Alert and Oriented x 3. pleasant and cooperative. Notes Upon inspection today patient's wound bed again shows signs of complete epithelialization is a little bit of dry skin on the surface although I did not attempt to remove this simply due to the fact I did not want to cause any destruction to the new epithelium. Subsequently I think she has done very well and I think that this is likely healed but I would just like her to get this time of her own accord and let it fall off and not actually try to force the issue. Electronic Signature(s) Signed: 09/14/2018 9:05:33 PM By: Worthy Keeler PA-C Entered By: Worthy Keeler on 09/10/2018 13:24:18 [name], Cristina CMarland Kitchen ([id number]) -------------------------------------------------------------------------------- Physician Orders Details Patient Name: [name], [name] Date of Service: 09/10/2018 12:30 PM Medical Record Number: [id number] Patient Account Number: [id number] Date of Birth/Sex: 10-Feb-1924 (85 y.o. F) Treating RN: Cornell Barman Primary Care Provider: Glendon Axe Other Clinician: Referring Provider: Glendon Axe Treating Provider/Extender: STONE III,  HOYT Weeks in Treatment: 5 Verbal / Phone Orders: No Diagnosis Coding ICD-10 Coding Code Description S81.801A Unspecified open wound, right lower leg, initial encounter L97.818 Non-pressure chronic ulcer of other part of right lower leg with other specified severity N18.4 Chronic kidney disease, stage 4 (severe) I48.0 Paroxysmal atrial fibrillation Z79.01 Long term (current) use of anticoagulants Primary Wound Dressing o Xeroform Secondary Dressing o Other - coverlet to protect over the next 2 weeks Discharge From [facility] Services o Discharge from [place] Signature(s) Signed: 09/10/2018 4:45:46 PM By: Gretta Cool, BSN, RN, CWS, Kim RN, BSN Signed: 09/14/2018 9:05:33 PM By: Worthy Keeler PA-C Entered By: Gretta Cool, BSN, RN, CWS, Kim on 09/10/2018 12:53:01 [name], [name] ([id number]) -------------------------------------------------------------------------------- Problem List Details Patient Name: [name], [name] Date of Service: 09/10/2018 12:30 PM Medical Record Number: [id number] Patient Account Number: [id number] Date of Birth/Sex: April 30, 1924 (85 y.o. F) Treating RN: Cornell Barman Primary Care Provider: Glendon Axe Other Clinician: Referring Provider: Glendon Axe Treating Provider/Extender: Melburn Hake, HOYT Weeks in Treatment: 5 Active Problems ICD-10 Evaluated Encounter Code Description Active Date Today Diagnosis S81.801A Unspecified open wound, right lower leg, initial encounter 08/06/2018 No Yes L97.818 Non-pressure chronic ulcer of other part of right lower leg 08/06/2018 No Yes with other specified severity N18.4 Chronic kidney disease, stage 4 (severe) 08/06/2018 No Yes I48.0 Paroxysmal atrial fibrillation 08/06/2018 No Yes Z79.01 Long term (current) use of anticoagulants 08/06/2018 No Yes Inactive Problems Resolved Problems Electronic Signature(s) Signed: 09/14/2018 9:05:33 PM By: Worthy Keeler PA-C Entered By: Worthy Keeler on  09/10/2018 12:49:18 [name], [name] ([id number]) -------------------------------------------------------------------------------- Progress Note Details Patient Name: [name], [name] Date of Service: 09/10/2018 12:30 PM Medical Record Number: [id number] Patient Account Number: [id number] Date of Birth/Sex: [birth year] (85 y.o. F) Treating RN: Cornell Barman Primary Care Provider: Glendon Axe Other Clinician: Referring Provider: Glendon Axe Treating Provider/Extender: STONE III, HOYT Weeks in  Treatment: 5 Subjective Chief Complaint Information obtained from Patient Right anterior LE Ulcer History of Present Illness (HPI) 08/06/18 on evaluation today patient actually presents for initial evaluation concerning a right anterior lower extremity ulcer which has been present for 4-5 months. She is a very poor historian really does not know if she tripped or how in fact she fell and injured this area. Upon further questioning it appears she actually has dimension I will explain a scenario such as how to perform the dressing changes and then she will ask me questions such as how often it should be done when I very specifically pointed this out to her. I definitely think that there is a strong component of dementia here leading to her poor history. She does have a history of atrial fibrillation as will a stroke and is on blood thinners chronically for this. She also has stage IV chronic kidney disease. Patient's wound over the anterior lower extremity of the right leg actually show signs of being completely eschar covered upon my initial evaluation today. No fevers, chills, nausea, or vomiting noted at this time. 08/20/18 on evaluation today patient's wound actually appears to be doing significantly better which is excellent news. She has been having her daughter change the dressing's which I think has worked out great for her. With that being said we had previously ordered home health although  they never actually got started simply due to the fact that I was concerned about the patient's dimension of her being able to actually perform the dressing changes appropriately of her own accord. Nonetheless with her daughter taking care of this for her I see no issues and they seem to be doing excellent at this point. 09/03/18 on evaluation today patient's wound actually appears to be doing much better which is good news. With that being said she did initially have a lot of dry drainage on the surface of the wound. This is subsequently due to the fact that on Friday patient's daughter Cristina [name] she will move the dressing to clean the area and then forgot to reapply the dressing before she left her mother's home. She subsequently called her father to get him to apply it but when she picked her mother up today to bring her to the appointment this still did not have a dressing on it. Nonetheless things do not appear to be doing too poorly which is good news overall I do feel like she's showing signs of improvement. 09/10/18 on evaluation today patient's wound actually appears to be completely healed which is excellent news. Overall she's been tolerating the dressing changes without complication. Patient History Information obtained from Patient. Family History Heart Disease - Father, Hypertension - Father, Seizures - Father, No family history of Cancer, Diabetes, Hereditary Spherocytosis, Kidney Disease, Lung Disease, Stroke, Thyroid Problems, Tuberculosis. Social History Never smoker, Marital Status - Married, Alcohol Use - Never, Drug Use - No History, Caffeine Use - Daily. Medical History Eyes Denies history of Cataracts, Glaucoma, Optic Neuritis Ear/Nose/Mouth/Throat Bauers, Caisley C. ([id number]) Denies history of Chronic sinus problems/congestion, Middle ear problems Hematologic/Lymphatic Denies history of Anemia, Hemophilia, Human Immunodeficiency Virus, Lymphedema, Sickle Cell  Disease Respiratory Denies history of Aspiration, Asthma, Chronic Obstructive Pulmonary Disease (COPD), Pneumothorax, Sleep Apnea, Tuberculosis Cardiovascular Patient has history of Arrhythmia - a fib, Congestive Heart Failure Denies history of Angina, Coronary Artery Disease, Deep Vein Thrombosis, Hypertension, Hypotension, Myocardial Infarction, Peripheral Arterial Disease, Peripheral Venous Disease, Phlebitis, Vasculitis Gastrointestinal Denies history of Cirrhosis , Colitis, Crohn s, Hepatitis  A, Hepatitis B, Hepatitis C Endocrine Denies history of Type I Diabetes, Type II Diabetes Genitourinary Patient has history of End Stage Renal Disease - CKD stage 4 Immunological Denies history of Lupus Erythematosus, Raynaud s, Scleroderma Integumentary (Skin) Denies history of History of Burn, History of pressure wounds Musculoskeletal Denies history of Gout, Rheumatoid Arthritis, Osteoarthritis, Osteomyelitis Neurologic Denies history of Dementia, Neuropathy, Quadriplegia, Paraplegia, Seizure Disorder Oncologic Denies history of Received Chemotherapy, Received Radiation Psychiatric Denies history of Anorexia/bulimia, Confinement Anxiety Medical And Surgical History Notes Cardiovascular pericardial effusion Neurologic CVA Review of Systems (ROS) Constitutional Symptoms (General Health) Denies complaints or symptoms of Fever, Chills. Respiratory The patient has no complaints or symptoms. Cardiovascular The patient has no complaints or symptoms. Psychiatric The patient has no complaints or symptoms. Objective Constitutional Well-nourished and well-hydrated in no acute distress. Vitals Time Taken: 12:34 PM, Height: 65 in, Weight: 115 lbs, BMI: 19.1, Temperature: 97.7 F, Pulse: 65 bpm, Respiratory [name], [name] ([id number]) Rate: 16 breaths/min, Blood Pressure: 114/52 mmHg. Respiratory normal breathing without difficulty. clear to auscultation  bilaterally. Cardiovascular regular rate and rhythm with normal S1, S2. Psychiatric this patient is able to make decisions and demonstrates good insight into disease process. Alert and Oriented x 3. pleasant and cooperative. General Notes: Upon inspection today patient's wound bed again shows signs of complete epithelialization is a little bit of dry skin on the surface although I did not attempt to remove this simply due to the fact I did not want to cause any destruction to the new epithelium. Subsequently I think she has done very well and I think that this is likely healed but I would just like her to get this time of her own accord and let it fall off and not actually try to force the issue. Integumentary (Hair, Skin) Wound #1 status is Healed - Epithelialized. Original cause of wound was Trauma. The wound is located on the Right,Anterior Lower Leg. The wound measures 0cm length x 0cm width x 0cm depth; 0cm^2 area and 0cm^3 volume. There is Fat Layer (Subcutaneous Tissue) Exposed exposed. There is no tunneling or undermining noted. There is a none present amount of drainage noted. The wound margin is flat and intact. There is no granulation within the wound bed. There is a large (67-100%) amount of necrotic tissue within the wound bed including Eschar. The periwound skin appearance exhibited: Scarring, Ecchymosis. The periwound skin appearance did not exhibit: Callus, Crepitus, Excoriation, Induration, Rash, Dry/Scaly, Maceration, Atrophie Blanche, Cyanosis, Hemosiderin Staining, Mottled, Pallor, Rubor, Erythema. Periwound temperature was noted as No Abnormality. The periwound has tenderness on palpation. Assessment Active Problems ICD-10 Unspecified open wound, right lower leg, initial encounter Non-pressure chronic ulcer of other part of right lower leg with other specified severity Chronic kidney disease, stage 4 (severe) Paroxysmal atrial fibrillation Long term (current) use of  anticoagulants Plan Primary Wound Dressing: Xeroform Secondary Dressing: Other - coverlet to protect over the next 2 weeks Discharge From [facility] Services: Discharge from [place]. ([id number]) Cephus Slater continue with using the Xeroform for one more week and then will subsequently see were things stand if we need to in the future. If anything changes you let me know otherwise we will discontinue wound care services at this point. Electronic Signature(s) Signed: 09/14/2018 9:05:33 PM By: Worthy Keeler PA-C Entered By: Worthy Keeler on 09/10/2018 13:24:33 Guinn, [name] ([id number]) -------------------------------------------------------------------------------- ROS/PFSH Details Patient Name: Arno, Mikaila C. Date of Service: 09/10/2018 12:30 PM Medical Record Number: [id number]  Patient Account Number: [id number] Date of Birth/Sex: 1923-08-23 (85 y.o. F) Treating RN: Cornell Barman Primary Care Provider: Glendon Axe Other Clinician: Referring Provider: Glendon Axe Treating Provider/Extender: STONE III, HOYT Weeks in Treatment: 5 Information Obtained From Patient Wound History Do you currently have one or more open woundso Yes How many open wounds do you currently haveo 1 Approximately how long have you had your woundso 4 or 5 months How have you been treating your wound(s) until nowo aloe Has your wound(s) ever healed and then re-openedo No Have you had any lab work done in the past montho No Have you tested positive for an antibiotic resistant organism (MRSA, VRE)o No Have you tested positive for osteomyelitis (bone infection)o No Have you had any tests for circulation on your legso No Constitutional Symptoms (General Health) Complaints and Symptoms: Negative for: Fever; Chills Eyes Medical History: Negative for: Cataracts; Glaucoma; Optic Neuritis Ear/Nose/Mouth/Throat Medical History: Negative for: Chronic sinus problems/congestion;  Middle ear problems Hematologic/Lymphatic Medical History: Negative for: Anemia; Hemophilia; Human Immunodeficiency Virus; Lymphedema; Sickle Cell Disease Respiratory Complaints and Symptoms: No Complaints or Symptoms Medical History: Negative for: Aspiration; Asthma; Chronic Obstructive Pulmonary Disease (COPD); Pneumothorax; Sleep Apnea; Tuberculosis Cardiovascular Complaints and Symptoms: No Complaints or Symptoms Medical History: Positive for: Arrhythmia - a fib; Congestive Heart Failure [name], [name] ([id number]) Negative for: Angina; Coronary Artery Disease; Deep Vein Thrombosis; Hypertension; Hypotension; Myocardial Infarction; Peripheral Arterial Disease; Peripheral Venous Disease; Phlebitis; Vasculitis Past Medical History Notes: pericardial effusion Gastrointestinal Medical History: Negative for: Cirrhosis ; Colitis; Crohnos; Hepatitis A; Hepatitis B; Hepatitis C Endocrine Medical History: Negative for: Type I Diabetes; Type II Diabetes Genitourinary Medical History: Positive for: End Stage Renal Disease - CKD stage 4 Immunological Medical History: Negative for: Lupus Erythematosus; Raynaudos; Scleroderma Integumentary (Skin) Medical History: Negative for: History of Burn; History of pressure wounds Musculoskeletal Medical History: Negative for: Gout; Rheumatoid Arthritis; Osteoarthritis; Osteomyelitis Neurologic Medical History: Negative for: Dementia; Neuropathy; Quadriplegia; Paraplegia; Seizure Disorder Past Medical History Notes: CVA Oncologic Medical History: Negative for: Received Chemotherapy; Received Radiation Psychiatric Complaints and Symptoms: No Complaints or Symptoms Medical History: Negative for: Anorexia/bulimia; Confinement Anxiety Immunizations Pneumococcal Vaccine: Received Pneumococcal Vaccination: Yes Immunization Notes: Mongillo, Mikell C. ([id number]) up to date Implantable Devices Family and Social History Cancer: No;  Diabetes: No; Heart Disease: Yes - Father; Hereditary Spherocytosis: No; Hypertension: Yes - Father; Kidney Disease: No; Lung Disease: No; Seizures: Yes - Father; Stroke: No; Thyroid Problems: No; Tuberculosis: No; Never smoker; Marital Status - Married; Alcohol Use: Never; Drug Use: No History; Caffeine Use: Daily; Financial Concerns: No; Food, Clothing or Shelter Needs: No; Support System Lacking: No; Transportation Concerns: No; Advanced Directives: Yes (Not Provided); Patient does not want information on Advanced Directives; Living Will: Yes (Not Provided) Physician Affirmation I have reviewed and agree with the above information. Electronic Signature(s) Signed: 09/10/2018 4:45:46 PM By: Gretta Cool, BSN, RN, CWS, Kim RN, BSN Signed: 09/14/2018 9:05:33 PM By: Worthy Keeler PA-C Entered By: Worthy Keeler on 09/10/2018 13:22:38 Schryver, Ladavia C. ([id number]) -------------------------------------------------------------------------------- SuperBill Details Patient Name: Koziol, Selena C. Date of Service: 09/10/2018 Medical Record Number: [id number] Patient Account Number: [id number] Date of Birth/Sex: 1923/11/10 (85 y.o. F) Treating RN: Cornell Barman Primary Care Provider: Glendon Axe Other Clinician: Referring Provider: Glendon Axe Treating Provider/Extender: Melburn Hake, HOYT Weeks in Treatment: 5 Diagnosis Coding ICD-10 Codes Code Description S81.801A Unspecified open wound, right lower leg, initial encounter L97.818 Non-pressure chronic ulcer of other part of right lower leg with other specified severity N18.4 Chronic  kidney disease, stage 4 (severe) I48.0 Paroxysmal atrial fibrillation Z79.01 Long term (current) use of anticoagulants Facility Procedures CPT4 Code: [id number] Description: [phone] - WOUND CARE VISIT-LEV 2 EST PT Modifier: Quantity: 1 Physician Procedures CPT4 Code Description: [id number] - WC PHYS LEVEL 2 - EST PT ICD-10 Diagnosis Description S81.801A Unspecified  open wound, right lower leg, initial encounter L97.818 Non-pressure chronic ulcer of other part of right lower leg wit N18.4 Chronic kidney  disease, stage 4 (severe) I48.0 Paroxysmal atrial fibrillation Modifier: h other specified Quantity: 1 severity Electronic Signature(s) Signed: 09/14/2018 9:05:33 PM By: Worthy Keeler PA-C Entered By: Worthy Keeler on 09/10/2018 13:25:41

## 2018-09-16 NOTE — Progress Notes (Signed)
Nemitz, Savon C. ([id number]) Visit Report for 09/10/2018 Arrival Information Details Patient Name: Cristina [name], [name] Date of Service: 09/10/2018 12:30 PM Medical Record Number: [id number] Patient Account Number: [id number] Date of Birth/Sex: 1924/06/19 (85 y.o. F) Treating RN: Secundino Ginger Primary Care Aydrien Froman: Glendon Axe Other Clinician: Referring Jahayra Mazo: Glendon Axe Treating Jedd Schulenburg/Extender: STONE III, HOYT Weeks in Treatment: 5 Visit Information History Since Last Visit Added or deleted any medications: No Patient Arrived: Ambulatory Any new allergies or adverse reactions: No Arrival Time: 12:28 Had a fall or experienced change in No Accompanied By: daughter activities of daily living that may affect Transfer Assistance: None risk of falls: Patient Identification Verified: Yes Signs or symptoms of abuse/neglect since last visito No Secondary Verification Process Yes Hospitalized since last visit: No Completed: Implantable device outside of the clinic excluding No Patient Has Alerts: Yes cellular tissue based products placed in the center Patient Alerts: Patient on Blood since last visit: Thinner Has Dressing in Place as Prescribed: Yes warfarin Pain Present Now: No Electronic Signature(s) Signed: 09/10/2018 2:52:40 PM By: Secundino Ginger Entered By: Secundino Ginger on 09/10/2018 12:34:15 Cristina [name], [name] ([id number]) -------------------------------------------------------------------------------- Clinic Level of Care Assessment Details Patient Name: [name], [name] Date of Service: 09/10/2018 12:30 PM Medical Record Number: [id number] Patient Account Number: [id number] Date of Birth/Sex: 02/07/1924 (85 y.o. F) Treating RN: Cornell Barman Primary Care Teela Narducci: Glendon Axe Other Clinician: Referring Tailey Top: Glendon Axe Treating Larry Knipp/Extender: STONE III, HOYT Weeks in Treatment: 5 Clinic Level of Care Assessment Items TOOL 4 Quantity Score []  - Use when  only an EandM is performed on FOLLOW-UP visit 0 ASSESSMENTS - Nursing Assessment / Reassessment []  - Reassessment of Co-morbidities (includes updates in patient status) 0 X- 1 5 Reassessment of Adherence to Treatment Plan ASSESSMENTS - Wound and Skin Assessment / Reassessment X - Simple Wound Assessment / Reassessment - one wound 1 5 []  - 0 Complex Wound Assessment / Reassessment - multiple wounds []  - 0 Dermatologic / Skin Assessment (not related to wound area) ASSESSMENTS - Focused Assessment []  - Circumferential Edema Measurements - multi extremities 0 []  - 0 Nutritional Assessment / Counseling / Intervention []  - 0 Lower Extremity Assessment (monofilament, tuning fork, pulses) []  - 0 Peripheral Arterial Disease Assessment (using hand held doppler) ASSESSMENTS - Ostomy and/or Continence Assessment and Care []  - Incontinence Assessment and Management 0 []  - 0 Ostomy Care Assessment and Management (repouching, etc.) PROCESS - Coordination of Care X - Simple Patient / Family Education for ongoing care 1 15 []  - 0 Complex (extensive) Patient / Family Education for ongoing care []  - 0 Staff obtains Programmer, systems, Records, Test Results / Process Orders []  - 0 Staff telephones HHA, Nursing Homes / Clarify orders / etc []  - 0 Routine Transfer to another Facility (non-emergent condition) []  - 0 Routine Hospital Admission (non-emergent condition) []  - 0 New Admissions / Biomedical engineer / Ordering NPWT, Apligraf, etc. []  - 0 Emergency Hospital Admission (emergent condition) X- 1 10 Simple Discharge Coordination Gunnarson, Denim C. ([id number]) []  - 0 Complex (extensive) Discharge Coordination PROCESS - Special Needs []  - Pediatric / Minor Patient Management 0 []  - 0 Isolation Patient Management []  - 0 Hearing / Language / Visual special needs []  - 0 Assessment of Community assistance (transportation, D/C planning, etc.) []  - 0 Additional assistance / Altered  mentation []  - 0 Support Surface(s) Assessment (bed, cushion, seat, etc.) INTERVENTIONS - Wound Cleansing / Measurement X - Simple Wound Cleansing - one wound 1 5 []  - 0 Complex  Wound Cleansing - multiple wounds X- 1 5 Wound Imaging (photographs - any number of wounds) []  - 0 Wound Tracing (instead of photographs) X- 1 5 Simple Wound Measurement - one wound []  - 0 Complex Wound Measurement - multiple wounds INTERVENTIONS - Wound Dressings []  - Small Wound Dressing one or multiple wounds 0 []  - 0 Medium Wound Dressing one or multiple wounds []  - 0 Large Wound Dressing one or multiple wounds []  - 0 Application of Medications - topical []  - 0 Application of Medications - injection INTERVENTIONS - Miscellaneous []  - External ear exam 0 []  - 0 Specimen Collection (cultures, biopsies, blood, body fluids, etc.) []  - 0 Specimen(s) / Culture(s) sent or taken to Lab for analysis []  - 0 Patient Transfer (multiple staff / Civil Service fast streamer / Similar devices) []  - 0 Simple Staple / Suture removal (25 or less) []  - 0 Complex Staple / Suture removal (26 or more) []  - 0 Hypo / Hyperglycemic Management (close monitor of Blood Glucose) []  - 0 Ankle / Brachial Index (ABI) - do not check if billed separately X- 1 5 Vital Signs Cristina [name], [name] ([id number]) Has the patient been seen at the hospital within the last three years: Yes Total Score: 55 Level Of Care: New/Established - Level 2 Electronic Signature(s) Signed: 09/10/2018 4:45:46 PM By: Gretta Cool, BSN, RN, CWS, Kim RN, BSN Entered By: Gretta Cool, BSN, RN, CWS, Kim on 09/10/2018 12:53:22 Cristina [name], [name] ([id number]) -------------------------------------------------------------------------------- Encounter Discharge Information Details Patient Name: [name], [name] Date of Service: 09/10/2018 12:30 PM Medical Record Number: [id number] Patient Account Number: [id number] Date of Birth/Sex: 02-18-1924 (85 y.o. F) Treating RN: Cornell Barman Primary Care Yu Cragun: Glendon Axe Other Clinician: Referring Tinea Nobile: Glendon Axe Treating Abdoulaye Drum/Extender: Melburn Hake, HOYT Weeks in Treatment: 5 Encounter Discharge Information Items Discharge Condition: Stable Ambulatory Status: Ambulatory Discharge Destination: Home Transportation: Private Auto Accompanied By: daughter Schedule Follow-up Appointment: Yes Clinical Summary of Care: Electronic Signature(s) Signed: 09/10/2018 4:45:46 PM By: Gretta Cool, BSN, RN, CWS, Kim RN, BSN Entered By: Gretta Cool, BSN, RN, CWS, Kim on 09/10/2018 12:53:55 Cristina [name], [name] ([id number]) -------------------------------------------------------------------------------- Lower Extremity Assessment Details Patient Name: Cristina [name], [name] Date of Service: 09/10/2018 12:30 PM Medical Record Number: [id number] Patient Account Number: [id number] Date of Birth/Sex: September 16, 1923 (85 y.o. F) Treating RN: Secundino Ginger Primary Care Tyneshia Stivers: Glendon Axe Other Clinician: Referring Aryianna Earwood: Glendon Axe Treating Hartman Minahan/Extender: STONE III, HOYT Weeks in Treatment: 5 Edema Assessment Assessed: [Left: No] [Right: No] [Left: Edema] [Right: :] Calf Left: Right: Point of Measurement: 30 cm From Medial Instep cm 33 cm Ankle Left: Right: Point of Measurement: 10 cm From Medial Instep cm 19 cm Vascular Assessment Claudication: Claudication Assessment [Right:None] Pulses: Dorsalis Pedis Palpable: [Right:Yes] Posterior Tibial Extremity colors, hair growth, and conditions: Extremity Color: [Right:Normal] Hair Growth on Extremity: [Right:No] Temperature of Extremity: [Right:Warm] Capillary Refill: [Right:< 3 seconds] Toe Nail Assessment Left: Right: Thick: No Discolored: No Deformed: No Improper Length and Hygiene: No Electronic Signature(s) Signed: 09/10/2018 2:52:40 PM By: Secundino Ginger Entered By: Secundino Ginger on 09/10/2018 12:39:59 Karbowski, Kacey C.  ([id number]) -------------------------------------------------------------------------------- Multi Wound Chart Details Patient Name: Cristina [name], [name] Date of Service: 09/10/2018 12:30 PM Medical Record Number: [id number] Patient Account Number: [id number] Date of Birth/Sex: 10-11-1923 (85 y.o. F) Treating RN: Cornell Barman Primary Care Sultan Pargas: Glendon Axe Other Clinician: Referring Jerric Oyen: Glendon Axe Treating Bennett Ram/Extender: STONE III, HOYT Weeks in Treatment: 5 Vital Signs Height(in): 65 Pulse(bpm): 65 Weight(lbs): 115 Blood Pressure(mmHg): 114/52 Body Mass Index(BMI): 19 Temperature(F): 97.7 Respiratory Rate  16 (breaths/min): Photos: [1:No Photos] [N/A:N/A] Wound Location: [1:Right Lower Leg - Anterior] [N/A:N/A] Wounding Event: [1:Trauma] [N/A:N/A] Primary Etiology: [1:Trauma, Other] [N/A:N/A] Comorbid History: [1:Arrhythmia, Congestive Heart Failure, End Stage Renal Disease] [N/A:N/A] Date Acquired: [1:03/12/2018] [N/A:N/A] Weeks of Treatment: [1:5] [N/A:N/A] Wound Status: [1:Open] [N/A:N/A] Measurements L x W x D [1:0.5x0.5x0.1] [N/A:N/A] (cm) Area (cm) : [1:0.196] [N/A:N/A] Volume (cm) : [1:0.02] [N/A:N/A] % Reduction in Area: [1:92.10%] [N/A:N/A] % Reduction in Volume: [1:91.90%] [N/A:N/A] Classification: [1:Unclassifiable] [N/A:N/A] Exudate Amount: [1:None Present] [N/A:N/A] Wound Margin: [1:Flat and Intact] [N/A:N/A] Granulation Amount: [1:None Present (0%)] [N/A:N/A] Necrotic Amount: [1:Large (67-100%)] [N/A:N/A] Necrotic Tissue: [1:Eschar] [N/A:N/A] Exposed Structures: [1:Fat Layer (Subcutaneous Tissue) Exposed: Yes Fascia: No Tendon: No Muscle: No Joint: No Bone: No] [N/A:N/A] Epithelialization: [1:None] [N/A:N/A] Periwound Skin Texture: [1:Scarring: Yes Excoriation: No Induration: No Callus: No Crepitus: No Rash: No] [N/A:N/A] Periwound Skin Moisture: [N/A:N/A] Maceration: No Dry/Scaly: No Periwound Skin Color: Ecchymosis: Yes N/A  N/A Atrophie Blanche: No Cyanosis: No Erythema: No Hemosiderin Staining: No Mottled: No Pallor: No Rubor: No Temperature: No Abnormality N/A N/A Tenderness on Palpation: Yes N/A N/A Wound Preparation: Ulcer Cleansing: N/A N/A Rinsed/Irrigated with Saline Topical Anesthetic Applied: Other: lidocaine 4% Treatment Notes Electronic Signature(s) Signed: 09/10/2018 4:45:46 PM By: Gretta Cool, BSN, RN, CWS, Kim RN, BSN Entered By: Gretta Cool, BSN, RN, CWS, Kim on 09/10/2018 12:50:59 Beavers, Rigoberto Noel ([id number]) -------------------------------------------------------------------------------- Multi-Disciplinary Care Plan Details Patient Name: Cristina [name], [name] Date of Service: 09/10/2018 12:30 PM Medical Record Number: [id number] Patient Account Number: [id number] Date of Birth/Sex: 05/17/1924 (85 y.o. F) Treating RN: Cornell Barman Primary Care Nil Bolser: Glendon Axe Other Clinician: Referring Serra Younan: Glendon Axe Treating Lynnae Ludemann/Extender: Melburn Hake, HOYT Weeks in Treatment: 5 Active Inactive Electronic Signature(s) Signed: 09/10/2018 5:25:35 PM By: Gretta Cool, BSN, RN, CWS, Kim RN, BSN Previous Signature: 09/10/2018 4:45:46 PM Version By: Gretta Cool, BSN, RN, CWS, Kim RN, BSN Entered By: Gretta Cool, BSN, RN, CWS, Kim on 09/10/2018 17:25:34 Cristina [name], [name] ([id number]) -------------------------------------------------------------------------------- Pain Assessment Details Patient Name: Cristina [name], [name] Date of Service: 09/10/2018 12:30 PM Medical Record Number: [id number] Patient Account Number: [id number] Date of Birth/Sex: 05-Jan-1924 (85 y.o. F) Treating RN: Secundino Ginger Primary Care Jaspreet Hollings: Glendon Axe Other Clinician: Referring Bonny Egger: Glendon Axe Treating Aria Pickrell/Extender: STONE III, HOYT Weeks in Treatment: 5 Active Problems Location of Pain Severity and Description of Pain Patient Has Paino No Site Locations Pain Management and Medication Current Pain Management: Notes pt  denies any pain at this time. Electronic Signature(s) Signed: 09/10/2018 2:52:40 PM By: Secundino Ginger Entered By: Secundino Ginger on 09/10/2018 12:34:35 Cristina [name], Cristina [name] Kitchen ([id number]) -------------------------------------------------------------------------------- Patient/Caregiver Education Details Patient Name: Cristina [name], [name] Date of Service: 09/10/2018 12:30 PM Medical Record Number: [id number] Patient Account Number: [id number] Date of Birth/Gender: 06-13-1924 (85 y.o. F) Treating RN: Cornell Barman Primary Care Physician: Glendon Axe Other Clinician: Referring Physician: Glendon Axe Treating Physician/Extender: Sharalyn Ink in Treatment: 5 Education Assessment Education Provided To: Patient Education Topics Provided Wound/Skin Impairment: Handouts: Caring for Your Ulcer Methods: Demonstration, Explain/Verbal Responses: State content correctly Electronic Signature(s) Signed: 09/10/2018 4:45:46 PM By: Gretta Cool, BSN, RN, CWS, Kim RN, BSN Entered By: Gretta Cool, BSN, RN, CWS, Kim on 09/10/2018 12:53:40 Cristina [name], Kiela C. ([id number]) -------------------------------------------------------------------------------- Wound Assessment Details Patient Name: Nachtigal, Titianna C. Date of Service: 09/10/2018 12:30 PM Medical Record Number: [id number] Patient Account Number: [id number] Date of Birth/Sex: [birth year] (85 y.o. F) Treating RN: Cornell Barman Primary Care Cordale Manera: Glendon Axe Other Clinician: Referring Stephonie Wilcoxen: Glendon Axe Treating Dearl Rudden/Extender: STONE III, HOYT Weeks in Treatment: 5 Wound Status Wound  Number: 1 Primary Trauma, Other Etiology: Wound Location: Right, Anterior Lower Leg Wound Status: Healed - Epithelialized Wounding Event: Trauma Comorbid Arrhythmia, Congestive Heart Failure, End Date Acquired: 03/12/2018 History: Stage Renal Disease Weeks Of Treatment: 5 Clustered Wound: No Photos Photo Uploaded By: Secundino Ginger on 09/10/2018 13:08:34 Wound  Measurements Length: (cm) 0 % Red Width: (cm) 0 % Red Depth: (cm) 0 Epith Area: (cm) 0 Tunn Volume: (cm) 0 Unde uction in Area: 100% uction in Volume: 100% elialization: None eling: No rmining: No Wound Description Classification: Unclassifiable Wound Margin: Flat and Intact Exudate Amount: None Present Foul Odor After Cleansing: No Slough/Fibrino No Wound Bed Granulation Amount: None Present (0%) Exposed Structure Necrotic Amount: Large (67-100%) Fascia Exposed: No Necrotic Quality: Eschar Fat Layer (Subcutaneous Tissue) Exposed: Yes Tendon Exposed: No Muscle Exposed: No Joint Exposed: No Bone Exposed: No Periwound Skin Texture Texture Color No Abnormalities Noted: No No Abnormalities Noted: No Laughter, Harjit C. ([id number]) Callus: No Atrophie Blanche: No Crepitus: No Cyanosis: No Excoriation: No Ecchymosis: Yes Induration: No Erythema: No Rash: No Hemosiderin Staining: No Scarring: Yes Mottled: No Pallor: No Moisture Rubor: No No Abnormalities Noted: No Dry / Scaly: No Temperature / Pain Maceration: No Temperature: No Abnormality Tenderness on Palpation: Yes Wound Preparation Ulcer Cleansing: Rinsed/Irrigated with Saline Topical Anesthetic Applied: Other: lidocaine 4%, Electronic Signature(s) Signed: 09/10/2018 4:45:46 PM By: Gretta Cool, BSN, RN, CWS, Kim RN, BSN Entered By: Gretta Cool, BSN, RN, CWS, Kim on 09/10/2018 12:52:09 Tesler, Rigoberto Noel ([id number]) -------------------------------------------------------------------------------- Vitals Details Patient Name: Holladay, Kip C. Date of Service: 09/10/2018 12:30 PM Medical Record Number: [id number] Patient Account Number: [id number] Date of Birth/Sex: 1924-05-11 (85 y.o. F) Treating RN: Secundino Ginger Primary Care Hanaan Gancarz: Glendon Axe Other Clinician: Referring Nimah Uphoff: Glendon Axe Treating Hensley Aziz/Extender: STONE III, HOYT Weeks in Treatment: 5 Vital Signs Time Taken: 12:34 Temperature  (F): 97.7 Height (in): 65 Pulse (bpm): 65 Weight (lbs): 115 Respiratory Rate (breaths/min): 16 Body Mass Index (BMI): 19.1 Blood Pressure (mmHg): 114/52 Reference Range: 80 - 120 mg / dl Airway Electronic Signature(s) Signed: 09/10/2018 2:52:40 PM By: Secundino Ginger Entered BySecundino Ginger on 09/10/2018 12:35:02

## 2019-04-17 ENCOUNTER — Other Ambulatory Visit: Payer: Self-pay

## 2019-04-17 ENCOUNTER — Encounter: Payer: Medicare Other | Attending: Internal Medicine | Admitting: Internal Medicine

## 2019-04-17 DIAGNOSIS — Z7901 Long term (current) use of anticoagulants: Secondary | ICD-10-CM | POA: Diagnosis not present

## 2019-04-17 DIAGNOSIS — N186 End stage renal disease: Secondary | ICD-10-CM | POA: Diagnosis not present

## 2019-04-17 DIAGNOSIS — I509 Heart failure, unspecified: Secondary | ICD-10-CM | POA: Diagnosis not present

## 2019-04-17 DIAGNOSIS — Z8673 Personal history of transient ischemic attack (TIA), and cerebral infarction without residual deficits: Secondary | ICD-10-CM | POA: Insufficient documentation

## 2019-04-17 DIAGNOSIS — I4891 Unspecified atrial fibrillation: Secondary | ICD-10-CM | POA: Diagnosis not present

## 2019-04-17 DIAGNOSIS — Z7902 Long term (current) use of antithrombotics/antiplatelets: Secondary | ICD-10-CM | POA: Diagnosis not present

## 2019-04-17 DIAGNOSIS — I350 Nonrheumatic aortic (valve) stenosis: Secondary | ICD-10-CM | POA: Insufficient documentation

## 2019-04-17 DIAGNOSIS — X58XXXA Exposure to other specified factors, initial encounter: Secondary | ICD-10-CM | POA: Insufficient documentation

## 2019-04-17 DIAGNOSIS — S8012XD Contusion of left lower leg, subsequent encounter: Secondary | ICD-10-CM | POA: Insufficient documentation

## 2019-04-17 DIAGNOSIS — Z8249 Family history of ischemic heart disease and other diseases of the circulatory system: Secondary | ICD-10-CM | POA: Insufficient documentation

## 2019-04-17 DIAGNOSIS — S8011XD Contusion of right lower leg, subsequent encounter: Secondary | ICD-10-CM | POA: Insufficient documentation

## 2019-04-17 DIAGNOSIS — I872 Venous insufficiency (chronic) (peripheral): Secondary | ICD-10-CM | POA: Insufficient documentation

## 2019-04-17 DIAGNOSIS — I132 Hypertensive heart and chronic kidney disease with heart failure and with stage 5 chronic kidney disease, or end stage renal disease: Secondary | ICD-10-CM | POA: Insufficient documentation

## 2019-04-17 NOTE — Progress Notes (Signed)
Raya, Grainne C. ([id number]) Visit Report for 04/17/2019 Abuse/Suicide Risk Screen Details Patient Name: Cristina [name], [name] Date of Service: 04/17/2019 8:00 AM Medical Cristina [name] Number: [id number] Patient Account Number: [id number] Date of Birth/Sex: [birth year] (85 y.o. F) Treating RN: Army Melia Primary Care Beatryce Colombo: Paulita Cradle Other Clinician: Referring Avyon Herendeen: Paulita Cradle Treating Christine Morton/Extender: Tito Dine in Treatment: 0 Abuse/Suicide Risk Screen Items Answer ABUSE RISK SCREEN: Has anyone close to you tried to hurt or harm you recentlyo No Do you feel uncomfortable with anyone in your familyo No Has anyone forced you do things that you didnot want to doo No Electronic Signature(s) Signed: 04/17/2019 10:38:13 AM By: Army Melia Entered By: Army Melia on 04/17/2019 08:30:18 Cristina [name], [name] ([id number]) -------------------------------------------------------------------------------- Activities of Daily Living Details Patient Name: Huston, Ahnesty C. Date of Service: 04/17/2019 8:00 AM Medical Cristina [name] Number: [id number] Patient Account Number: [id number] Date of Birth/Sex: Jan 16, 1924 (85 y.o. F) Treating RN: Army Melia Primary Care Laritza Vokes: Paulita Cradle Other Clinician: Referring Jiah Bari: Paulita Cradle Treating Eileene Kisling/Extender: Tito Dine in Treatment: 0 Activities of Daily Living Items Answer Activities of Daily Living (Please select one for each item) Drive Automobile Not Able Take Medications Completely Able Use Telephone Completely Able Care for Appearance Completely Able Use Toilet Completely Able Bath / Shower Completely Able Dress Self Completely Able Feed Self Completely Able Walk Completely Able Get In / Out Bed Completely Able Housework Need Assistance Prepare Meals Completely Able Handle Money Completely Able Shop for Self Completely Able Electronic Signature(s) Signed: 04/17/2019 10:38:13 AM By:  Army Melia Entered By: Army Melia on 04/17/2019 08:30:39 Cristina [name], [name] ([id number]) -------------------------------------------------------------------------------- Education Screening Details Patient Name: Cristina [name], [name] Date of Service: 04/17/2019 8:00 AM Medical Cristina [name] Number: [id number] Patient Account Number: [id number] Date of Birth/Sex: Sep 28, 1923 (85 y.o. F) Treating RN: Army Melia Primary Care Heron Pitcock: Paulita Cradle Other Clinician: Referring Lauro Manlove: Paulita Cradle Treating Letecia Arps/Extender: Tito Dine in Treatment: 0 Primary Learner Assessed: Patient Learning Preferences/Education Level/Primary Language Learning Preference: Explanation, Demonstration Highest Education Level: High School Preferred Language: English Cognitive Barrier Language Barrier: No Translator Needed: No Memory Deficit: No Emotional Barrier: No Cultural/Religious Beliefs Affecting Medical Care: No Physical Barrier Impaired Vision: No Impaired Hearing: No Decreased Hand dexterity: No Knowledge/Comprehension Knowledge Level: High Comprehension Level: High Ability to understand written High instructions: Ability to understand verbal High instructions: Motivation Anxiety Level: Calm Cooperation: Cooperative Education Importance: Acknowledges Need Interest in Health Problems: Asks Questions Perception: Coherent Willingness to Engage in Self- High Management Activities: Readiness to Engage in Self- High Management Activities: Electronic Signature(s) Signed: 04/17/2019 10:38:13 AM By: Army Melia Entered By: Army Melia on 04/17/2019 08:31:02 Cristina [name], [name] ([id number]) -------------------------------------------------------------------------------- Fall Risk Assessment Details Patient Name: Cristina [name], [name] Date of Service: 04/17/2019 8:00 AM Medical Cristina [name] Number: [id number] Patient Account Number: [id number] Date of Birth/Sex: 05-Dec-1923 (85  y.o. F) Treating RN: Army Melia Primary Care Gael Delude: Paulita Cradle Other Clinician: Referring Daleiza Bacchi: Paulita Cradle Treating Phoebie Shad/Extender: Tito Dine in Treatment: 0 Fall Risk Assessment Items Have you had 2 or more falls in the last 12 monthso 0 No Have you had any fall that resulted in injury in the last 12 monthso 0 No FALLS RISK SCREEN History of falling - immediate or within 3 months 0 No Secondary diagnosis (Do you have 2 or more medical diagnoseso) 0 No Ambulatory aid None/bed rest/wheelchair/nurse 0 No Crutches/cane/walker 15 Yes Furniture 0 No Intravenous therapy Access/Saline/Heparin Lock 0 No Gait/Transferring Normal/ bed rest/ wheelchair 0 No Weak (  short steps with or without shuffle, stooped but able to lift head while 10 Yes walking, may seek support from furniture) Impaired (short steps with shuffle, may have difficulty arising from chair, head 20 Yes down, impaired balance) Mental Status Oriented to own ability 0 No Electronic Signature(s) Signed: 04/17/2019 10:38:13 AM By: Army Melia Entered By: Army Melia on 04/17/2019 08:31:26 Cristina [name], [name] ([id number]) -------------------------------------------------------------------------------- Foot Assessment Details Patient Name: Cristina [name], [name] Date of Service: 04/17/2019 8:00 AM Medical Cristina [name] Number: [id number] Patient Account Number: [id number] Date of Birth/Sex: Oct 18, 1923 (85 y.o. F) Treating RN: Army Melia Primary Care Torsha Lemus: Paulita Cradle Other Clinician: Referring Allix Blomquist: Paulita Cradle Treating Marcianna Daily/Extender: Tito Dine in Treatment: 0 Foot Assessment Items Site Locations + = Sensation present, - = Sensation absent, C = Callus, U = Ulcer R = Redness, W = Warmth, M = Maceration, PU = Pre-ulcerative lesion F = Fissure, S = Swelling, D = Dryness Assessment Right: Left: Other Deformity: No No Prior Foot Ulcer: No No Prior  Amputation: No No Charcot Joint: No No Ambulatory Status: Ambulatory With Help Assistance Device: Cane Gait: Steady Electronic Signature(s) Signed: 04/17/2019 10:38:13 AM By: Army Melia Entered By: Army Melia on 04/17/2019 08:32:09 Cristina [name], [name] ([id number]) -------------------------------------------------------------------------------- Nutrition Risk Screening Details Patient Name: Cristina [name], [name] Date of Service: 04/17/2019 8:00 AM Medical Cristina [name] Number: [id number] Patient Account Number: [id number] Date of Birth/Sex: Oct 28, 1923 (85 y.o. F) Treating RN: Army Melia Primary Care Amiya Escamilla: Paulita Cradle Other Clinician: Referring Ikeya Brockel: Paulita Cradle Treating Fleetwood Pierron/Extender: Tito Dine in Treatment: 0 Height (in): 65 Weight (lbs): 110 Body Mass Index (BMI): 18.3 Nutrition Risk Screening Items Score Screening NUTRITION RISK SCREEN: I have an illness or condition that made me change the kind and/or amount of 0 No food I eat I eat fewer than two meals per day 0 No I eat few fruits and vegetables, or milk products 0 No I have three or more drinks of beer, liquor or wine almost every day 0 No I have tooth or mouth problems that make it hard for me to eat 0 No I don't always have enough money to buy the food I need 0 No I eat alone most of the time 0 No I take three or more different prescribed or over-the-counter drugs a day 0 No Without wanting to, I have lost or gained 10 pounds in the last six months 0 No I am not always physically able to shop, cook and/or feed myself 0 No Nutrition Protocols Good Risk Protocol 0 No interventions needed Moderate Risk Protocol High Risk Proctocol Risk Level: Good Risk Score: 0 Electronic Signature(s) Signed: 04/17/2019 10:38:13 AM By: Army Melia Entered By: Army Melia on 04/17/2019 08:31:34

## 2019-04-19 NOTE — Progress Notes (Signed)
Heckert, Navina C. ([id number]) Visit Report for 04/17/2019 Allergy List Details Patient Name: Cristina [name], [name] Date of Service: 04/17/2019 8:00 AM Medical Record Number: [id number] Patient Account Number: [id number] Date of Birth/Sex: 1923/08/28 (85 y.o. F) Treating RN: Army Melia Primary Care Laelia Angelo: Paulita Cradle Other Clinician: Referring Tymar Polyak: Paulita Cradle Treating Frankee Gritz/Extender: Ricard Dillon Weeks in Treatment: 0 Allergies Active Allergies Eliquis Allergy Notes Electronic Signature(s) Signed: 04/17/2019 10:38:13 AM By: Army Melia Entered By: Army Melia on 04/17/2019 08:27:05 Pociask, Apurva C. ([id number]) -------------------------------------------------------------------------------- Arrival Information Details Patient Name: Cristina [name], [name] Date of Service: 04/17/2019 8:00 AM Medical Record Number: [id number] Patient Account Number: [id number] Date of Birth/Sex: 1924-05-22 (85 y.o. F) Treating RN: Army Melia Primary Care Latoria Dry: Paulita Cradle Other Clinician: Referring Jaevion Goto: Paulita Cradle Treating Cristina [name]/Extender: Cristina [name] in Treatment: 0 Visit Information Patient Arrived: Ambulatory Arrival Time: 08:16 Accompanied By: daughter Transfer Assistance: None Patient Identification Verified: Yes Patient Has Alerts: Yes Patient Alerts: Patient on Blood Thinner Pt taking plavix History Since Last Visit Added or deleted any medications: No Any new allergies or adverse reactions: No Had a fall or experienced change in activities of daily living that may affect risk of falls: No Signs or symptoms of abuse/neglect since last visito No Hospitalized since last visit: No Has Dressing in Place as Prescribed: Yes Electronic Signature(s) Signed: 04/17/2019 10:38:13 AM By: Army Melia Entered By: Army Melia on 04/17/2019 08:18:28 Cristina [name], [name]  ([id number]) -------------------------------------------------------------------------------- Clinic Level of Care Assessment Details Patient Name: [name], [name] Date of Service: 04/17/2019 8:00 AM Medical Record Number: [id number] Patient Account Number: [id number] Date of Birth/Sex: 1924-01-28 (85 y.o. F) Treating RN: Cornell Barman Primary Care Vertis Bauder: Paulita Cradle Other Clinician: Referring Demetric Parslow: Paulita Cradle Treating Shermaine Brigham/Extender: Cristina [name] in Treatment: 0 Clinic Level of Care Assessment Items TOOL 1 Quantity Score []  - Use when EandM and Procedure is performed on INITIAL visit 0 ASSESSMENTS - Nursing Assessment / Reassessment X - General Physical Exam (combine w/ comprehensive assessment (listed just below) when 1 20 performed on new pt. evals) X- 1 25 Comprehensive Assessment (HX, ROS, Risk Assessments, Wounds Hx, etc.) ASSESSMENTS - Wound and Skin Assessment / Reassessment []  - Dermatologic / Skin Assessment (not related to wound area) 0 ASSESSMENTS - Ostomy and/or Continence Assessment and Care []  - Incontinence Assessment and Management 0 []  - 0 Ostomy Care Assessment and Management (repouching, etc.) PROCESS - Coordination of Care X - Simple Patient / Family Education for ongoing care 1 15 []  - 0 Complex (extensive) Patient / Family Education for ongoing care []  - 0 Staff obtains Programmer, systems, Records, Test Results / Process Orders []  - 0 Staff telephones HHA, Nursing Homes / Clarify orders / etc []  - 0 Routine Transfer to another Facility (non-emergent condition) []  - 0 Routine Hospital Admission (non-emergent condition) X- 1 15 New Admissions / Biomedical engineer / Ordering NPWT, Apligraf, etc. []  - 0 Emergency Hospital Admission (emergent condition) PROCESS - Special Needs []  - Pediatric / Minor Patient Management 0 []  - 0 Isolation Patient Management []  - 0 Hearing / Language / Visual special needs []  - 0 Assessment  of Community assistance (transportation, D/C planning, etc.) []  - 0 Additional assistance / Altered mentation []  - 0 Support Surface(s) Assessment (bed, cushion, seat, etc.) Cristina [name], [name] ([id number]) INTERVENTIONS - Miscellaneous []  - External ear exam 0 []  - 0 Patient Transfer (multiple staff / Civil Service fast streamer / Similar devices) []  - 0 Simple Staple / Suture removal (25 or less) []  -  0 Complex Staple / Suture removal (26 or more) []  - 0 Hypo/Hyperglycemic Management (do not check if billed separately) []  - 0 Ankle / Brachial Index (ABI) - do not check if billed separately Has the patient been seen at the hospital within the last three years: Yes Total Score: 75 Level Of Care: New/Established - Level 2 Electronic Signature(s) Signed: 04/19/2019 10:04:45 AM By: Gretta Cool, BSN, RN, CWS, Kim RN, BSN Entered By: Gretta Cool, BSN, RN, CWS, Kim on 04/17/2019 08:48:27 Cristina [name], [name] ([id number]) -------------------------------------------------------------------------------- Encounter Discharge Information Details Patient Name: [name], [name] Date of Service: 04/17/2019 8:00 AM Medical Record Number: [id number] Patient Account Number: [id number] Date of Birth/Sex: 02-25-1924 (85 y.o. F) Treating RN: Cornell Barman Primary Care Andrya Roppolo: Paulita Cradle Other Clinician: Referring Lannis Lichtenwalner: Paulita Cradle Treating Monti Villers/Extender: Cristina [name] in Treatment: 0 Encounter Discharge Information Items Discharge Condition: Stable Ambulatory Status: Ambulatory Discharge Destination: Home Transportation: Private Auto Accompanied By: family Schedule Follow-up Appointment: Yes Clinical Summary of Care: Electronic Signature(s) Signed: 04/19/2019 10:04:45 AM By: Gretta Cool, BSN, RN, CWS, Kim RN, BSN Entered By: Gretta Cool, BSN, RN, CWS, Kim on 04/17/2019 09:01:32 Cristina [name], [name] ([id number]) -------------------------------------------------------------------------------- Lower Extremity  Assessment Details Patient Name: Cristina [name], [name] Date of Service: 04/17/2019 8:00 AM Medical Record Number: [id number] Patient Account Number: [id number] Date of Birth/Sex: [birth year] (85 y.o. F) Treating RN: Army Melia Primary Care Wilbert Hayashi: Paulita Cradle Other Clinician: Referring Dion Sibal: Paulita Cradle Treating Amil Moseman/Extender: Ricard Dillon Weeks in Treatment: 0 Edema Assessment Assessed: [Left: No] [Right: No] Edema: [Left: Yes] [Right: No] Calf Left: Right: Point of Measurement: 32 cm From Medial Instep 33 cm 33 cm Ankle Left: Right: Point of Measurement: 10 cm From Medial Instep 20 cm 19 cm Vascular Assessment Pulses: Dorsalis Pedis Palpable: [Left:Yes] [Right:Yes] Notes unable to complete ABI d/t location of wound Electronic Signature(s) Signed: 04/17/2019 8:37:32 AM By: Army Melia Entered By: Army Melia on 04/17/2019 08:37:32 Ricci, Manju C. ([id number]) -------------------------------------------------------------------------------- Multi Wound Chart Details Patient Name: Cristina [name], [name] Date of Service: 04/17/2019 8:00 AM Medical Record Number: [id number] Patient Account Number: [id number] Date of Birth/Sex: 1923-09-13 (85 y.o. F) Treating RN: Cornell Barman Primary Care Santiago Stenzel: Paulita Cradle Other Clinician: Referring Surah Pelley: Paulita Cradle Treating Prue Lingenfelter/Extender: Cristina [name] in Treatment: 0 Vital Signs Height(in): 65 Pulse(bpm): 56 Weight(lbs): 110 Blood Pressure(mmHg): 104/43 Body Mass Index(BMI): 18 Temperature(F): 98.2 Respiratory Rate 16 (breaths/min): Photos: [N/A:N/A] Wound Location: Left Lower Leg - Anterior N/A N/A Wounding Event: Trauma N/A N/A Primary Etiology: Trauma, Other N/A N/A Date Acquired: 04/14/2019 N/A N/A Weeks of Treatment: 0 N/A N/A Wound Status: Open N/A N/A Measurements L x W x D 5x3.5x0.1 N/A N/A (cm) Area (cm) : 13.744 N/A N/A Volume (cm) : 1.374 N/A  N/A Classification: Unclassifiable N/A N/A Exudate Amount: None Present N/A N/A Granulation Amount: None Present (0%) N/A N/A Necrotic Amount: None Present (0%) N/A N/A Exposed Structures: Fascia: No N/A N/A Fat Layer (Subcutaneous Tissue) Exposed: No Tendon: No Muscle: No Joint: No Bone: No Limited to Skin Breakdown Epithelialization: None N/A N/A Treatment Notes Electronic Signature(s) Signed: 04/17/2019 5:28:46 PM By: Linton Ham MD Welton, Rigoberto Noel ([id number]) Entered By: Linton Ham on 04/17/2019 08:50:47 Wolfgang, Morayo C. ([id number]) -------------------------------------------------------------------------------- Multi-Disciplinary Care Plan Details Patient Name: Cristina [name], [name] Date of Service: 04/17/2019 8:00 AM Medical Record Number: [id number] Patient Account Number: [id number] Date of Birth/Sex: 06-May-1924 (85 y.o. F) Treating RN: Cornell Barman Primary Care Telesha Deguzman: Paulita Cradle Other Clinician: Referring Voyd Groft: Paulita Cradle Treating Aldair Rickel/Extender: Cristina [name]  in Treatment: 0 Active Inactive Abuse / Safety / Falls / Self Care Management Nursing Diagnoses: Potential for falls Goals: Patient will remain injury free related to falls Date Initiated: 04/17/2019 Target Resolution Date: 05/24/2019 Goal Status: Active Interventions: Assess fall risk on admission and as needed Notes: Orientation to the Wound Care Program Nursing Diagnoses: Knowledge deficit related to the wound healing center program Goals: Patient/caregiver will verbalize understanding of the [place] Program Date Initiated: 04/17/2019 Target Resolution Date: 05/24/2019 Goal Status: Active Interventions: Provide education on orientation to the wound center Notes: Wound/Skin Impairment Nursing Diagnoses: Impaired tissue integrity Goals: Ulcer/skin breakdown will have a volume reduction of 30% by week 4 Date Initiated: 04/17/2019 Target  Resolution Date: 05/17/2019 Goal Status: Active Interventions: Assess ulceration(s) every visit Cristina [name], [name] ([id number]) Treatment Activities: Skin care regimen initiated : 04/17/2019 Notes: Electronic Signature(s) Signed: 04/19/2019 10:04:45 AM By: Gretta Cool, BSN, RN, CWS, Kim RN, BSN Entered By: Gretta Cool, BSN, RN, CWS, Kim on 04/17/2019 08:40:46 Cristina [name], [name] ([id number]) -------------------------------------------------------------------------------- Pain Assessment Details Patient Name: Gibler, [name] Date of Service: 04/17/2019 8:00 AM Medical Record Number: [id number] Patient Account Number: [id number] Date of Birth/Sex: 08/15/1924 (85 y.o. F) Treating RN: Army Melia Primary Care Pearlena Ow: Paulita Cradle Other Clinician: Referring Lasya Vetter: Paulita Cradle Treating Rozanna Cormany/Extender: Ricard Dillon Weeks in Treatment: 0 Active Problems Location of Pain Severity and Description of Pain Patient Has Paino No Site Locations Pain Management and Medication Current Pain Management: Electronic Signature(s) Signed: 04/17/2019 10:38:13 AM By: Army Melia Entered By: Army Melia on 04/17/2019 08:21:30 Cristina [name], [name] ([id number]) -------------------------------------------------------------------------------- Patient/Caregiver Education Details Patient Name: Lyons, Charonda C. Date of Service: 04/17/2019 8:00 AM Medical Record Number: [id number] Patient Account Number: [id number] Date of Birth/Gender: 06-23-1924 (85 y.o. F) Treating RN: Cornell Barman Primary Care Physician: Paulita Cradle Other Clinician: Referring Physician: Paulita Cradle Treating Physician/Extender: Cristina [name] in Treatment: 0 Education Assessment Education Provided To: Patient and Caregiver Education Topics Provided Welcome To [facility]: Handouts: Welcome To [facility] Methods: Demonstration, Explain/Verbal Responses: State content  correctly Wound/Skin Impairment: Handouts: Caring for Your Ulcer Methods: Demonstration, Explain/Verbal Responses: State content correctly Electronic Signature(s) Signed: 04/19/2019 10:04:45 AM By: Gretta Cool, BSN, RN, CWS, Kim RN, BSN Entered By: Gretta Cool, BSN, RN, CWS, Kim on 04/17/2019 08:49:23 Heberle, Terita C. ([id number]) -------------------------------------------------------------------------------- Wound Assessment Details Patient Name: Chiappetta, Benigna C. Date of Service: 04/17/2019 8:00 AM Medical Record Number: [id number] Patient Account Number: [id number] Date of Birth/Sex: 01/25/1924 (85 y.o. F) Treating RN: Army Melia Primary Care Sanye Ledesma: Paulita Cradle Other Clinician: Referring Naketa Daddario: Paulita Cradle Treating Leontae Bostock/Extender: Cristina [name] in Treatment: 0 Wound Status Wound Number: 2 Primary Etiology: Trauma, Other Wound Location: Left Lower Leg - Anterior Wound Status: Open Wounding Event: Trauma Date Acquired: 04/14/2019 Weeks Of Treatment: 0 Clustered Wound: No Photos Wound Measurements Length: (cm) 5 % Reduction Width: (cm) 3.5 % Reduction Depth: (cm) 0.1 Epithelializ Area: (cm) 13.744 Tunneling: Volume: (cm) 1.374 Undermining in Area: in Volume: ation: None No : No Wound Description Classification: Unclassifiable Foul Odor Af Exudate Amount: None Present Slough/Fibri ter Cleansing: No no No Wound Bed Granulation Amount: None Present (0%) Exposed Structure Necrotic Amount: None Present (0%) Fascia Exposed: No Fat Layer (Subcutaneous Tissue) Exposed: No Tendon Exposed: No Muscle Exposed: No Joint Exposed: No Bone Exposed: No Limited to Skin Breakdown Treatment Notes Wound #2 (Left, Anterior Lower Leg) Urbieta, Lorre C. ([id number]) Notes ABD, 3-Layer Left, unna to anchor Electronic Signature(s) Signed: 04/17/2019 10:38:13 AM By:  Nicki Reaper, Dajea Entered By: Army Melia on 04/17/2019 08:25:16 Mazzie, Cristianna C.  ([id number]) -------------------------------------------------------------------------------- Vitals Details Patient Name: Maggard, Janda C. Date of Service: 04/17/2019 8:00 AM Medical Record Number: [id number] Patient Account Number: [id number] Date of Birth/Sex: March 26, 1924 (85 y.o. F) Treating RN: Army Melia Primary Care Perley Arthurs: Paulita Cradle Other Clinician: Referring Markeshia Giebel: Paulita Cradle Treating Isaly Fasching/Extender: Cristina [name] in Treatment: 0 Vital Signs Time Taken: 08:20 Temperature (F): 98.2 Height (in): 65 Pulse (bpm): 56 Source: Stated Respiratory Rate (breaths/min): 16 Weight (lbs): 110 Blood Pressure (mmHg): 104/43 Source: Stated Reference Range: 80 - 120 mg / dl Body Mass Index (BMI): 18.3 Electronic Signature(s) Signed: 04/17/2019 10:38:13 AM By: Army Melia Entered By: Army Melia on 04/17/2019 08:22:33

## 2019-04-19 NOTE — Progress Notes (Signed)
Roseland, Tanaysha C. ([id number]) Visit Report for 04/17/2019 Chief Complaint Document Details Patient Name: Cristina [name], [name] Date of Service: 04/17/2019 8:00 AM Medical Record Number: [id number] Patient Account Number: [id number] Date of Birth/Sex: 01-26-1924 (85 y.o. F) Treating RN: Cornell Barman Primary Care Provider: Paulita Cradle Other Clinician: Referring Provider: Paulita Cradle Treating Provider/Extender: Tito Dine in Treatment: 0 Information Obtained from: Patient Chief Complaint Right anterior LE Ulcer 04/17/2019; patient comes in for evaluation of a hematoma on the right anterior mid tibia level Electronic Signature(s) Signed: 04/17/2019 5:28:46 PM By: Linton Ham MD Entered By: Linton Ham on 04/17/2019 08:51:50 Cristina [name], [name] ([id number]) -------------------------------------------------------------------------------- HPI Details Patient Name: Rodenbeck, [name] Date of Service: 04/17/2019 8:00 AM Medical Record Number: [id number] Patient Account Number: [id number] Date of Birth/Sex: 10-01-1923 (85 y.o. F) Treating RN: Cornell Barman Primary Care Provider: Paulita Cradle Other Clinician: Referring Provider: Paulita Cradle Treating Provider/Extender: Tito Dine in Treatment: 0 History of Present Illness HPI Description: 08/06/18 on evaluation today patient actually presents for initial evaluation concerning a right anterior lower extremity ulcer which has been present for 4-5 months. She is a very poor historian really does not know if she tripped or how in fact she fell and injured this area. Upon further questioning it appears she actually has dimension I will explain a scenario such as how to perform the dressing changes and then she will ask me questions such as how often it should be done when I very specifically pointed this out to her. I definitely think that there is a strong component of dementia here leading to her  poor history. She does have a history of atrial fibrillation as will a stroke and is on blood thinners chronically for this. She also has stage IV chronic kidney disease. Patient's wound over the anterior lower extremity of the right leg actually show signs of being completely eschar covered upon my initial evaluation today. No fevers, chills, nausea, or vomiting noted at this time. 08/20/18 on evaluation today patient's wound actually appears to be doing significantly better which is excellent news. She has been having her daughter change the dressing's which I think has worked out great for her. With that being said we had previously ordered home health although they never actually got started simply due to the fact that I was concerned about the patient's dimension of her being able to actually perform the dressing changes appropriately of her own accord. Nonetheless with her daughter taking care of this for her I see no issues and they seem to be doing excellent at this point. 09/03/18 on evaluation today patient's wound actually appears to be doing much better which is good news. With that being said she did initially have a lot of dry drainage on the surface of the wound. This is subsequently due to the fact that on Friday patient's daughter [name] she will move the dressing to clean the area and then forgot to reapply the dressing before she left her mother's home. She subsequently called her father to get him to apply it but when she picked her mother up today to bring her to the appointment this still did not have a dressing on it. Nonetheless things do not appear to be doing too poorly which is good news overall I do feel like she's showing signs of improvement. 09/10/18 on evaluation today patient's wound actually appears to be completely healed which is excellent news. Overall she's been tolerating the dressing changes without complication. READMISSION 04/17/2019  This is a 85 year old woman  who has a history of a wound on her right anterior lower extremity of uncertain etiology in late 2019 early 2020. She was seen in this clinic and healed out with standard care. She has chronic venous insufficiency. She does not wear stockings. She is not a diabetic. Noticed by her husband to have a fairly large hematoma on the right anterior lower leg. They are here for our review of this. She is on Plavix and aspirin presumably for prophylaxis and atrial fibrillation. They have not been doing anything to the wound. The patient has a history of atrial fibrillation on Plavix and aspirin, dementia for which she has not had an evaluation history of TIA/CVA, stage IV chronic renal failure, aortic stenosis. We did not do ABIs because of location of the area. Electronic Signature(s) Signed: 04/17/2019 5:28:46 PM By: Linton Ham MD Entered By: Linton Ham on 04/17/2019 08:53:59 Cristina [name], [name] ([id number]) -------------------------------------------------------------------------------- Physical Exam Details Patient Name: Cristina [name], [name] Date of Service: 04/17/2019 8:00 AM Medical Record Number: [id number] Patient Account Number: [id number] Date of Birth/Sex: 1924/01/29 (85 y.o. F) Treating RN: Cornell Barman Primary Care Provider: Paulita Cradle Other Clinician: Referring Provider: Paulita Cradle Treating Provider/Extender: Ricard Dillon Weeks in Treatment: 0 Constitutional Sitting or standing Blood Pressure is within target range for patient.. Pulse regular and within target range for patient.Marland Kitchen Respirations regular, non-labored and within target range.. Temperature is normal and within the target range for the patient.Marland Kitchen appears in no distress. Eyes Conjunctivae clear. No discharge. Cardiovascular Heart sounds soft I did not hear a systolic murmur. She appears to be euvolemic. Femoral pulses palpable. Pedal pulses are palpable on the right. Edema present in both extremities.  She has changes of chronic venous insufficiency.. Lymphatic None palpable in the popliteal or inguinal area. Integumentary (Hair, Skin) Large amount of subcutaneous hemosiderin on the right anterior leg presumably the result of the same trauma as the hematoma she has.. Psychiatric Obvious cognitive difficulties. Notes Wound exam; the area in question is on the right anterior lower tibia area. Large superficial hematoma. There is surrounding hemosiderin deposition in the right leg and changes of chronic venous insufficiency. There is no opening yet to the hematoma but I suspect this will spontaneously open at some point. Electronic Signature(s) Signed: 04/17/2019 5:28:46 PM By: Linton Ham MD Entered By: Linton Ham on 04/17/2019 09:01:11 Cristina [name], [name] ([id number]) -------------------------------------------------------------------------------- Physician Orders Details Patient Name: Veasey, Alizaya C. Date of Service: 04/17/2019 8:00 AM Medical Record Number: [id number] Patient Account Number: [id number] Date of Birth/Sex: [birth year] (85 y.o. F) Treating RN: Cornell Barman Primary Care Provider: Paulita Cradle Other Clinician: Referring Provider: Paulita Cradle Treating Provider/Extender: Tito Dine in Treatment: 0 Verbal / Phone Orders: No Diagnosis Coding Wound Cleansing Wound #2 Left,Anterior Lower Leg o Dial antibacterial soap, wash wounds, rinse and pat dry prior to dressing wounds Primary Wound Dressing Wound #2 Left,Anterior Lower Leg o ABD Pad Dressing Change Frequency Wound #2 Left,Anterior Lower Leg o Change dressing every week Follow-up Appointments Wound #2 Left,Anterior Lower Leg o Return Appointment in 1 week. o Nurse Visit as needed Edema Control Wound #2 Left,Anterior Lower Leg o 3 Layer Compression System - Left Lower Extremity - unna to anchor Additional Orders / Instructions Wound #2 Left,Anterior Lower Leg o  Activity as tolerated Electronic Signature(s) Signed: 04/17/2019 5:28:46 PM By: Linton Ham MD Signed: 04/19/2019 10:04:45 AM By: Gretta Cool, BSN, RN, CWS, Kim RN, BSN Entered By: Gretta Cool, BSN, RN, CWS, Kim on  04/17/2019 08:50:54 Tew, Addysin C. ([id number]) -------------------------------------------------------------------------------- Problem List Details Patient Name: Garber, Pavielle C. Date of Service: 04/17/2019 8:00 AM Medical Record Number: [id number] Patient Account Number: [id number] Date of Birth/Sex: 01/21/1924 (85 y.o. F) Treating RN: Cornell Barman Primary Care Provider: Paulita Cradle Other Clinician: Referring Provider: Paulita Cradle Treating Provider/Extender: Tito Dine in Treatment: 0 Active Problems ICD-10 Evaluated Encounter Code Description Active Date Today Diagnosis S80.11XD Contusion of right lower leg, subsequent encounter 04/17/2019 No Yes I87.321 Chronic venous hypertension (idiopathic) with inflammation of 04/17/2019 No Yes right lower extremity Inactive Problems Resolved Problems Electronic Signature(s) Signed: 04/17/2019 5:28:46 PM By: Linton Ham MD Entered By: Linton Ham on 04/17/2019 08:50:37 Cristina [name], [name] ([id number]) -------------------------------------------------------------------------------- Progress Note Details Patient Name: Cristina [name], [name] Date of Service: 04/17/2019 8:00 AM Medical Record Number: [id number] Patient Account Number: [id number] Date of Birth/Sex: 1924-04-16 (85 y.o. F) Treating RN: Cornell Barman Primary Care Provider: Paulita Cradle Other Clinician: Referring Provider: Paulita Cradle Treating Provider/Extender: Tito Dine in Treatment: 0 Subjective Chief Complaint Information obtained from Patient Right anterior LE Ulcer 04/17/2019; patient comes in for evaluation of a hematoma on the right anterior mid tibia level History of Present Illness (HPI) 08/06/18 on evaluation today  patient actually presents for initial evaluation concerning a right anterior lower extremity ulcer which has been present for 4-5 months. She is a very poor historian really does not know if she tripped or how in fact she fell and injured this area. Upon further questioning it appears she actually has dimension I will explain a scenario such as how to perform the dressing changes and then she will ask me questions such as how often it should be done when I very specifically pointed this out to her. I definitely think that there is a strong component of dementia here leading to her poor history. She does have a history of atrial fibrillation as will a stroke and is on blood thinners chronically for this. She also has stage IV chronic kidney disease. Patient's wound over the anterior lower extremity of the right leg actually show signs of being completely eschar covered upon my initial evaluation today. No fevers, chills, nausea, or vomiting noted at this time. 08/20/18 on evaluation today patient's wound actually appears to be doing significantly better which is excellent news. She has been having her daughter change the dressing's which I think has worked out great for her. With that being said we had previously ordered home health although they never actually got started simply due to the fact that I was concerned about the patient's dimension of her being able to actually perform the dressing changes appropriately of her own accord. Nonetheless with her daughter taking care of this for her I see no issues and they seem to be doing excellent at this point. 09/03/18 on evaluation today patient's wound actually appears to be doing much better which is good news. With that being said she did initially have a lot of dry drainage on the surface of the wound. This is subsequently due to the fact that on Friday patient's daughter [name] she will move the dressing to clean the area and then forgot to reapply the  dressing before she left her mother's home. She subsequently called her father to get him to apply it but when she picked her mother up today to bring her to the appointment this still did not have a dressing on it. Nonetheless things do not appear to be doing too poorly which  is good news overall I do feel like she's showing signs of improvement. 09/10/18 on evaluation today patient's wound actually appears to be completely healed which is excellent news. Overall she's been tolerating the dressing changes without complication. READMISSION 04/17/2019 This is a 85 year old woman who has a history of a wound on her right anterior lower extremity of uncertain etiology in late 2019 early 2020. She was seen in this clinic and healed out with standard care. She has chronic venous insufficiency. She does not wear stockings. She is not a diabetic. Noticed by her husband to have a fairly large hematoma on the right anterior lower leg. They are here for our review of this. She is on Plavix and aspirin presumably for prophylaxis and atrial fibrillation. They have not been doing anything to the wound. The patient has a history of atrial fibrillation on Plavix and aspirin, dementia for which she has not had an evaluation history of TIA/CVA, stage IV chronic renal failure, aortic stenosis. We did not do ABIs because of location of the area. Patient History Mikel, Berry C. ([id number]) Information obtained from Patient. Allergies Eliquis Family History Heart Disease - Father, Hypertension - Father, Seizures - Father, No family history of Cancer, Diabetes, Hereditary Spherocytosis, Kidney Disease, Lung Disease, Stroke, Thyroid Problems, Tuberculosis. Social History Never smoker, Marital Status - Married, Alcohol Use - Never, Drug Use - No History, Caffeine Use - Daily. Medical History Eyes Patient has history of Cataracts - bilateral Denies history of Glaucoma, Optic  Neuritis Ear/Nose/Mouth/Throat Denies history of Chronic sinus problems/congestion, Middle ear problems Hematologic/Lymphatic Denies history of Anemia, Hemophilia, Human Immunodeficiency Virus, Lymphedema, Sickle Cell Disease Respiratory Denies history of Aspiration, Asthma, Chronic Obstructive Pulmonary Disease (COPD), Pneumothorax, Sleep Apnea, Tuberculosis Cardiovascular Patient has history of Arrhythmia - a fib, Congestive Heart Failure Denies history of Angina, Coronary Artery Disease, Deep Vein Thrombosis, Hypertension, Hypotension, Myocardial Infarction, Peripheral Arterial Disease, Peripheral Venous Disease, Phlebitis, Vasculitis Gastrointestinal Denies history of Cirrhosis , Colitis, Crohn s, Hepatitis A, Hepatitis B, Hepatitis C Endocrine Denies history of Type I Diabetes, Type II Diabetes Genitourinary Patient has history of End Stage Renal Disease - CKD stage 4 Immunological Denies history of Lupus Erythematosus, Raynaud s, Scleroderma Integumentary (Skin) Denies history of History of Burn, History of pressure wounds Musculoskeletal Denies history of Gout, Rheumatoid Arthritis, Osteoarthritis, Osteomyelitis Neurologic Denies history of Dementia, Neuropathy, Quadriplegia, Paraplegia, Seizure Disorder Oncologic Denies history of Received Chemotherapy, Received Radiation Psychiatric Denies history of Anorexia/bulimia, Confinement Anxiety Medical And Surgical History Notes Cardiovascular pericardial effusion Neurologic CVA Review of Systems (ROS) Constitutional Symptoms (General Health) Denies complaints or symptoms of Fatigue, Fever, Chills, Marked Weight Change. Eyes Complains or has symptoms of Glasses / Contacts - glasses. Ear/Nose/Mouth/Throat Noecker, Andelyn C. ([id number]) Denies complaints or symptoms of Difficult clearing ears, Sinusitis. Hematologic/Lymphatic Denies complaints or symptoms of Bleeding / Clotting Disorders, Human Immunodeficiency  Virus. Respiratory Denies complaints or symptoms of Chronic or frequent coughs, Shortness of Breath. Cardiovascular Denies complaints or symptoms of Chest pain, LE edema. Gastrointestinal Denies complaints or symptoms of Frequent diarrhea, Nausea, Vomiting. Endocrine Denies complaints or symptoms of Hepatitis, Thyroid disease, Polydypsia (Excessive Thirst). Genitourinary Denies complaints or symptoms of Kidney failure/ Dialysis, Incontinence/dribbling. Immunological Denies complaints or symptoms of Hives, Itching. Integumentary (Skin) Complains or has symptoms of Wounds. Denies complaints or symptoms of Bleeding or bruising tendency, Breakdown, Swelling. Musculoskeletal Denies complaints or symptoms of Muscle Pain, Muscle Weakness. Neurologic Denies complaints or symptoms of Numbness/parasthesias, Focal/Weakness. Psychiatric Denies complaints or symptoms of Anxiety, Claustrophobia. Objective  Constitutional Sitting or standing Blood Pressure is within target range for patient.. Pulse regular and within target range for patient.Marland Kitchen Respirations regular, non-labored and within target range.. Temperature is normal and within the target range for the patient.Marland Kitchen appears in no distress. Vitals Time Taken: 8:20 AM, Height: 65 in, Source: Stated, Weight: 110 lbs, Source: Stated, BMI: 18.3, Temperature: 98.2 F, Pulse: 56 bpm, Respiratory Rate: 16 breaths/min, Blood Pressure: 104/43 mmHg. Eyes Conjunctivae clear. No discharge. Cardiovascular Heart sounds soft I did not hear a systolic murmur. She appears to be euvolemic. Femoral pulses palpable. Pedal pulses are palpable on the right. Edema present in both extremities. She has changes of chronic venous insufficiency.. Lymphatic None palpable in the popliteal or inguinal area. Psychiatric Obvious cognitive difficulties. General Notes: Wound exam; the area in question is on the right anterior lower tibia area. Large superficial hematoma.  There Cristina [name], [name] ([id number]) is surrounding hemosiderin deposition in the right leg and changes of chronic venous insufficiency. There is no opening yet to the hematoma but I suspect this will spontaneously open at some point. Integumentary (Hair, Skin) Large amount of subcutaneous hemosiderin on the right anterior leg presumably the result of the same trauma as the hematoma she has.. Wound #2 status is Open. Original cause of wound was Trauma. The wound is located on the Left,Anterior Lower Leg. The wound measures 5cm length x 3.5cm width x 0.1cm depth; 13.744cm^2 area and 1.374cm^3 volume. The wound is limited to skin breakdown. There is no tunneling or undermining noted. There is a none present amount of drainage noted. There is no granulation within the wound bed. There is no necrotic tissue within the wound bed. Assessment Active Problems ICD-10 Contusion of right lower leg, subsequent encounter Chronic venous hypertension (idiopathic) with inflammation of right lower extremity Plan Wound Cleansing: Wound #2 Left,Anterior Lower Leg: Dial antibacterial soap, wash wounds, rinse and pat dry prior to dressing wounds Primary Wound Dressing: Wound #2 Left,Anterior Lower Leg: ABD Pad Dressing Change Frequency: Wound #2 Left,Anterior Lower Leg: Change dressing every week Follow-up Appointments: Wound #2 Left,Anterior Lower Leg: Return Appointment in 1 week. Nurse Visit as needed Edema Control: Wound #2 Left,Anterior Lower Leg: 3 Layer Compression System - Left Lower Extremity - unna to anchor Additional Orders / Instructions: Wound #2 Left,Anterior Lower Leg: Activity as tolerated 1. The patient has a superficial hematoma. There is no doubt in my mind that this is going to open in fact I had some thoughts about evacuating this however I elected to go with conservative management. We put the leg in 3 layer compression. Told him to call us if they notice that the hematoma  has evacuated. Stressed that this is not likely to cause significant bleeding per se. They will need to call us and come in to clean out the area and to rewrap her leg. 2. The patient has chronic venous insufficiency but this was clearly trauma. The patient does not remember what happened Cristina [name], [name] ([id number]) 3. She has a history of a chronic venous wound on the right leg she is not wearing stockings. This is something that may be advisable if she will keep the line after this closes Electronic Signature(s) Signed: 04/17/2019 5:28:46 PM By: Linton Ham MD Entered By: Linton Ham on 04/17/2019 09:03:02 Cristina [name], [name] ([id number]) -------------------------------------------------------------------------------- ROS/PFSH Details Patient Name: Cristina [name], [name] Date of Service: 04/17/2019 8:00 AM Medical Record Number: [id number] Patient Account Number: [id number] Date of Birth/Sex: [birth year] (85 y.o. F) Treating RN: Army Melia  Primary Care Provider: Paulita Cradle Other Clinician: Referring Provider: Paulita Cradle Treating Provider/Extender: Tito Dine in Treatment: 0 Information Obtained From Patient Constitutional Symptoms (General Health) Complaints and Symptoms: Negative for: Fatigue; Fever; Chills; Marked Weight Change Eyes Complaints and Symptoms: Positive for: Glasses / Contacts - glasses Medical History: Positive for: Cataracts - bilateral Negative for: Glaucoma; Optic Neuritis Ear/Nose/Mouth/Throat Complaints and Symptoms: Negative for: Difficult clearing ears; Sinusitis Medical History: Negative for: Chronic sinus problems/congestion; Middle ear problems Hematologic/Lymphatic Complaints and Symptoms: Negative for: Bleeding / Clotting Disorders; Human Immunodeficiency Virus Medical History: Negative for: Anemia; Hemophilia; Human Immunodeficiency Virus; Lymphedema; Sickle Cell Disease Respiratory Complaints and  Symptoms: Negative for: Chronic or frequent coughs; Shortness of Breath Medical History: Negative for: Aspiration; Asthma; Chronic Obstructive Pulmonary Disease (COPD); Pneumothorax; Sleep Apnea; Tuberculosis Cardiovascular Complaints and Symptoms: Negative for: Chest pain; LE edema Medical History: Positive for: Arrhythmia - a fib; Congestive Heart Failure Negative for: Angina; Coronary Artery Disease; Deep Vein Thrombosis; Hypertension; Hypotension; Myocardial Infarction; Peripheral Arterial Disease; Peripheral Venous Disease; Phlebitis; Vasculitis Cristina [name], [name] ([id number]) Past Medical History Notes: pericardial effusion Gastrointestinal Complaints and Symptoms: Negative for: Frequent diarrhea; Nausea; Vomiting Medical History: Negative for: Cirrhosis ; Colitis; Crohnos; Hepatitis A; Hepatitis B; Hepatitis C Endocrine Complaints and Symptoms: Negative for: Hepatitis; Thyroid disease; Polydypsia (Excessive Thirst) Medical History: Negative for: Type I Diabetes; Type II Diabetes Genitourinary Complaints and Symptoms: Negative for: Kidney failure/ Dialysis; Incontinence/dribbling Medical History: Positive for: End Stage Renal Disease - CKD stage 4 Immunological Complaints and Symptoms: Negative for: Hives; Itching Medical History: Negative for: Lupus Erythematosus; Raynaudos; Scleroderma Integumentary (Skin) Complaints and Symptoms: Positive for: Wounds Negative for: Bleeding or bruising tendency; Breakdown; Swelling Medical History: Negative for: History of Burn; History of pressure wounds Musculoskeletal Complaints and Symptoms: Negative for: Muscle Pain; Muscle Weakness Medical History: Negative for: Gout; Rheumatoid Arthritis; Osteoarthritis; Osteomyelitis Neurologic Complaints and Symptoms: Negative for: Numbness/parasthesias; Focal/Weakness Medical History: Negative for: Dementia; Neuropathy; Quadriplegia; Paraplegia; Seizure Disorder Past Medical  History Notes: Cristina [name], [name] ([id number]) CVA Psychiatric Complaints and Symptoms: Negative for: Anxiety; Claustrophobia Medical History: Negative for: Anorexia/bulimia; Confinement Anxiety Oncologic Medical History: Negative for: Received Chemotherapy; Received Radiation HBO Extended History Items Eyes: Cataracts Immunizations Pneumococcal Vaccine: Received Pneumococcal Vaccination: Yes Immunization Notes: up to date Implantable Devices No devices added Family and Social History Cancer: No; Diabetes: No; Heart Disease: Yes - Father; Hereditary Spherocytosis: No; Hypertension: Yes - Father; Kidney Disease: No; Lung Disease: No; Seizures: Yes - Father; Stroke: No; Thyroid Problems: No; Tuberculosis: No; Never smoker; Marital Status - Married; Alcohol Use: Never; Drug Use: No History; Caffeine Use: Daily; Financial Concerns: No; Food, Clothing or Shelter Needs: No; Support System Lacking: No; Transportation Concerns: No Electronic Signature(s) Signed: 04/17/2019 10:38:13 AM By: Army Melia Signed: 04/17/2019 5:28:46 PM By: Linton Ham MD Entered By: Army Melia on 04/17/2019 08:30:10 Knutson, Moira C. ([id number]) -------------------------------------------------------------------------------- SuperBill Details Patient Name: Soh, Thomasenia C. Date of Service: 04/17/2019 Medical Record Number: [id number] Patient Account Number: [id number] Date of Birth/Sex: 1923/09/10 (85 y.o. F) Treating RN: Cornell Barman Primary Care Provider: Paulita Cradle Other Clinician: Referring Provider: Paulita Cradle Treating Provider/Extender: Tito Dine in Treatment: 0 Diagnosis Coding ICD-10 Codes Code Description S80.11XD Contusion of right lower leg, subsequent encounter I87.321 Chronic venous hypertension (idiopathic) with inflammation of right lower extremity Facility Procedures CPT4 Code: [id number] Description: [phone] - WOUND CARE VISIT-LEV 2 EST  PT Modifier: Quantity: 1 CPT4 Code: [id number] Description: (Facility Use Only) [place]  Modifier: Quantity: 1 Physician Procedures CPT4 Code Description: [id number] - WC PHYS LEVEL 4 - EST PT ICD-10 Diagnosis Description S80.11XD Contusion of right lower leg, subsequent encounter I87.321 Chronic venous hypertension (idiopathic) with inflammation of Modifier: right lower extr Quantity: 1 emity Electronic Signature(s) Signed: 04/17/2019 5:28:46 PM By: Linton Ham MD Entered By: Linton Ham on 04/17/2019 09:03:31

## 2019-04-24 ENCOUNTER — Other Ambulatory Visit: Payer: Self-pay

## 2019-04-24 DIAGNOSIS — S8011XD Contusion of right lower leg, subsequent encounter: Secondary | ICD-10-CM | POA: Diagnosis not present

## 2019-04-25 NOTE — Progress Notes (Signed)
Koppen, Mayukha C. ([id number]) Visit Report for 04/24/2019 Arrival Information Details Patient Name: Cristina [name], [name] Date of Service: 04/24/2019 1:00 PM Medical Record Number: [id number] Patient Account Number: [id number] Date of Birth/Sex: 07-29-1924 (85 y.o. F) Treating RN: Cornell Barman Primary Care Cerrone Debold: Paulita Cradle Other Clinician: Referring Kandace Elrod: Paulita Cradle Treating Quamesha Mullet/Extender: Tito Dine in Treatment: 1 Visit Information History Since Last Visit Added or deleted any medications: No Patient Arrived: Cane Any new allergies or adverse reactions: No Arrival Time: 13:15 Had a fall or experienced change in No Accompanied By: daughter activities of daily living that may affect Transfer Assistance: Manual risk of falls: Patient Identification Verified: Yes Signs or symptoms of abuse/neglect since last visito No Secondary Verification Process Yes Hospitalized since last visit: No Completed: Implantable device outside of the clinic excluding No Patient Has Alerts: Yes cellular tissue based products placed in the center Patient Alerts: Patient on Blood since last visit: Thinner Has Dressing in Place as Prescribed: Yes Pt taking plavix Has Compression in Place as Prescribed: Yes Pain Present Now: No Electronic Signature(s) Signed: 04/25/2019 11:15:51 AM By: Gretta Cool, BSN, RN, CWS, Kim RN, BSN Entered By: Gretta Cool, BSN, RN, CWS, Kim on 04/24/2019 13:16:02 Cristina [name], [name] ([id number]) -------------------------------------------------------------------------------- Compression Therapy Details Patient Name: Cristina [name], [name] Date of Service: 04/24/2019 1:00 PM Medical Record Number: [id number] Patient Account Number: [id number] Date of Birth/Sex: 27-Mar-1924 (85 y.o. F) Treating RN: Cornell Barman Primary Care Marshal Eskew: Paulita Cradle Other Clinician: Referring Braven Wolk: Paulita Cradle Treating Tyde Lamison/Extender: Tito Dine in  Treatment: 1 Compression Therapy Performed for Wound Assessment: Wound #2 Left,Anterior Lower Leg Performed By: Clinician Cornell Barman, RN Compression Type: Three Hydrologist) Signed: 04/25/2019 11:15:51 AM By: Gretta Cool, BSN, RN, CWS, Kim RN, BSN Entered By: Gretta Cool, BSN, RN, CWS, Kim on 04/24/2019 13:32:41 Cristina [name], Cristina [name] ([id number]) -------------------------------------------------------------------------------- Encounter Discharge Information Details Patient Name: Cristina [name], [name] Date of Service: 04/24/2019 1:00 PM Medical Record Number: [id number] Patient Account Number: [id number] Date of Birth/Sex: Oct 21, 1923 (85 y.o. F) Treating RN: Cornell Barman Primary Care Ronnae Kaser: Paulita Cradle Other Clinician: Referring Tona Qualley: Paulita Cradle Treating Yahye Siebert/Extender: Tito Dine in Treatment: 1 Encounter Discharge Information Items Discharge Condition: Stable Ambulatory Status: Cane Discharge Destination: Home Transportation: Private Auto Accompanied By: self Schedule Follow-up Appointment: Yes Clinical Summary of Care: Electronic Signature(s) Signed: 04/25/2019 11:15:51 AM By: Gretta Cool, BSN, RN, CWS, Kim RN, BSN Entered By: Gretta Cool, BSN, RN, CWS, Kim on 04/24/2019 13:33:32 Cristina [name], [name] ([id number]) -------------------------------------------------------------------------------- Wound Assessment Details Patient Name: Cristina [name], [name] Date of Service: 04/24/2019 1:00 PM Medical Record Number: [id number] Patient Account Number: [id number] Date of Birth/Sex: 07-Mar-1924 (85 y.o. F) Treating RN: Cornell Barman Primary Care Theordore Cisnero: Paulita Cradle Other Clinician: Referring Mouna Yager: Paulita Cradle Treating Zaylan Kissoon/Extender: Tito Dine in Treatment: 1 Wound Status Wound Number: 2 Primary Etiology: Trauma, Other Wound Location: Left, Anterior Lower Leg Wound Status: Open Wounding Event: Trauma Date Acquired: 04/14/2019 Weeks  Of Treatment: 1 Clustered Wound: No Wound Measurements Length: (cm) 5 Width: (cm) 3.5 Depth: (cm) 0.1 Area: (cm) 13.744 Volume: (cm) 1.374 % Reduction in Area: 0% % Reduction in Volume: 0% Wound Description Classification: Unclassifiable Treatment Notes Wound #2 (Left, Anterior Lower Leg) Notes ABD, 3-Layer Left, unna to anchor Electronic Signature(s) Signed: 04/25/2019 11:15:51 AM By: Gretta Cool, BSN, RN, CWS, Kim RN, BSN Entered By: Gretta Cool, BSN, RN, CWS, Kim on 04/24/2019 13:31:34

## 2019-05-01 ENCOUNTER — Other Ambulatory Visit: Payer: Self-pay

## 2019-05-01 ENCOUNTER — Encounter: Payer: Medicare Other | Admitting: Internal Medicine

## 2019-05-01 DIAGNOSIS — S8011XD Contusion of right lower leg, subsequent encounter: Secondary | ICD-10-CM | POA: Diagnosis not present

## 2019-05-01 NOTE — Progress Notes (Signed)
[name], [name] ([id number]) Visit Report for 05/01/2019 Arrival Information Details Patient Name: Cristina [name], [name] Date of Service: 05/01/2019 1:30 PM Medical Record Number: [id number] Patient Account Number: [id number] Date of Birth/Sex: 1923-11-04 (85 y.o. F) Treating RN: Cornell Barman Primary Care Kdyn Vonbehren: Paulita Cradle Other Clinician: Referring Euna Armon: Paulita Cradle Treating Xochilth Standish/Extender: Tito Dine in Treatment: 2 Visit Information History Since Last Visit Added or deleted any medications: No Patient Arrived: Walker Any new allergies or adverse reactions: No Arrival Time: 13:47 Had a fall or experienced change in No Accompanied By: daughter activities of daily living that may affect Transfer Assistance: None risk of falls: Patient Identification Verified: Yes Signs or symptoms of abuse/neglect since last visito No Secondary Verification Process Yes Hospitalized since last visit: No Completed: Has Dressing in Place as Prescribed: Yes Patient Has Alerts: Yes Pain Present Now: No Patient Alerts: Patient on Blood Thinner Pt taking plavix Electronic Signature(s) Signed: 05/01/2019 2:23:38 PM By: Lorine Bears RCP, RRT, CHT Entered By: Becky Sax, Amado Nash on 05/01/2019 13:48:02 Grafton, Koryn C. ([id number]) -------------------------------------------------------------------------------- Encounter Discharge Information Details Patient Name: [name], [name] Date of Service: 05/01/2019 1:30 PM Medical Record Number: [id number] Patient Account Number: [id number] Date of Birth/Sex: [birth year] (85 y.o. F) Treating RN: Cornell Barman Primary Care Yehonatan Grandison: Paulita Cradle Other Clinician: Referring Daanish Copes: Paulita Cradle Treating Makana Rostad/Extender: Tito Dine in Treatment: 2 Encounter Discharge Information Items Post Procedure Vitals Discharge Condition: Stable Temperature (F): 98.6 Ambulatory  Status: Walker Pulse (bpm): 62 Discharge Destination: Home Respiratory Rate (breaths/min): 16 Transportation: Private Auto Blood Pressure (mmHg): 117/54 Accompanied By: daughter Schedule Follow-up Appointment: Yes Clinical Summary of Care: Electronic Signature(s) Signed: 05/01/2019 5:22:27 PM By: Gretta Cool, BSN, RN, CWS, Kim RN, BSN Entered By: Gretta Cool, BSN, RN, CWS, Kim on 05/01/2019 14:31:12 Brunty, Ariel C. ([id number]) -------------------------------------------------------------------------------- Lower Extremity Assessment Details Patient Name: [name], [name] Date of Service: 05/01/2019 1:30 PM Medical Record Number: [id number] Patient Account Number: [id number] Date of Birth/Sex: 06/29/1924 (85 y.o. F) Treating RN: Montey Hora Primary Care Georgeann Brinkman: Paulita Cradle Other Clinician: Referring Deshawna Mcneece: Paulita Cradle Treating Tyse Auriemma/Extender: Tito Dine in Treatment: 2 Edema Assessment Assessed: [Left: No] [Right: No] Edema: [Left: N] [Right: o] Calf Left: Right: Point of Measurement: 32 cm From Medial Instep 31.3 cm cm Ankle Left: Right: Point of Measurement: 10 cm From Medial Instep 19 cm cm Vascular Assessment Pulses: Dorsalis Pedis Palpable: [Left:Yes] Electronic Signature(s) Signed: 05/01/2019 4:36:54 PM By: Montey Hora Entered By: Montey Hora on 05/01/2019 14:03:15 Anwar, Shamia C. ([id number]) -------------------------------------------------------------------------------- Multi Wound Chart Details Patient Name: [name], [name] Date of Service: 05/01/2019 1:30 PM Medical Record Number: [id number] Patient Account Number: [id number] Date of Birth/Sex: 06-24-1924 (85 y.o. F) Treating RN: Cornell Barman Primary Care Yomaris Palecek: Paulita Cradle Other Clinician: Referring Stephannie Broner: Paulita Cradle Treating Baylea Milburn/Extender: Tito Dine in Treatment: 2 Vital Signs Height(in): 65 Pulse(bpm): 36 Weight(lbs):  110 Blood Pressure(mmHg): 117/54 Body Mass Index(BMI): 18 Temperature(F): 98.6 Respiratory Rate 16 (breaths/min): Photos: [N/A:N/A] Wound Location: Left Lower Leg - Anterior N/A N/A Wounding Event: Trauma N/A N/A Primary Etiology: Trauma, Other N/A N/A Comorbid History: Cataracts, Arrhythmia, N/A N/A Congestive Heart Failure, End Stage Renal Disease Date Acquired: 04/14/2019 N/A N/A Weeks of Treatment: 2 N/A N/A Wound Status: Open N/A N/A Measurements L x W x D 5.8x4x0.1 N/A N/A (cm) Area (cm) : 18.221 N/A N/A Volume (cm) : 1.822 N/A N/A % Reduction in Area: -32.60% N/A N/A % Reduction in Volume: -32.60% N/A N/A Classification: Unclassifiable N/A N/A  Exudate Amount: Small N/A N/A Exudate Type: Serosanguineous N/A N/A Exudate Color: red, brown N/A N/A Wound Margin: Indistinct, nonvisible N/A N/A Granulation Amount: None Present (0%) N/A N/A Necrotic Amount: None Present (0%) N/A N/A Exposed Structures: Fascia: No N/A N/A Fat Layer (Subcutaneous Tissue) Exposed: No Tendon: No Muscle: No Joint: No [name], [name] ([id number]) Bone: No Limited to Skin Breakdown Epithelialization: None N/A N/A Debridement: Debridement - Excisional N/A N/A Pre-procedure 14:25 N/A N/A Verification/Time Out Taken: Pain Control: Lidocaine N/A N/A Tissue Debrided: Blood Clots, Subcutaneous N/A N/A Level: Skin/Subcutaneous Tissue N/A N/A Debridement Area (sq cm): 23.2 N/A N/A Instrument: Curette, Scissors N/A N/A Bleeding: Moderate N/A N/A Hemostasis Achieved: Pressure N/A N/A Debridement Treatment Procedure was tolerated well N/A N/A Response: Post Debridement 5.8x4x0.2 N/A N/A Measurements L x W x D (cm) Post Debridement Volume: 3.644 N/A N/A (cm) Assessment Notes: patient with intact blood filled N/A N/A blister Procedures Performed: Debridement N/A N/A Treatment Notes Wound #2 (Left, Anterior Lower Leg) Notes Silver cell, ABD, 3-Layer Left, unna to anchor Electronic  Signature(s) Signed: 05/01/2019 5:11:23 PM By: Linton Ham MD Entered By: Linton Ham on 05/01/2019 15:19:17 [name], [name] ([id number]) -------------------------------------------------------------------------------- Multi-Disciplinary Care Plan Details Patient Name: [name], [name] Date of Service: 05/01/2019 1:30 PM Medical Record Number: [id number] Patient Account Number: [id number] Date of Birth/Sex: 02-Dec-1923 (85 y.o. F) Treating RN: Cornell Barman Primary Care Lameisha Schuenemann: Paulita Cradle Other Clinician: Referring Rylie Knierim: Paulita Cradle Treating Theordore Cisnero/Extender: Tito Dine in Treatment: 2 Active Inactive Abuse / Safety / Falls / Self Care Management Nursing Diagnoses: Potential for falls Goals: Patient will remain injury free related to falls Date Initiated: 04/17/2019 Target Resolution Date: 05/24/2019 Goal Status: Active Interventions: Assess fall risk on admission and as needed Notes: Orientation to the Wound Care Program Nursing Diagnoses: Knowledge deficit related to the wound healing center program Goals: Patient/caregiver will verbalize understanding of the [facility] Date Initiated: 04/17/2019 Target Resolution Date: 05/24/2019 Goal Status: Active Interventions: Provide education on orientation to the wound center Notes: Wound/Skin Impairment Nursing Diagnoses: Impaired tissue integrity Goals: Ulcer/skin breakdown will have a volume reduction of 30% by week 4 Date Initiated: 04/17/2019 Target Resolution Date: 05/17/2019 Goal Status: Active Interventions: Assess ulceration(s) every visit Rudnicki, Karima C. ([id number]) Treatment Activities: Skin care regimen initiated : 04/17/2019 Notes: Electronic Signature(s) Signed: 05/01/2019 5:22:27 PM By: Gretta Cool, BSN, RN, CWS, Kim RN, BSN Entered By: Gretta Cool, BSN, RN, CWS, Kim on 05/01/2019 14:27:04 Guiles, Zanayah C.  ([id number]) -------------------------------------------------------------------------------- Pain Assessment Details Patient Name: Kolton, Imanii C. Date of Service: 05/01/2019 1:30 PM Medical Record Number: [id number] Patient Account Number: [id number] Date of Birth/Sex: 03/14/1924 (85 y.o. F) Treating RN: Cornell Barman Primary Care Blaire Palomino: Paulita Cradle Other Clinician: Referring Aizah Gehlhausen: Paulita Cradle Treating Gianna Calef/Extender: Tito Dine in Treatment: 2 Active Problems Location of Pain Severity and Description of Pain Patient Has Paino No Site Locations Pain Management and Medication Current Pain Management: Electronic Signature(s) Signed: 05/01/2019 2:23:38 PM By: Lorine Bears RCP, RRT, CHT Signed: 05/01/2019 5:22:27 PM By: Gretta Cool, BSN, RN, CWS, Kim RN, BSN Entered By: Lorine Bears on 05/01/2019 13:48:09 Thurman, Shameria C. ([id number]) -------------------------------------------------------------------------------- Patient/Caregiver Education Details Patient Name: Eckerman, Gitel C. Date of Service: 05/01/2019 1:30 PM Medical Record Number: [id number] Patient Account Number: [id number] Date of Birth/Gender: 1924/08/11 (85 y.o. F) Treating RN: Cornell Barman Primary Care Physician: Paulita Cradle Other Clinician: Referring Physician: Paulita Cradle Treating Physician/Extender: Tito Dine in Treatment: 2 Education Assessment Education Provided To: Patient  Education Topics Provided Wound/Skin Impairment: Handouts: Caring for Your Ulcer Methods: Demonstration, Explain/Verbal Responses: State content correctly Electronic Signature(s) Signed: 05/01/2019 5:22:27 PM By: Gretta Cool, BSN, RN, CWS, Kim RN, BSN Entered By: Gretta Cool, BSN, RN, CWS, Kim on 05/01/2019 14:30:08 Chrostowski, Madolyn C. ([id number]) -------------------------------------------------------------------------------- Wound Assessment Details Patient  Name: Stachnik, Jashawna C. Date of Service: 05/01/2019 1:30 PM Medical Record Number: [id number] Patient Account Number: [id number] Date of Birth/Sex: [birth year] (85 y.o. F) Treating RN: Montey Hora Primary Care Halen Antenucci: Paulita Cradle Other Clinician: Referring Donnice Nielsen: Paulita Cradle Treating Tashima Scarpulla/Extender: Tito Dine in Treatment: 2 Wound Status Wound Number: 2 Primary Trauma, Other Etiology: Wound Location: Left Lower Leg - Anterior Wound Open Wounding Event: Trauma Status: Date Acquired: 04/14/2019 Comorbid Cataracts, Arrhythmia, Congestive Heart Weeks Of Treatment: 2 History: Failure, End Stage Renal Disease Clustered Wound: No Photos Wound Measurements Length: (cm) 5.8 Width: (cm) 4 Depth: (cm) 0.1 Area: (cm) 18.221 Volume: (cm) 1.822 % Reduction in Area: -32.6% % Reduction in Volume: -32.6% Epithelialization: None Tunneling: No Undermining: No Wound Description Classification: Unclassifiable Foul Odor Wound Margin: Indistinct, nonvisible Slough/Fib Exudate Amount: Small Exudate Type: Serosanguineous Exudate Color: red, brown After Cleansing: No rino No Wound Bed Granulation Amount: None Present (0%) Exposed Structure Necrotic Amount: None Present (0%) Fascia Exposed: No Fat Layer (Subcutaneous Tissue) Exposed: No Tendon Exposed: No Muscle Exposed: No Joint Exposed: No Bone Exposed: No Limited to Skin Breakdown Assessment Notes Mallozzi, Zoriana C. ([id number]) patient with intact blood filled blister Treatment Notes Wound #2 (Left, Anterior Lower Leg) Notes Silver cell, ABD, 3-Layer Left, unna to anchor Electronic Signature(s) Signed: 05/01/2019 4:36:54 PM By: Montey Hora Entered By: Montey Hora on 05/01/2019 14:02:14 Aquilino, Cera C. ([id number]) -------------------------------------------------------------------------------- Vitals Details Patient Name: Herrington, Nerissa C. Date of Service: 05/01/2019 1:30  PM Medical Record Number: [id number] Patient Account Number: [id number] Date of Birth/Sex: [birth year] (85 y.o. F) Treating RN: Cornell Barman Primary Care Krystyn Picking: Paulita Cradle Other Clinician: Referring Aksel Bencomo: Paulita Cradle Treating Demetries Coia/Extender: Tito Dine in Treatment: 2 Vital Signs Time Taken: 13:48 Temperature (F): 98.6 Height (in): 65 Pulse (bpm): 62 Weight (lbs): 110 Respiratory Rate (breaths/min): 16 Body Mass Index (BMI): 18.3 Blood Pressure (mmHg): 117/54 Reference Range: 80 - 120 mg / dl Electronic Signature(s) Signed: 05/01/2019 2:23:38 PM By: Lorine Bears RCP, RRT, CHT Entered By: Lorine Bears on 05/01/2019 13:51:38

## 2019-05-01 NOTE — Progress Notes (Addendum)
Sawchuk, Bradley C. ([id number]) Visit Report for 05/01/2019 Debridement Details Patient Name: Cristina [name], [name] Date of Service: 05/01/2019 1:30 PM Medical Record Number: [id number] Patient Account Number: [id number] Date of Birth/Sex: March 09, 1924 (85 y.o. F) Treating RN: Cornell Barman Primary Care Provider: Paulita Cradle Other Clinician: Referring Provider: Paulita Cradle Treating Provider/Extender: Tito Dine in Treatment: 2 Debridement Performed for Wound #2 Left,Anterior Lower Leg Assessment: Performed By: Physician Ricard Dillon, MD Debridement Type: Debridement Level of Consciousness (Pre- Awake and Alert procedure): Pre-procedure Verification/Time Yes - 14:25 Out Taken: Start Time: 14:25 Pain Control: Lidocaine Total Area Debrided (L x W): 5.8 (cm) x 4 (cm) = 23.2 (cm) Tissue and other material Viable, Non-Viable, Blood Clots, Subcutaneous, Skin: Dermis debrided: Level: Skin/Subcutaneous Tissue Debridement Description: Excisional Instrument: Curette, Scissors Bleeding: Moderate Hemostasis Achieved: Pressure End Time: 14:28 Response to Treatment: Procedure was tolerated well Level of Consciousness Awake and Alert (Post-procedure): Post Debridement Measurements of Total Wound Length: (cm) 5.8 Width: (cm) 4 Depth: (cm) 0.2 Volume: (cm) 3.644 Character of Wound/Ulcer Post Debridement: Stable Post Procedure Diagnosis Same as Pre-procedure Electronic Signature(s) Signed: 05/01/2019 5:11:23 PM By: Linton Ham MD Signed: 05/01/2019 5:22:27 PM By: Gretta Cool, BSN, RN, CWS, Kim RN, BSN Entered By: Linton Ham on 05/01/2019 15:19:42 Hribar, [name] ([id number]) -------------------------------------------------------------------------------- HPI Details Patient Name: Cristina [name], [name] Date of Service: 05/01/2019 1:30 PM Medical Record Number: [id number] Patient Account Number: [id number] Date of Birth/Sex: 1924/08/09 (85 y.o. F) Treating  RN: Cornell Barman Primary Care Provider: Paulita Cradle Other Clinician: Referring Provider: Paulita Cradle Treating Provider/Extender: Tito Dine in Treatment: 2 History of Present Illness HPI Description: 08/06/18 on evaluation today patient actually presents for initial evaluation concerning a right anterior lower extremity ulcer which has been present for 4-5 months. She is a very poor historian really does not know if she tripped or how in fact she fell and injured this area. Upon further questioning it appears she actually has dimension I Cristina [name] explain a scenario such as how to perform the dressing changes and then she Cristina [name] ask me questions such as how often it should be done when I very specifically pointed this out to her. I definitely think that there is a strong component of dementia here leading to her poor history. She does have a history of atrial fibrillation as Cristina [name] a stroke and is on blood thinners chronically for this. She also has stage IV chronic kidney disease. Patient's wound over the anterior lower extremity of the right leg actually show signs of being completely eschar covered upon my initial evaluation today. No fevers, chills, nausea, or vomiting noted at this time. 08/20/18 on evaluation today patient's wound actually appears to be doing significantly better which is excellent news. She has been having her daughter change the dressing's which I think has worked out great for her. With that being said we had previously ordered home health although they never actually got started simply due to the fact that I was concerned about the patient's dimension of her being able to actually perform the dressing changes appropriately of her own accord. Nonetheless with her daughter taking care of this for her I see no issues and they seem to be doing excellent at this point. 09/03/18 on evaluation today patient's wound actually appears to be doing much better which is  good news. With that being said she did initially have a lot of dry drainage on the surface of the wound. This is subsequently due to the fact that  on Friday patient's daughter [name] she Cristina [name] move the dressing to clean the area and then forgot to reapply the dressing before she left her mother's home. She subsequently called her father to get him to apply it but when she picked her mother up today to bring her to the appointment this still did not have a dressing on it. Nonetheless things do not appear to be doing too poorly which is good news overall I do feel like she's showing signs of improvement. 09/10/18 on evaluation today patient's wound actually appears to be completely healed which is excellent news. Overall she's been tolerating the dressing changes without complication. READMISSION 04/17/2019 This is a 85 year old woman who has a history of a wound on her right anterior lower extremity of uncertain etiology in late 2019 early 2020. She was seen in this clinic and healed out with standard care. She has chronic venous insufficiency. She does not wear stockings. She is not a diabetic. Noticed by her husband to have a fairly large hematoma on the left anterior lower leg. They are here for our review of this. She is on Plavix and aspirin presumably for prophylaxis and atrial fibrillation. They have not been doing anything to the wound. The patient has a history of atrial fibrillation on Plavix and aspirin, dementia for which she has not had an evaluation history of TIA/CVA, stage IV chronic renal failure, aortic stenosis. We did not do ABIs because of location of the area. 9/16; the patient came in with the hematoma still largely intact which was surprising however with gentle palpation it was also noted to be leaking inferiorly. Using pickups and scissors I remove this denuded skin on the top. Gauze and saline to remove the gelatinous hematoma and finally debrided the wound surface. We Cristina [name]  use silver alginate Electronic Signature(s) Signed: 05/02/2019 5:04:26 PM By: Gretta Cool, BSN, RN, CWS, Kim RN, BSN Signed: 05/02/2019 5:10:26 PM By: Linton Ham MD ANNELIESE, GOKHALE ([id number]) Previous Signature: 05/01/2019 5:11:23 PM Version By: Linton Ham MD Entered By: Gretta Cool, BSN, RN, CWS, Kim on 05/02/2019 17:04:26 Dysart, Rigoberto Noel ([id number]) -------------------------------------------------------------------------------- Physical Exam Details Patient Name: Cristina [name], [name] Date of Service: 05/01/2019 1:30 PM Medical Record Number: [id number] Patient Account Number: [id number] Date of Birth/Sex: March 15, 1924 (85 y.o. F) Treating RN: Cornell Barman Primary Care Provider: Paulita Cradle Other Clinician: Referring Provider: Paulita Cradle Treating Provider/Extender: Ricard Dillon Weeks in Treatment: 2 Constitutional Sitting or standing Blood Pressure is within target range for patient.. Pulse regular and within target range for patient.Marland Kitchen Respirations regular, non-labored and within target range.. Temperature is normal and within the target range for the patient.Marland Kitchen appears in no distress. Notes Wound exam; area in question on the right anterior lower tibia. Large superficial hematoma that was leaking inferiorly. Using pickups and scissors the surface epithelium was removed. The gelatinous hematoma removed with saline and gauze. Finally the wound bed was debrided of adherent necrotic debris with a #5 curette. No evidence of surrounding infection Electronic Signature(s) Signed: 05/01/2019 5:11:23 PM By: Linton Ham MD Entered By: Linton Ham on 05/01/2019 15:21:53 Cristina [name], [name] ([id number]) -------------------------------------------------------------------------------- Physician Orders Details Patient Name: Wuebker, Margurete C. Date of Service: 05/01/2019 1:30 PM Medical Record Number: [id number] Patient Account Number: [id number] Date of Birth/Sex: Aug 29, 1923  (85 y.o. F) Treating RN: Cornell Barman Primary Care Provider: Paulita Cradle Other Clinician: Referring Provider: Paulita Cradle Treating Provider/Extender: Tito Dine in Treatment: 2 Verbal / Phone Orders: No Diagnosis Coding Wound Cleansing Wound #2 Left,Anterior Lower  Leg o Dial antibacterial soap, wash wounds, rinse and pat dry prior to dressing wounds Primary Wound Dressing Wound #2 Left,Anterior Lower Leg o Silver Alginate Secondary Dressing Wound #2 Left,Anterior Lower Leg o ABD pad Dressing Change Frequency Wound #2 Left,Anterior Lower Leg o Change dressing every week Follow-up Appointments Wound #2 Left,Anterior Lower Leg o Return Appointment in 1 week. o Nurse Visit as needed Edema Control Wound #2 Left,Anterior Lower Leg o 3 Layer Compression System - Left Lower Extremity - unna to anchor Additional Orders / Instructions Wound #2 Left,Anterior Lower Leg o Activity as tolerated Electronic Signature(s) Signed: 05/01/2019 5:11:23 PM By: Linton Ham MD Signed: 05/01/2019 5:22:27 PM By: Gretta Cool, BSN, RN, CWS, Kim RN, BSN Entered By: Gretta Cool, BSN, RN, CWS, Kim on 05/01/2019 14:29:36 Stfleur, Avri C. ([id number]) -------------------------------------------------------------------------------- Problem List Details Patient Name: Fierro, Soley C. Date of Service: 05/01/2019 1:30 PM Medical Record Number: [id number] Patient Account Number: [id number] Date of Birth/Sex: [birth year] (85 y.o. F) Treating RN: Cornell Barman Primary Care Provider: Paulita Cradle Other Clinician: Referring Provider: Paulita Cradle Treating Provider/Extender: Tito Dine in Treatment: 2 Active Problems ICD-10 Evaluated Encounter Code Description Active Date Today Diagnosis S80.11XD Contusion of right lower leg, subsequent encounter 04/17/2019 No Yes I87.321 Chronic venous hypertension (idiopathic) with inflammation of 04/17/2019 No Yes right  lower extremity Inactive Problems Resolved Problems Electronic Signature(s) Signed: 05/01/2019 5:11:23 PM By: Linton Ham MD Entered By: Linton Ham on 05/01/2019 15:18:26 Slivka, Lelaina C. ([id number]) -------------------------------------------------------------------------------- Progress Note Details Patient Name: Cristina [name], [name] Date of Service: 05/01/2019 1:30 PM Medical Record Number: [id number] Patient Account Number: [id number] Date of Birth/Sex: [birth year] (85 y.o. F) Treating RN: Cornell Barman Primary Care Provider: Paulita Cradle Other Clinician: Referring Provider: Paulita Cradle Treating Provider/Extender: Tito Dine in Treatment: 2 Subjective History of Present Illness (HPI) 08/06/18 on evaluation today patient actually presents for initial evaluation concerning a right anterior lower extremity ulcer which has been present for 4-5 months. She is a very poor historian really does not know if she tripped or how in fact she fell and injured this area. Upon further questioning it appears she actually has dimension I Cristina [name] explain a scenario such as how to perform the dressing changes and then she Cristina [name] ask me questions such as how often it should be done when I very specifically pointed this out to her. I definitely think that there is a strong component of dementia here leading to her poor history. She does have a history of atrial fibrillation as Cristina [name] a stroke and is on blood thinners chronically for this. She also has stage IV chronic kidney disease. Patient's wound over the anterior lower extremity of the right leg actually show signs of being completely eschar covered upon my initial evaluation today. No fevers, chills, nausea, or vomiting noted at this time. 08/20/18 on evaluation today patient's wound actually appears to be doing significantly better which is excellent news. She has been having her daughter change the dressing's which I think has  worked out great for her. With that being said we had previously ordered home health although they never actually got started simply due to the fact that I was concerned about the patient's dimension of her being able to actually perform the dressing changes appropriately of her own accord. Nonetheless with her daughter taking care of this for her I see no issues and they seem to be doing excellent at this point. 09/03/18 on evaluation today patient's wound actually appears to be doing much  better which is good news. With that being said she did initially have a lot of dry drainage on the surface of the wound. This is subsequently due to the fact that on Friday patient's daughter [name] she Cristina [name] move the dressing to clean the area and then forgot to reapply the dressing before she left her mother's home. She subsequently called her father to get him to apply it but when she picked her mother up today to bring her to the appointment this still did not have a dressing on it. Nonetheless things do not appear to be doing too poorly which is good news overall I do feel like she's showing signs of improvement. 09/10/18 on evaluation today patient's wound actually appears to be completely healed which is excellent news. Overall she's been tolerating the dressing changes without complication. READMISSION 04/17/2019 This is a 85 year old woman who has a history of a wound on her left anterior lower extremity of uncertain etiology in late 2019 early 2020. She was seen in this clinic and healed out with standard care. She has chronic venous insufficiency. She does not wear stockings. She is not a diabetic. Noticed by her husband to have a fairly large hematoma on the left anterior lower leg. They are here for our review of this. She is on Plavix and aspirin presumably for prophylaxis and atrial fibrillation. They have not been doing anything to the wound. The patient has a history of atrial fibrillation on Plavix  and aspirin, dementia for which she has not had an evaluation history of TIA/CVA, stage IV chronic renal failure, aortic stenosis. We did not do ABIs because of location of the area. 9/16; the patient came in with the hematoma still largely intact which was surprising however with gentle palpation it was also noted to be leaking inferiorly. Using pickups and scissors I remove this denuded skin on the top. Gauze and saline to remove the gelatinous hematoma and finally debrided the wound surface. We Cristina [name] use silver alginate Cristina [name], [name] ([id number]) Objective Constitutional Sitting or standing Blood Pressure is within target range for patient.. Pulse regular and within target range for patient.Marland Kitchen Respirations regular, non-labored and within target range.. Temperature is normal and within the target range for the patient.Marland Kitchen appears in no distress. Vitals Time Taken: 1:48 PM, Height: 65 in, Weight: 110 lbs, BMI: 18.3, Temperature: 98.6 F, Pulse: 62 bpm, Respiratory Rate: 16 breaths/min, Blood Pressure: 117/54 mmHg. General Notes: Wound exam; area in question on the left anterior lower tibia. Large superficial hematoma that was leaking inferiorly. Using pickups and scissors the surface epithelium was removed. The gelatinous hematoma removed with saline and gauze. Finally the wound bed was debrided of adherent necrotic debris with a #5 curette. No evidence of surrounding infection Integumentary (Hair, Skin) Wound #2 status is Open. Original cause of wound was Trauma. The wound is located on the Left,Anterior Lower Leg. The wound measures 5.8cm length x 4cm width x 0.1cm depth; 18.221cm^2 area and 1.822cm^3 volume. The wound is limited to skin breakdown. There is no tunneling or undermining noted. There is a small amount of serosanguineous drainage noted. The wound margin is indistinct and nonvisible. There is no granulation within the wound bed. There is no necrotic tissue within the wound  bed. General Notes: patient with intact blood filled blister Assessment Active Problems ICD-10 Contusion of right lower leg, subsequent encounter Chronic venous hypertension (idiopathic) with inflammation of right lower extremity Procedures Wound #2 Pre-procedure diagnosis of Wound #2 is a Trauma, Other  located on the Left,Anterior Lower Leg . There was a Excisional Skin/Subcutaneous Tissue Debridement with a total area of 23.2 sq cm performed by Ricard Dillon, MD. With the following instrument(s): Curette, and Scissors to remove Viable and Non-Viable tissue/material. Material removed includes Blood Clots, Subcutaneous Tissue, and Skin: Dermis after achieving pain control using Lidocaine. No specimens were taken. A time out was conducted at 14:25, prior to the start of the procedure. A Moderate amount of bleeding was controlled with Pressure. The procedure was tolerated well. Post Debridement Measurements: 5.8cm length x 4cm width x 0.2cm depth; 3.644cm^3 volume. Character of Wound/Ulcer Post Debridement is stable. Post procedure Diagnosis Wound #2: Same as Pre-Procedure Cristina [name], [name] ([id number]) Plan Wound Cleansing: Wound #2 Left,Anterior Lower Leg: Dial antibacterial soap, wash wounds, rinse and pat dry prior to dressing wounds Primary Wound Dressing: Wound #2 Left,Anterior Lower Leg: Silver Alginate Secondary Dressing: Wound #2 Left,Anterior Lower Leg: ABD pad Dressing Change Frequency: Wound #2 Left,Anterior Lower Leg: Change dressing every week Follow-up Appointments: Wound #2 Left,Anterior Lower Leg: Return Appointment in 1 week. Nurse Visit as needed Edema Control: Wound #2 Left,Anterior Lower Leg: 3 Layer Compression System - Left Lower Extremity - unna to anchor Additional Orders / Instructions: Wound #2 Left,Anterior Lower Leg: Activity as tolerated 1. Left anterior lower leg. We applied silver alginate ABD/3 layer compression. 2. May need further  debridement and/or change in dressing next week depending on the wound surface Electronic Signature(s) Signed: 05/02/2019 5:03:05 PM By: Gretta Cool, BSN, RN, CWS, Kim RN, BSN Signed: 05/02/2019 5:10:26 PM By: Linton Ham MD Previous Signature: 05/01/2019 5:11:23 PM Version By: Linton Ham MD Entered By: Gretta Cool, BSN, RN, CWS, Kim on 05/02/2019 17:03:05 Cristina [name], [name] ([id number]) -------------------------------------------------------------------------------- [place] Details Patient Name: Cristina [name], [name] Date of Service: 05/01/2019 Medical Record Number: [id number] Patient Account Number: [id number] Date of Birth/Sex: 1923-11-07 (85 y.o. F) Treating RN: Cornell Barman Primary Care Provider: Paulita Cradle Other Clinician: Referring Provider: Paulita Cradle Treating Provider/Extender: Tito Dine in Treatment: 2 Diagnosis Coding ICD-10 Codes Code Description S80.11XD Contusion of right lower leg, subsequent encounter I87.321 Chronic venous hypertension (idiopathic) with inflammation of right lower extremity Facility Procedures CPT4 Code Description: [id number] - DEB SUBQ TISSUE 20 SQ CM/< ICD-10 Diagnosis Description S80.11XD Contusion of right lower leg, subsequent encounter I87.321 Chronic venous hypertension (idiopathic) with inflammation of Modifier: right lower ext Quantity: 1 remity CPT4 Code Description: [id number] [id number] - DEB SUBQ TISS EA ADDL 20CM ICD-10 Diagnosis Description S80.11XD Contusion of right lower leg, subsequent encounter I87.321 Chronic venous hypertension (idiopathic) with inflammation of Modifier: right lower ext Quantity: 1 remity Physician Procedures CPT4 Code Description: [id number] 11042 - WC PHYS SUBQ TISS 20 SQ CM ICD-10 Diagnosis Description S80.11XD Contusion of right lower leg, subsequent encounter I87.321 Chronic venous hypertension (idiopathic) with inflammation of r Modifier: ight lower extr Quantity: 1 emity CPT4 Code  Description: [id number] - WC PHYS SUBQ TISS EA ADDL 20 CM ICD-10 Diagnosis Description S80.11XD Contusion of right lower leg, subsequent encounter I87.321 Chronic venous hypertension (idiopathic) with inflammation of r Modifier: ight lower extr Quantity: 1 emity Electronic Signature(s) Signed: 05/01/2019 5:11:23 PM By: Linton Ham MD Entered By: Linton Ham on 05/01/2019 15:23:46

## 2019-05-08 ENCOUNTER — Encounter: Payer: Medicare Other | Admitting: Internal Medicine

## 2019-05-08 ENCOUNTER — Other Ambulatory Visit: Payer: Self-pay

## 2019-05-08 DIAGNOSIS — S8011XD Contusion of right lower leg, subsequent encounter: Secondary | ICD-10-CM | POA: Diagnosis not present

## 2019-05-09 NOTE — Progress Notes (Addendum)
[name], [name] ([id number]) Visit Report for 05/08/2019 HPI Details Patient Name: [name], [name] Date of Service: 05/08/2019 1:30 PM Medical Record Number: [id number] Patient Account Number: [id number] Date of Birth/Sex: Feb 19, 1924 (85 y.o. F) Treating RN: Cornell Barman Primary Care Provider: Paulita Cradle Other Clinician: Referring Provider: Paulita Cradle Treating Provider/Extender: Tito Dine in Treatment: 3 History of Present Illness HPI Description: 08/06/18 on evaluation today patient actually presents for initial evaluation concerning a right anterior lower extremity ulcer which has been present for 4-5 months. She is a very poor historian really does not know if she tripped or how in fact she fell and injured this area. Upon further questioning it appears she actually has dimension I will explain a scenario such as how to perform the dressing changes and then she will ask me questions such as how often it should be done when I very specifically pointed this out to her. I definitely think that there is a strong component of dementia here leading to her poor history. She does have a history of atrial fibrillation as will a stroke and is on blood thinners chronically for this. She also has stage IV chronic kidney disease. Patient's wound over the anterior lower extremity of the right leg actually show signs of being completely eschar covered upon my initial evaluation today. No fevers, chills, nausea, or vomiting noted at this time. 08/20/18 on evaluation today patient's wound actually appears to be doing significantly better which is excellent news. She has been having her daughter change the dressing's which I think has worked out great for her. With that being said we had previously ordered home health although they never actually got started simply due to the fact that I was concerned about the patient's dimension of her being able to actually perform the  dressing changes appropriately of her own accord. Nonetheless with her daughter taking care of this for her I see no issues and they seem to be doing excellent at this point. 09/03/18 on evaluation today patient's wound actually appears to be doing much better which is good news. With that being said she did initially have a lot of dry drainage on the surface of the wound. This is subsequently due to the fact that on Friday patient's daughter Cristina [name] she will move the dressing to clean the area and then forgot to reapply the dressing before she left her mother's home. She subsequently called her father to get him to apply it but when she picked her mother up today to bring her to the appointment this still did not have a dressing on it. Nonetheless things do not appear to be doing too poorly which is good news overall I do feel like she's showing signs of improvement. 09/10/18 on evaluation today patient's wound actually appears to be completely healed which is excellent news. Overall she's been tolerating the dressing changes without complication. READMISSION 04/17/2019 This is a 85 year old woman who has a history of a wound on her right anterior lower extremity of uncertain etiology in late 2019 early 2020. She was seen in this clinic and healed out with standard care. She has chronic venous insufficiency. She does not wear stockings. She is not a diabetic. Noticed by her husband to have a fairly large hematoma on the left anterior lower leg. They are here for our review of this. She is on Plavix and aspirin presumably for prophylaxis and atrial fibrillation. They have not been doing anything to the wound. The patient has a  history of atrial fibrillation on Plavix and aspirin, dementia for which she has not had an evaluation history of TIA/CVA, stage IV chronic renal failure, aortic stenosis. We did not do ABIs because of location of the area. 9/16; the patient came in with the hematoma still  largely intact which was surprising however with gentle palpation it was also noted to be leaking inferiorly. Using pickups and scissors I remove this denuded skin on the top. Gauze and saline to remove the gelatinous hematoma and finally debrided the wound surface. We will use silver alginate 9/23; the hematoma was evacuated last week on the left anterior lower extremity. Wound was debrided. We use silver Mosco, Aaryana C. ([id number]) alginate under compression. Arrives today with a fair amount of bleeding extending into the secondary dressings nevertheless the wound looks quite good healthy tissue and measuring smaller Electronic Signature(s) Signed: 05/13/2019 9:41:49 AM By: Gretta Cool, BSN, RN, CWS, Kim RN, BSN Signed: 05/15/2019 9:15:25 AM By: Linton Ham MD Previous Signature: 05/08/2019 5:27:47 PM Version By: Linton Ham MD Entered By: Gretta Cool, BSN, RN, CWS, Kim on 05/13/2019 09:41:49 [name], [name] ([id number]) -------------------------------------------------------------------------------- Physical Exam Details Patient Name: [name], [name] Date of Service: 05/08/2019 1:30 PM Medical Record Number: [id number] Patient Account Number: [id number] Date of Birth/Sex: [birth year] (85 y.o. F) Treating RN: Cornell Barman Primary Care Provider: Paulita Cradle Other Clinician: Referring Provider: Paulita Cradle Treating Provider/Extender: Ricard Dillon Weeks in Treatment: 3 Constitutional Sitting or standing Blood Pressure is within target range for patient.. Pulse regular and within target range for patient.Marland Kitchen Respirations regular, non-labored and within target range.. Temperature is normal and within the target range for the patient.Marland Kitchen appears in no distress. Eyes Conjunctivae clear. No discharge. Respiratory Respiratory effort is easy and symmetric bilaterally. Rate is normal at rest and on room air.. Cardiovascular Pedal pulses palpable. Edema control is  adequate. Integumentary (Hair, Skin) There is some erythema around the wound but no tenderness changes of chronic venous insufficiency. Psychiatric No evidence of depression, anxiety, or agitation. Calm, cooperative, and communicative. Appropriate interactions and affect.. Notes Wound exam; area in question on the right anterior lower tibia. The wound is measuring smaller. Surface looks healthy. Almost surprisingly no debridement was necessary. Some surrounding erythema but no tenderness is noted. Electronic Signature(s) Signed: 05/08/2019 5:27:47 PM By: Linton Ham MD Entered By: Linton Ham on 05/08/2019 13:56:34 [name], [name] ([id number]) -------------------------------------------------------------------------------- Physician Orders Details Patient Name: [name], [name] Date of Service: 05/08/2019 1:30 PM Medical Record Number: [id number] Patient Account Number: [id number] Date of Birth/Sex: 09/27/1923 (85 y.o. F) Treating RN: Cornell Barman Primary Care Provider: Paulita Cradle Other Clinician: Referring Provider: Paulita Cradle Treating Provider/Extender: Tito Dine in Treatment: 3 Verbal / Phone Orders: No Diagnosis Coding Wound Cleansing Wound #2 Left,Anterior Lower Leg o Dial antibacterial soap, wash wounds, rinse and pat dry prior to dressing wounds Primary Wound Dressing Wound #2 Left,Anterior Lower Leg o Silver Alginate Secondary Dressing Wound #2 Left,Anterior Lower Leg o ABD pad Dressing Change Frequency Wound #2 Left,Anterior Lower Leg o Change dressing every week Follow-up Appointments Wound #2 Left,Anterior Lower Leg o Return Appointment in 1 week. o Nurse Visit as needed Edema Control Wound #2 Left,Anterior Lower Leg o 3 Layer Compression System - Left Lower Extremity - unna to anchor Additional Orders / Instructions Wound #2 Left,Anterior Lower Leg o Activity as tolerated Electronic Signature(s) Signed:  05/08/2019 5:27:47 PM By: Linton Ham MD Signed: 05/08/2019 5:53:54 PM By: Gretta Cool, BSN, RN, CWS, Kim RN, BSN Entered By:  Gretta Cool, BSN, RN, CWS, Kim on 05/08/2019 13:43:14 [name], [name] ([id number]) -------------------------------------------------------------------------------- Problem List Details Patient Name: Killman, Esteen C. Date of Service: 05/08/2019 1:30 PM Medical Record Number: [id number] Patient Account Number: [id number] Date of Birth/Sex: [birth year] (85 y.o. F) Treating RN: Cornell Barman Primary Care Provider: Paulita Cradle Other Clinician: Referring Provider: Paulita Cradle Treating Provider/Extender: Tito Dine in Treatment: 3 Active Problems ICD-10 Evaluated Encounter Code Description Active Date Today Diagnosis S80.11XD Contusion of right lower leg, subsequent encounter 04/17/2019 No Yes I87.321 Chronic venous hypertension (idiopathic) with inflammation of 04/17/2019 No Yes right lower extremity Inactive Problems Resolved Problems Electronic Signature(s) Signed: 05/08/2019 5:27:47 PM By: Linton Ham MD Entered By: Linton Ham on 05/08/2019 13:53:47 [name], [name] ([id number]) -------------------------------------------------------------------------------- Progress Note Details Patient Name: [name], [name] Date of Service: 05/08/2019 1:30 PM Medical Record Number: [id number] Patient Account Number: [id number] Date of Birth/Sex: 10-Jan-1924 (85 y.o. F) Treating RN: Cornell Barman Primary Care Provider: Paulita Cradle Other Clinician: Referring Provider: Paulita Cradle Treating Provider/Extender: Tito Dine in Treatment: 3 Subjective History of Present Illness (HPI) 08/06/18 on evaluation today patient actually presents for initial evaluation concerning a right anterior lower extremity ulcer which has been present for 4-5 months. She is a very poor historian really does not know if she tripped or how in fact she  fell and injured this area. Upon further questioning it appears she actually has dimension I will explain a scenario such as how to perform the dressing changes and then she will ask me questions such as how often it should be done when I very specifically pointed this out to her. I definitely think that there is a strong component of dementia here leading to her poor history. She does have a history of atrial fibrillation as will a stroke and is on blood thinners chronically for this. She also has stage IV chronic kidney disease. Patient's wound over the anterior lower extremity of the right leg actually show signs of being completely eschar covered upon my initial evaluation today. No fevers, chills, nausea, or vomiting noted at this time. 08/20/18 on evaluation today patient's wound actually appears to be doing significantly better which is excellent news. She has been having her daughter change the dressing's which I think has worked out great for her. With that being said we had previously ordered home health although they never actually got started simply due to the fact that I was concerned about the patient's dimension of her being able to actually perform the dressing changes appropriately of her own accord. Nonetheless with her daughter taking care of this for her I see no issues and they seem to be doing excellent at this point. 09/03/18 on evaluation today patient's wound actually appears to be doing much better which is good news. With that being said she did initially have a lot of dry drainage on the surface of the wound. This is subsequently due to the fact that on Friday patient's daughter Cristina [name] she will move the dressing to clean the area and then forgot to reapply the dressing before she left her mother's home. She subsequently called her father to get him to apply it but when she picked her mother up today to bring her to the appointment this still did not have a dressing on it.  Nonetheless things do not appear to be doing too poorly which is good news overall I do feel like she's showing signs of improvement. 09/10/18 on evaluation today patient's wound actually  appears to be completely healed which is excellent news. Overall she's been tolerating the dressing changes without complication. READMISSION 04/17/2019 This is a 85 year old woman who has a history of a wound on her right anterior lower extremity of uncertain etiology in late 2019 early 2020. She was seen in this clinic and healed out with standard care. She has chronic venous insufficiency. She does not wear stockings. She is not a diabetic. Noticed by her husband to have a fairly large hematoma on the left anterior lower leg. They are here for our review of this. She is on Plavix and aspirin presumably for prophylaxis and atrial fibrillation. They have not been doing anything to the wound. The patient has a history of atrial fibrillation on Plavix and aspirin, dementia for which she has not had an evaluation history of TIA/CVA, stage IV chronic renal failure, aortic stenosis. We did not do ABIs because of location of the area. 9/16; the patient came in with the hematoma still largely intact which was surprising however with gentle palpation it was also noted to be leaking inferiorly. Using pickups and scissors I remove this denuded skin on the top. Gauze and saline to remove the gelatinous hematoma and finally debrided the wound surface. We will use silver alginate 9/23; the hematoma was evacuated last week on the left anterior lower extremity. Wound was debrided. We use silver alginate under compression. Arrives today with a fair amount of bleeding extending into the secondary dressings nevertheless the wound looks quite good healthy tissue and measuring smaller [name], [name] ([id number]) Objective Constitutional Sitting or standing Blood Pressure is within target range for patient.. Pulse regular and  within target range for patient.Marland Kitchen Respirations regular, non-labored and within target range.. Temperature is normal and within the target range for the patient.Marland Kitchen appears in no distress. Vitals Time Taken: 1:25 PM, Height: 65 in, Weight: 110 lbs, BMI: 18.3, Temperature: 98.6 F, Pulse: 64 bpm, Respiratory Rate: 16 breaths/min, Blood Pressure: 114/71 mmHg. Eyes Conjunctivae clear. No discharge. Respiratory Respiratory effort is easy and symmetric bilaterally. Rate is normal at rest and on room air.. Cardiovascular Pedal pulses palpable. Edema control is adequate. Psychiatric No evidence of depression, anxiety, or agitation. Calm, cooperative, and communicative. Appropriate interactions and affect.. General Notes: Wound exam; area in question on the right anterior lower tibia. The wound is measuring smaller. Surface looks healthy. Almost surprisingly no debridement was necessary. Some surrounding erythema but no tenderness is noted. Integumentary (Hair, Skin) There is some erythema around the wound but no tenderness changes of chronic venous insufficiency. Wound #2 status is Open. Original cause of wound was Trauma. The wound is located on the Left,Anterior Lower Leg. The wound measures 4.3cm length x 3.1cm width x 0.1cm depth; 10.469cm^2 area and 1.047cm^3 volume. The wound is limited to skin breakdown. There is no tunneling or undermining noted. There is a small amount of serosanguineous drainage noted. The wound margin is indistinct and nonvisible. There is no granulation within the wound bed. There is no necrotic tissue within the wound bed. Assessment Active Problems ICD-10 Contusion of right lower leg, subsequent encounter Chronic venous hypertension (idiopathic) with inflammation of right lower extremity [name], [name] ([id number]) Procedures Wound #2 Pre-procedure diagnosis of Wound #2 is a Trauma, Other located on the Left,Anterior Lower Leg . There was a Three  Layer Compression Therapy Procedure by Cornell Barman, RN. Post procedure Diagnosis Wound #2: Same as Pre-Procedure Plan Wound Cleansing: Wound #2 Left,Anterior Lower Leg: Dial antibacterial soap, wash wounds, rinse and  pat dry prior to dressing wounds Primary Wound Dressing: Wound #2 Left,Anterior Lower Leg: Silver Alginate Secondary Dressing: Wound #2 Left,Anterior Lower Leg: ABD pad Dressing Change Frequency: Wound #2 Left,Anterior Lower Leg: Change dressing every week Follow-up Appointments: Wound #2 Left,Anterior Lower Leg: Return Appointment in 1 week. Nurse Visit as needed Edema Control: Wound #2 Left,Anterior Lower Leg: 3 Layer Compression System - Left Lower Extremity - unna to anchor Additional Orders / Instructions: Wound #2 Left,Anterior Lower Leg: Activity as tolerated 1. Left anterior lower leg. We continued with silver alginate ABDs under 3 layer compression Electronic Signature(s) Signed: 05/13/2019 9:44:52 AM By: Gretta Cool, BSN, RN, CWS, Kim RN, BSN Signed: 05/15/2019 9:15:25 AM By: Linton Ham MD Previous Signature: 05/08/2019 5:27:47 PM Version By: Linton Ham MD Entered By: Gretta Cool, BSN, RN, CWS, Kim on 05/13/2019 09:44:52 [name], [name] ([id number]) -------------------------------------------------------------------------------- Tower Details Patient Name: [name], [name] Date of Service: 05/08/2019 Medical Record Number: [id number] Patient Account Number: [id number] Date of Birth/Sex: 09-Oct-1923 (85 y.o. F) Treating RN: Cornell Barman Primary Care Provider: Paulita Cradle Other Clinician: Referring Provider: Paulita Cradle Treating Provider/Extender: Tito Dine in Treatment: 3 Diagnosis Coding ICD-10 Codes Code Description S80.12XD Contusion of left lower leg, subsequent encounter I87.322 Chronic venous hypertension (idiopathic) with inflammation of left lower extremity Facility Procedures CPT4 Code: [id number] Description:  (Facility Use Only) [phone] - [place] LWR LT LEG Modifier: Quantity: 1 Physician Procedures CPT4 Code: [id number] Description: [id number] - WC PHYS LEVEL 3 - EST PT ICD-10 Diagnosis Description S80.12XD Contusion of left lower leg, subsequent encounter Modifier: Quantity: 1 Electronic Signature(s) Signed: 05/13/2019 9:44:07 AM By: Gretta Cool, BSN, RN, CWS, Kim RN, BSN Signed: 05/15/2019 9:15:25 AM By: Linton Ham MD Previous Signature: 05/08/2019 5:27:47 PM Version By: Linton Ham MD Entered By: Gretta Cool, BSN, RN, CWS, Kim on 05/13/2019 09:44:07

## 2019-05-09 NOTE — Progress Notes (Signed)
Hebb, Allexus C. ([id number]) Visit Report for 05/08/2019 Arrival Information Details Patient Name: Cristina [name], [name] Date of Service: 05/08/2019 1:30 PM Medical Record Number: [id number] Patient Account Number: [id number] Date of Birth/Sex: [birth year] (85 Cristina [name].o. F) Treating RN: Harold Barban Primary Care Isa Kohlenberg: Paulita Cradle Other Clinician: Referring Akita Maxim: Paulita Cradle Treating Shalynn Jorstad/Extender: Tito Dine in Treatment: 3 Visit Information History Since Last Visit Added or deleted any medications: No Patient Arrived: Walker Any new allergies or adverse reactions: No Arrival Time: 13:27 Had a fall or experienced change in No Accompanied By: daughter activities of daily living that may affect Transfer Assistance: None risk of falls: Patient Identification Verified: Yes Signs or symptoms of abuse/neglect since last visito No Secondary Verification Process Yes Hospitalized since last visit: No Completed: Has Dressing in Place as Prescribed: Yes Patient Has Alerts: Yes Has Compression in Place as Prescribed: Yes Patient Alerts: Patient on Blood Pain Present Now: No Thinner Pt taking plavix Electronic Signature(s) Signed: 05/08/2019 4:54:02 PM By: Harold Barban Entered By: Harold Barban on 05/08/2019 13:29:00 Rettinger, Alisha C. ([id number]) -------------------------------------------------------------------------------- Clinic Level of Care Assessment Details Patient Name: Cristina [name], [name] Date of Service: 05/08/2019 1:30 PM Medical Record Number: [id number] Patient Account Number: [id number] Date of Birth/Sex: 09/28/1923 (85 Cristina [name].o. F) Treating RN: Cornell Barman Primary Care Alexius Hangartner: Paulita Cradle Other Clinician: Referring Adylynn Hertenstein: Paulita Cradle Treating Karmela Bram/Extender: Tito Dine in Treatment: 3 Clinic Level of Care Assessment Items TOOL 1 Quantity Score []  - Use when EandM and Procedure is performed on INITIAL  visit 0 ASSESSMENTS - Nursing Assessment / Reassessment []  - General Physical Exam (combine w/ comprehensive assessment (listed just below) when 0 performed on new pt. evals) []  - 0 Comprehensive Assessment (HX, ROS, Risk Assessments, Wounds Hx, etc.) ASSESSMENTS - Wound and Skin Assessment / Reassessment []  - Dermatologic / Skin Assessment (not related to wound area) 0 ASSESSMENTS - Ostomy and/or Continence Assessment and Care []  - Incontinence Assessment and Management 0 []  - 0 Ostomy Care Assessment and Management (repouching, etc.) PROCESS - Coordination of Care []  - Simple Patient / Family Education for ongoing care 0 []  - 0 Complex (extensive) Patient / Family Education for ongoing care []  - 0 Staff obtains Programmer, systems, Records, Test Results / Process Orders []  - 0 Staff telephones HHA, Nursing Homes / Clarify orders / etc []  - 0 Routine Transfer to another Facility (non-emergent condition) []  - 0 Routine Hospital Admission (non-emergent condition) []  - 0 New Admissions / Biomedical engineer / Ordering NPWT, Apligraf, etc. []  - 0 Emergency Hospital Admission (emergent condition) PROCESS - Special Needs []  - Pediatric / Minor Patient Management 0 []  - 0 Isolation Patient Management []  - 0 Hearing / Language / Visual special needs []  - 0 Assessment of Community assistance (transportation, D/C planning, etc.) []  - 0 Additional assistance / Altered mentation []  - 0 Support Surface(s) Assessment (bed, cushion, seat, etc.) Kroeker, Mykel C. ([id number]) INTERVENTIONS - Miscellaneous []  - External ear exam 0 []  - 0 Patient Transfer (multiple staff / Civil Service fast streamer / Similar devices) []  - 0 Simple Staple / Suture removal (25 or less) []  - 0 Complex Staple / Suture removal (26 or more) []  - 0 Hypo/Hyperglycemic Management (do not check if billed separately) []  - 0 Ankle / Brachial Index (ABI) - do not check if billed separately Has the patient been seen at the  hospital within the last three years: Yes Total Score: 0 Level Of Care: ____ Electronic Signature(s) Signed: 05/08/2019 5:53:54 PM By: Gretta Cool,  BSN, RN, CWS, Kim RN, BSN Entered By: Gretta Cool, BSN, RN, CWS, Kim on 05/08/2019 13:43:30 Cronkright, Rigoberto Noel ([id number]) -------------------------------------------------------------------------------- Compression Therapy Details Patient Name: Cristina [name], [name] Date of Service: 05/08/2019 1:30 PM Medical Record Number: [id number] Patient Account Number: [id number] Date of Birth/Sex: [birth year] (85 Cristina [name].o. F) Treating RN: Cornell Barman Primary Care Lenna Hagarty: Paulita Cradle Other Clinician: Referring Jericca Russett: Paulita Cradle Treating Tahir Blank/Extender: Tito Dine in Treatment: 3 Compression Therapy Performed for Wound Assessment: Wound #2 Left,Anterior Lower Leg Performed By: Clinician Cornell Barman, RN Compression Type: Three Layer Post Procedure Diagnosis Same as Pre-procedure Electronic Signature(s) Signed: 05/08/2019 5:53:54 PM By: Gretta Cool, BSN, RN, CWS, Kim RN, BSN Entered By: Gretta Cool, BSN, RN, CWS, Kim on 05/08/2019 13:42:46 Girdner, Rigoberto Noel ([id number]) -------------------------------------------------------------------------------- Encounter Discharge Information Details Patient Name: Cristina [name], [name] Date of Service: 05/08/2019 1:30 PM Medical Record Number: [id number] Patient Account Number: [id number] Date of Birth/Sex: 15-Feb-1924 (85 Cristina [name].o. F) Treating RN: Cornell Barman Primary Care Jeshawn Melucci: Paulita Cradle Other Clinician: Referring Lavona Norsworthy: Paulita Cradle Treating Amalia Edgecombe/Extender: Tito Dine in Treatment: 3 Encounter Discharge Information Items Discharge Condition: Stable Ambulatory Status: Ambulatory Discharge Destination: Home Transportation: Private Auto Accompanied By: daughter Schedule Follow-up Appointment: Yes Clinical Summary of Care: Electronic Signature(s) Signed: 05/08/2019 5:53:54 PM  By: Gretta Cool, BSN, RN, CWS, Kim RN, BSN Entered By: Gretta Cool, BSN, RN, CWS, Kim on 05/08/2019 13:45:35 Dack, Sabre C. ([id number]) -------------------------------------------------------------------------------- Lower Extremity Assessment Details Patient Name: Cristina [name], [name] Date of Service: 05/08/2019 1:30 PM Medical Record Number: [id number] Patient Account Number: [id number] Date of Birth/Sex: September 15, 1923 (85 Cristina [name].o. F) Treating RN: Harold Barban Primary Care Laguana Desautel: Paulita Cradle Other Clinician: Referring Anthonny Schiller: Paulita Cradle Treating Holston Oyama/Extender: Tito Dine in Treatment: 3 Edema Assessment Assessed: [Left: No] [Right: No] [Left: Edema] [Right: :] Calf Left: Right: Point of Measurement: 32 cm From Medial Instep 31 cm cm Ankle Left: Right: Point of Measurement: 10 cm From Medial Instep 17 cm cm Vascular Assessment Pulses: Dorsalis Pedis Palpable: [Left:Yes] Posterior Tibial Palpable: [Left:Yes] Electronic Signature(s) Signed: 05/08/2019 4:54:02 PM By: Harold Barban Entered By: Harold Barban on 05/08/2019 13:38:00 Velarde, Merilynn C. ([id number]) -------------------------------------------------------------------------------- Multi Wound Chart Details Patient Name: Cristina [name], [name] Date of Service: 05/08/2019 1:30 PM Medical Record Number: [id number] Patient Account Number: [id number] Date of Birth/Sex: 11-29-1923 (85 Cristina [name].o. F) Treating RN: Cornell Barman Primary Care Gracelynne Benedict: Paulita Cradle Other Clinician: Referring Lillieanna Tuohy: Paulita Cradle Treating Hensley Aziz/Extender: Tito Dine in Treatment: 3 Vital Signs Height(in): 65 Pulse(bpm): 6 Weight(lbs): 110 Blood Pressure(mmHg): 114/71 Body Mass Index(BMI): 18 Temperature(F): 98.6 Respiratory Rate 16 (breaths/min): Photos: [N/A:N/A] Wound Location: Left Lower Leg - Anterior N/A N/A Wounding Event: Trauma N/A N/A Primary Etiology: Trauma, Other N/A  N/A Comorbid History: Cataracts, Arrhythmia, N/A N/A Congestive Heart Failure, End Stage Renal Disease Date Acquired: 04/14/2019 N/A N/A Weeks of Treatment: 3 N/A N/A Wound Status: Open N/A N/A Measurements L x W x D 4.3x3.1x0.1 N/A N/A (cm) Area (cm) : 10.469 N/A N/A Volume (cm) : 1.047 N/A N/A % Reduction in Area: 23.80% N/A N/A % Reduction in Volume: 23.80% N/A N/A Classification: Unclassifiable N/A N/A Exudate Amount: Small N/A N/A Exudate Type: Serosanguineous N/A N/A Exudate Color: red, brown N/A N/A Wound Margin: Indistinct, nonvisible N/A N/A Granulation Amount: None Present (0%) N/A N/A Necrotic Amount: None Present (0%) N/A N/A Exposed Structures: Fascia: No N/A N/A Fat Layer (Subcutaneous Tissue) Exposed: No Tendon: No Muscle: No Joint: No Sleight, Camielle C. ([id number]) Bone: No Limited to Skin Breakdown Epithelialization: None N/A  N/A Procedures Performed: Compression Therapy N/A N/A Treatment Notes Wound #2 (Left, Anterior Lower Leg) Notes Silver cell, ABD, 3-Layer Left, unna to anchor Electronic Signature(s) Signed: 05/08/2019 5:27:47 PM By: Linton Ham MD Entered By: Linton Ham on 05/08/2019 13:53:58 Bastedo, Tziporah C. ([id number]) -------------------------------------------------------------------------------- [place] Plan Details Patient Name: Cristina [name], [name] Date of Service: 05/08/2019 1:30 PM Medical Record Number: [id number] Patient Account Number: [id number] Date of Birth/Sex: [birth year] (85 Cristina [name].o. F) Treating RN: Cornell Barman Primary Care Chelesea Weiand: Paulita Cradle Other Clinician: Referring Marylu Dudenhoeffer: Paulita Cradle Treating Jasemine Nawaz/Extender: Tito Dine in Treatment: 3 Active Inactive Abuse / Safety / Falls / Self Care Management Nursing Diagnoses: Potential for falls Goals: Patient will remain injury free related to falls Date Initiated: 04/17/2019 Target Resolution Date: 05/24/2019 Goal Status:  Active Interventions: Assess fall risk on admission and as needed Notes: Orientation to the Wound Care Program Nursing Diagnoses: Knowledge deficit related to the wound healing center program Goals: Patient/caregiver will verbalize understanding of the [facility] Program Date Initiated: 04/17/2019 Target Resolution Date: 05/24/2019 Goal Status: Active Interventions: Provide education on orientation to the wound center Notes: Wound/Skin Impairment Nursing Diagnoses: Impaired tissue integrity Goals: Ulcer/skin breakdown will have a volume reduction of 30% by week 4 Date Initiated: 04/17/2019 Target Resolution Date: 05/17/2019 Goal Status: Active Interventions: Assess ulceration(s) every visit Campoli, Makenleigh C. ([id number]) Treatment Activities: Skin care regimen initiated : 04/17/2019 Notes: Electronic Signature(s) Signed: 05/08/2019 5:53:54 PM By: Gretta Cool, BSN, RN, CWS, Kim RN, BSN Entered By: Gretta Cool, BSN, RN, CWS, Kim on 05/08/2019 13:41:25 Cristina [name], [name] ([id number]) -------------------------------------------------------------------------------- Pain Assessment Details Patient Name: Cristina [name], [name] Date of Service: 05/08/2019 1:30 PM Medical Record Number: [id number] Patient Account Number: [id number] Date of Birth/Sex: [birth year] (85 Cristina [name].o. F) Treating RN: Harold Barban Primary Care Darvin Dials: Paulita Cradle Other Clinician: Referring Yalissa Fink: Paulita Cradle Treating Corrie Reder/Extender: Tito Dine in Treatment: 3 Active Problems Location of Pain Severity and Description of Pain Patient Has Paino No Site Locations Pain Management and Medication Current Pain Management: Electronic Signature(s) Signed: 05/08/2019 4:54:02 PM By: Harold Barban Entered By: Harold Barban on 05/08/2019 13:29:23 Cristina [name], [name] ([id number]) -------------------------------------------------------------------------------- Patient/Caregiver Education  Details Patient Name: Cristina [name], [name] Date of Service: 05/08/2019 1:30 PM Medical Record Number: [id number] Patient Account Number: [id number] Date of Birth/Gender: 03-23-1924 (61 Cristina [name].o. F) Treating RN: Cornell Barman Primary Care Physician: Paulita Cradle Other Clinician: Referring Physician: Paulita Cradle Treating Physician/Extender: Tito Dine in Treatment: 3 Education Assessment Education Provided To: Patient Education Topics Provided Venous: Handouts: Controlling Swelling with Multilayered Compression Wraps Methods: Demonstration, Explain/Verbal Responses: State content correctly Wound/Skin Impairment: Handouts: Caring for Your Ulcer Methods: Demonstration, Explain/Verbal Responses: State content correctly Electronic Signature(s) Signed: 05/08/2019 5:53:54 PM By: Gretta Cool, BSN, RN, CWS, Kim RN, BSN Entered By: Gretta Cool, BSN, RN, CWS, Kim on 05/08/2019 13:44:05 Cristina [name], [name] ([id number]) -------------------------------------------------------------------------------- Wound Assessment Details Patient Name: Cristina [name], [name] Date of Service: 05/08/2019 1:30 PM Medical Record Number: [id number] Patient Account Number: [id number] Date of Birth/Sex: 01-05-1924 (85 Cristina [name].o. F) Treating RN: Harold Barban Primary Care Courteny Egler: Paulita Cradle Other Clinician: Referring Raunel Dimartino: Paulita Cradle Treating Shanasia Ibrahim/Extender: Tito Dine in Treatment: 3 Wound Status Wound Number: 2 Primary Trauma, Other Etiology: Wound Location: Left Lower Leg - Anterior Wound Open Wounding Event: Trauma Status: Date Acquired: 04/14/2019 Comorbid Cataracts, Arrhythmia, Congestive Heart Weeks Of Treatment: 3 History: Failure, End Stage Renal Disease Clustered Wound: No Photos Wound Measurements Length: (cm) 4.3 Width: (cm) 3.1 Depth: (cm) 0.1 Area: (cm) 10.469  Volume: (cm) 1.047 % Reduction in Area: 23.8% % Reduction in Volume: 23.8% Epithelialization:  None Tunneling: No Undermining: No Wound Description Classification: Unclassifiable Foul Odor Wound Margin: Indistinct, nonvisible Slough/Fib Exudate Amount: Small Exudate Type: Serosanguineous Exudate Color: red, brown After Cleansing: No rino No Wound Bed Granulation Amount: None Present (0%) Exposed Structure Necrotic Amount: None Present (0%) Fascia Exposed: No Fat Layer (Subcutaneous Tissue) Exposed: No Tendon Exposed: No Muscle Exposed: No Joint Exposed: No Bone Exposed: No Limited to Skin Breakdown Treatment Notes Cristina [name], Yennifer C. ([id number]) Wound #2 (Left, Anterior Lower Leg) Notes Silver cell, ABD, 3-Layer Left, unna to anchor Electronic Signature(s) Signed: 05/08/2019 4:54:02 PM By: Harold Barban Entered By: Harold Barban on 05/08/2019 13:36:37 Bellanca, Takyia C. ([id number]) -------------------------------------------------------------------------------- Vitals Details Patient Name: Perrier, Jelisha C. Date of Service: 05/08/2019 1:30 PM Medical Record Number: [id number] Patient Account Number: [id number] Date of Birth/Sex: Jan 04, 1924 (85 Cristina [name].o. F) Treating RN: Harold Barban Primary Care Mariaisabel Bodiford: Paulita Cradle Other Clinician: Referring Huntley Knoop: Paulita Cradle Treating Menaal Russum/Extender: Tito Dine in Treatment: 3 Vital Signs Time Taken: 13:25 Temperature (F): 98.6 Height (in): 65 Pulse (bpm): 64 Weight (lbs): 110 Respiratory Rate (breaths/min): 16 Body Mass Index (BMI): 18.3 Blood Pressure (mmHg): 114/71 Reference Range: 80 - 120 mg / dl Electronic Signature(s) Signed: 05/08/2019 4:54:02 PM By: Harold Barban Entered By: Harold Barban on 05/08/2019 13:32:05

## 2019-05-15 ENCOUNTER — Encounter: Payer: Medicare Other | Admitting: Internal Medicine

## 2019-05-15 ENCOUNTER — Other Ambulatory Visit: Payer: Self-pay

## 2019-05-15 DIAGNOSIS — S8011XD Contusion of right lower leg, subsequent encounter: Secondary | ICD-10-CM | POA: Diagnosis not present

## 2019-05-15 NOTE — Progress Notes (Signed)
[name], [name] ([id number]) Visit Report for 05/15/2019 HPI Details Patient Name: [name], [name] Date of Service: 05/15/2019 1:30 PM Medical Record Number: [id number] Patient Account Number: [id number] Date of Birth/Sex: 1924/01/16 (85 y.o. F) Treating RN: Cornell Barman Primary Care Provider: Paulita Cradle Other Clinician: Referring Provider: Paulita Cradle Treating Provider/Extender: Tito Dine in Treatment: 4 History of Present Illness HPI Description: 08/06/18 on evaluation today patient actually presents for initial evaluation concerning a right anterior lower extremity ulcer which has been present for 4-5 months. She is a very poor historian really does not know if she tripped or how in fact she fell and injured this area. Upon further questioning it appears she actually has dimension I will explain a scenario such as how to perform the dressing changes and then she will ask me questions such as how often it should be done when I very specifically pointed this out to her. I definitely think that there is a strong component of dementia here leading to her poor history. She does have a history of atrial fibrillation as will a stroke and is on blood thinners chronically for this. She also has stage IV chronic kidney disease. Patient's wound over the anterior lower extremity of the right leg actually show signs of being completely eschar covered upon my initial evaluation today. No fevers, chills, nausea, or vomiting noted at this time. 08/20/18 on evaluation today patient's wound actually appears to be doing significantly better which is excellent news. She has been having her daughter change the dressing's which I think has worked out great for her. With that being said we had previously ordered home health although they never actually got started simply due to the fact that I was concerned about the patient's dimension of her being able to actually perform the  dressing changes appropriately of her own accord. Nonetheless with her daughter taking care of this for her I see no issues and they seem to be doing excellent at this point. 09/03/18 on evaluation today patient's wound actually appears to be doing much better which is good news. With that being said she did initially have a lot of dry drainage on the surface of the wound. This is subsequently due to the fact that on Friday patient's daughter [name] she will move the dressing to clean the area and then forgot to reapply the dressing before she left her mother's home. She subsequently called her father to get him to apply it but when she picked her mother up today to bring her to the appointment this still did not have a dressing on it. Nonetheless things do not appear to be doing too poorly which is good news overall I do feel like she's showing signs of improvement. 09/10/18 on evaluation today patient's wound actually appears to be completely healed which is excellent news. Overall she's been tolerating the dressing changes without complication. READMISSION 04/17/2019 This is a 85 year old woman who has a history of a wound on her right anterior lower extremity of uncertain etiology in late 2019 early 2020. She was seen in this clinic and healed out with standard care. She has chronic venous insufficiency. She does not wear stockings. She is not a diabetic. Noticed by her husband to have a fairly large hematoma on the left anterior lower leg. They are here for our review of this. She is on Plavix and aspirin presumably for prophylaxis and atrial fibrillation. They have not been doing anything to the wound. The patient has a  history of atrial fibrillation on Plavix and aspirin, dementia for which she has not had an evaluation history of TIA/CVA, stage IV chronic renal failure, aortic stenosis. We did not do ABIs because of location of the area. 9/16; the patient came in with the hematoma still  largely intact which was surprising however with gentle palpation it was also noted to be leaking inferiorly. Using pickups and scissors I remove this denuded skin on the top. Gauze and saline to remove the gelatinous hematoma and finally debrided the wound surface. We will use silver alginate 9/23; the hematoma was evacuated last week on the left anterior lower extremity. Wound was debrided. We use silver [name], [name] ([id number]) alginate under compression. Arrives today with a fair amount of bleeding extending into the secondary dressings nevertheless the wound looks quite good healthy tissue and measuring smaller 9/30; wound on the left anterior lower leg. Measures smaller. Surface of this is healthy. The patient has severe venous hypertension but does not wear stockings. Stockings have been suggested in the past she does not comply with them. We have been using silver alginate under 3 layer compression Electronic Signature(s) Signed: 05/15/2019 4:51:07 PM By: Linton Ham MD Entered By: Linton Ham on 05/15/2019 13:59:43 [name], [name] ([id number]) -------------------------------------------------------------------------------- Physical Exam Details Patient Name: [name], [name] Date of Service: 05/15/2019 1:30 PM Medical Record Number: [id number] Patient Account Number: [id number] Date of Birth/Sex: [birth year] (85 y.o. F) Treating RN: Cornell Barman Primary Care Provider: Paulita Cradle Other Clinician: Referring Provider: Paulita Cradle Treating Provider/Extender: Ricard Dillon Weeks in Treatment: 4 Constitutional Sitting or standing Blood Pressure is within target range for patient.. Pulse regular and within target range for patient.Marland Kitchen Respirations regular, non-labored and within target range.. Temperature is normal and within the target range for the patient.Marland Kitchen appears in no distress. Respiratory Respiratory effort is easy and symmetric bilaterally. Rate  is normal at rest and on room air.. Cardiovascular Pedal pulses are palpable. Markedly dilated small venules in the lower feet and ankles. Compatible with severe venous hypertension. Integumentary (Hair, Skin) Changes of venous hypertension no other skin lesions are seen. Psychiatric No evidence of depression, anxiety, or agitation. Calm, cooperative, and communicative. Appropriate interactions and affect.. Notes Wound exam; area questions on the right anterior lower tibia. During smaller. Surface looks healthy even under illumination. No mechanical debridement Electronic Signature(s) Signed: 05/15/2019 4:51:07 PM By: Linton Ham MD Entered By: Linton Ham on 05/15/2019 14:03:57 [name], [name] ([id number]) -------------------------------------------------------------------------------- Physician Orders Details Patient Name: [name], [name] Date of Service: 05/15/2019 1:30 PM Medical Record Number: [id number] Patient Account Number: [id number] Date of Birth/Sex: [birth year] (85 y.o. F) Treating RN: Cornell Barman Primary Care Provider: Paulita Cradle Other Clinician: Referring Provider: Paulita Cradle Treating Provider/Extender: Tito Dine in Treatment: 4 Verbal / Phone Orders: No Diagnosis Coding Wound Cleansing Wound #2 Left,Anterior Lower Leg o Dial antibacterial soap, wash wounds, rinse and pat dry prior to dressing wounds Anesthetic (add to Medication List) Wound #2 Left,Anterior Lower Leg o Topical Lidocaine 4% cream applied to wound bed prior to debridement (In Clinic Only). Primary Wound Dressing Wound #2 Left,Anterior Lower Leg o Silver Alginate Secondary Dressing Wound #2 Left,Anterior Lower Leg o ABD pad Dressing Change Frequency Wound #2 Left,Anterior Lower Leg o Change dressing every week Follow-up Appointments Wound #2 Left,Anterior Lower Leg o Return Appointment in 1 week. o Nurse Visit as needed Edema  Control Wound #2 Left,Anterior Lower Leg o 3 Layer Compression System - Left Lower Extremity - unna  to anchor Additional Orders / Instructions Wound #2 Left,Anterior Lower Leg o Activity as tolerated Electronic Signature(s) Signed: 05/15/2019 4:51:07 PM By: Linton Ham MD Signed: 05/15/2019 5:01:16 PM By: Gretta Cool, BSN, RN, CWS, Kim RN, BSN Entered By: Gretta Cool, BSN, RN, CWS, Kim on 05/15/2019 13:53:41 [name], [name] ([id number]) -------------------------------------------------------------------------------- Problem List Details Patient Name: Man, Myli C. Date of Service: 05/15/2019 1:30 PM Medical Record Number: [id number] Patient Account Number: [id number] Date of Birth/Sex: 01-21-1924 (85 y.o. F) Treating RN: Cornell Barman Primary Care Provider: Paulita Cradle Other Clinician: Referring Provider: Paulita Cradle Treating Provider/Extender: Tito Dine in Treatment: 4 Active Problems ICD-10 Evaluated Encounter Code Description Active Date Today Diagnosis S80.11XD Contusion of right lower leg, subsequent encounter 04/17/2019 No Yes I87.321 Chronic venous hypertension (idiopathic) with inflammation of 04/17/2019 No Yes right lower extremity Inactive Problems Resolved Problems Electronic Signature(s) Signed: 05/15/2019 4:51:07 PM By: Linton Ham MD Entered By: Linton Ham on 05/15/2019 13:58:35 Liao, Kera C. ([id number]) -------------------------------------------------------------------------------- Progress Note Details Patient Name: [name], [name] Date of Service: 05/15/2019 1:30 PM Medical Record Number: [id number] Patient Account Number: [id number] Date of Birth/Sex: Jan 19, 1924 (85 y.o. F) Treating RN: Cornell Barman Primary Care Provider: Paulita Cradle Other Clinician: Referring Provider: Paulita Cradle Treating Provider/Extender: Tito Dine in Treatment: 4 Subjective History of Present Illness (HPI) 08/06/18  on evaluation today patient actually presents for initial evaluation concerning a right anterior lower extremity ulcer which has been present for 4-5 months. She is a very poor historian really does not know if she tripped or how in fact she fell and injured this area. Upon further questioning it appears she actually has dimension I will explain a scenario such as how to perform the dressing changes and then she will ask me questions such as how often it should be done when I very specifically pointed this out to her. I definitely think that there is a strong component of dementia here leading to her poor history. She does have a history of atrial fibrillation as will a stroke and is on blood thinners chronically for this. She also has stage IV chronic kidney disease. Patient's wound over the anterior lower extremity of the right leg actually show signs of being completely eschar covered upon my initial evaluation today. No fevers, chills, nausea, or vomiting noted at this time. 08/20/18 on evaluation today patient's wound actually appears to be doing significantly better which is excellent news. She has been having her daughter change the dressing's which I think has worked out great for her. With that being said we had previously ordered home health although they never actually got started simply due to the fact that I was concerned about the patient's dimension of her being able to actually perform the dressing changes appropriately of her own accord. Nonetheless with her daughter taking care of this for her I see no issues and they seem to be doing excellent at this point. 09/03/18 on evaluation today patient's wound actually appears to be doing much better which is good news. With that being said she did initially have a lot of dry drainage on the surface of the wound. This is subsequently due to the fact that on Friday patient's daughter [name] she will move the dressing to clean the area and then  forgot to reapply the dressing before she left her mother's home. She subsequently called her father to get him to apply it but when she picked her mother up today to bring her to the appointment this  still did not have a dressing on it. Nonetheless things do not appear to be doing too poorly which is good news overall I do feel like she's showing signs of improvement. 09/10/18 on evaluation today patient's wound actually appears to be completely healed which is excellent news. Overall she's been tolerating the dressing changes without complication. READMISSION 04/17/2019 This is a 85 year old woman who has a history of a wound on her right anterior lower extremity of uncertain etiology in late 2019 early 2020. She was seen in this clinic and healed out with standard care. She has chronic venous insufficiency. She does not wear stockings. She is not a diabetic. Noticed by her husband to have a fairly large hematoma on the left anterior lower leg. They are here for our review of this. She is on Plavix and aspirin presumably for prophylaxis and atrial fibrillation. They have not been doing anything to the wound. The patient has a history of atrial fibrillation on Plavix and aspirin, dementia for which she has not had an evaluation history of TIA/CVA, stage IV chronic renal failure, aortic stenosis. We did not do ABIs because of location of the area. 9/16; the patient came in with the hematoma still largely intact which was surprising however with gentle palpation it was also noted to be leaking inferiorly. Using pickups and scissors I remove this denuded skin on the top. Gauze and saline to remove the gelatinous hematoma and finally debrided the wound surface. We will use silver alginate 9/23; the hematoma was evacuated last week on the left anterior lower extremity. Wound was debrided. We use silver alginate under compression. Arrives today with a fair amount of bleeding extending into the secondary  dressings nevertheless the wound looks quite good healthy tissue and measuring smaller 9/30; wound on the left anterior lower leg. Measures smaller. Surface of this is healthy. The patient has severe venous [name], [name] ([id number]) hypertension but does not wear stockings. Stockings have been suggested in the past she does not comply with them. We have been using silver alginate under 3 layer compression Objective Constitutional Sitting or standing Blood Pressure is within target range for patient.. Pulse regular and within target range for patient.Marland Kitchen Respirations regular, non-labored and within target range.. Temperature is normal and within the target range for the patient.Marland Kitchen appears in no distress. Vitals Time Taken: 1:38 PM, Height: 65 in, Weight: 110 lbs, BMI: 18.3, Temperature: 98.5 F, Pulse: 58 bpm, Respiratory Rate: 16 breaths/min, Blood Pressure: 98/45 mmHg. Respiratory Respiratory effort is easy and symmetric bilaterally. Rate is normal at rest and on room air.. Cardiovascular Pedal pulses are palpable. Markedly dilated small venules in the lower feet and ankles. Compatible with severe venous hypertension. Psychiatric No evidence of depression, anxiety, or agitation. Calm, cooperative, and communicative. Appropriate interactions and affect.. General Notes: Wound exam; area questions on the right anterior lower tibia. During smaller. Surface looks healthy even under illumination. No mechanical debridement Integumentary (Hair, Skin) Changes of venous hypertension no other skin lesions are seen. Wound #2 status is Open. Original cause of wound was Trauma. The wound is located on the Left,Anterior Lower Leg. The wound measures 3.8cm length x 2.5cm width x 0.1cm depth; 7.461cm^2 area and 0.746cm^3 volume. The wound is limited to skin breakdown. There is no tunneling or undermining noted. There is a small amount of serosanguineous drainage noted. The wound margin is indistinct  and nonvisible. There is large (67-100%) granulation within the wound bed. There is a small (1-33%) amount of necrotic tissue  within the wound bed including Adherent Slough. Assessment Active Problems ICD-10 Contusion of right lower leg, subsequent encounter Chronic venous hypertension (idiopathic) with inflammation of right lower extremity [name], [name] ([id number]) Procedures Wound #2 Pre-procedure diagnosis of Wound #2 is a Trauma, Other located on the Left,Anterior Lower Leg . There was a Three Layer Compression Therapy Procedure by Cornell Barman, RN. Post procedure Diagnosis Wound #2: Same as Pre-Procedure Plan Wound Cleansing: Wound #2 Left,Anterior Lower Leg: Dial antibacterial soap, wash wounds, rinse and pat dry prior to dressing wounds Anesthetic (add to Medication List): Wound #2 Left,Anterior Lower Leg: Topical Lidocaine 4% cream applied to wound bed prior to debridement (In Clinic Only). Primary Wound Dressing: Wound #2 Left,Anterior Lower Leg: Silver Alginate Secondary Dressing: Wound #2 Left,Anterior Lower Leg: ABD pad Dressing Change Frequency: Wound #2 Left,Anterior Lower Leg: Change dressing every week Follow-up Appointments: Wound #2 Left,Anterior Lower Leg: Return Appointment in 1 week. Nurse Visit as needed Edema Control: Wound #2 Left,Anterior Lower Leg: 3 Layer Compression System - Left Lower Extremity - unna to anchor Additional Orders / Instructions: Wound #2 Left,Anterior Lower Leg: Activity as tolerated 1. For now I am continuing with the same dressing which is silver alginate/ABDs/3 layer compression 2. I did bring up temporarily the conversation about either support stockings or 20/30 compression stockings. Per her daughter she is generally noncompliant with these Electronic Signature(s) Signed: 05/15/2019 4:51:07 PM By: Linton Ham MD Entered By: Linton Ham on 05/15/2019 14:05:51 [name], [name]  ([id number]) -------------------------------------------------------------------------------- SuperBill Details Patient Name: [name], [name] Date of Service: 05/15/2019 Medical Record Number: [id number] Patient Account Number: [id number] Date of Birth/Sex: 1924/02/12 (85 y.o. F) Treating RN: Cornell Barman Primary Care Provider: Paulita Cradle Other Clinician: Referring Provider: Paulita Cradle Treating Provider/Extender: Tito Dine in Treatment: 4 Diagnosis Coding ICD-10 Codes Code Description S80.12XD Contusion of left lower leg, subsequent encounter I87.322 Chronic venous hypertension (idiopathic) with inflammation of left lower extremity Facility Procedures CPT4 Code: [id number] Description: (Facility Use Only) [phone] - [facility] LT LEG Modifier: Quantity: 1 Physician Procedures CPT4 Code Description: [id number] [facility] - [facility] PHYS LEVEL 3 - EST PT ICD-10 Diagnosis Description S80.12XD Contusion of left lower leg, subsequent encounter I87.322 Chronic venous hypertension (idiopathic) with inflammation of Modifier: left lower extre Quantity: 1 mity Electronic Signature(s) Signed: 05/15/2019 4:51:07 PM By: Linton Ham MD Entered By: Linton Ham on 05/15/2019 14:06:13

## 2019-05-15 NOTE — Progress Notes (Signed)
Eidson, Ashlyne C. ([id number]) Visit Report for 05/15/2019 Arrival Information Details Patient Name: Cristina [name], [name] Date of Service: 05/15/2019 1:30 PM Medical Record Number: [id number] Patient Account Number: [id number] Date of Birth/Sex: [birth year] (85 y.o. F) Treating RN: Army Melia Primary Care Katrenia Alkins: Paulita Cradle Other Clinician: Referring Tedrick Port: Paulita Cradle Treating Elya Diloreto/Extender: Tito Dine in Treatment: 4 Visit Information History Since Last Visit Added or deleted any medications: No Patient Arrived: Walker Any new allergies or adverse reactions: No Arrival Time: 13:37 Had a fall or experienced change in No Accompanied By: daughter activities of daily living that may affect Transfer Assistance: None risk of falls: Patient Identification Verified: Yes Signs or symptoms of abuse/neglect since last visito No Patient Has Alerts: Yes Hospitalized since last visit: No Patient Alerts: Patient on Blood Thinner Has Dressing in Place as Prescribed: Yes Pt taking plavix Pain Present Now: No Electronic Signature(s) Signed: 05/15/2019 4:21:29 PM By: Army Melia Entered By: Army Melia on 05/15/2019 13:38:05 Cristina [name], [name] ([id number]) -------------------------------------------------------------------------------- Compression Therapy Details Patient Name: [name], [name] Date of Service: 05/15/2019 1:30 PM Medical Record Number: [id number] Patient Account Number: [id number] Date of Birth/Sex: [birth year] (85 y.o. F) Treating RN: Cornell Barman Primary Care Jhordan Mckibben: Paulita Cradle Other Clinician: Referring Marketia Stallsmith: Paulita Cradle Treating Olamae Ferrara/Extender: Tito Dine in Treatment: 4 Compression Therapy Performed for Wound Assessment: Wound #2 Left,Anterior Lower Leg Performed By: Clinician Cornell Barman, RN Compression Type: Three Layer Post Procedure Diagnosis Same as Pre-procedure Electronic  Signature(s) Signed: 05/15/2019 5:01:16 PM By: Gretta Cool, BSN, RN, CWS, Kim RN, BSN Entered By: Gretta Cool, BSN, RN, CWS, Kim on 05/15/2019 13:53:09 Cristina [name], Cristina [name] ([id number]) -------------------------------------------------------------------------------- Encounter Discharge Information Details Patient Name: [name], [name] Date of Service: 05/15/2019 1:30 PM Medical Record Number: [id number] Patient Account Number: [id number] Date of Birth/Sex: [birth year] (85 y.o. F) Treating RN: Cornell Barman Primary Care Aceson Labell: Paulita Cradle Other Clinician: Referring Annette Bertelson: Paulita Cradle Treating Kalila Adkison/Extender: Tito Dine in Treatment: 4 Encounter Discharge Information Items Discharge Condition: Stable Ambulatory Status: Ambulatory Discharge Destination: Home Transportation: Private Auto Accompanied By: daughter Schedule Follow-up Appointment: Yes Clinical Summary of Care: Electronic Signature(s) Signed: 05/15/2019 5:01:16 PM By: Gretta Cool, BSN, RN, CWS, Kim RN, BSN Entered By: Gretta Cool, BSN, RN, CWS, Kim on 05/15/2019 13:56:08 Sidor, Cristina [name] ([id number]) -------------------------------------------------------------------------------- Lower Extremity Assessment Details Patient Name: [name], [name] Date of Service: 05/15/2019 1:30 PM Medical Record Number: [id number] Patient Account Number: [id number] Date of Birth/Sex: 1924-05-16 (85 y.o. F) Treating RN: Army Melia Primary Care Payzlee Ryder: Paulita Cradle Other Clinician: Referring Sommer Spickard: Paulita Cradle Treating Delante Karapetyan/Extender: Ricard Dillon Weeks in Treatment: 4 Edema Assessment Assessed: [Left: No] [Right: No] Edema: [Left: N] [Right: o] Calf Left: Right: Point of Measurement: 32 cm From Medial Instep 31 cm cm Ankle Left: Right: Point of Measurement: 10 cm From Medial Instep 18 cm cm Vascular Assessment Pulses: Dorsalis Pedis Palpable: [Left:Yes] Electronic Signature(s) Signed:  05/15/2019 4:21:29 PM By: Army Melia Entered By: Army Melia on 05/15/2019 13:47:48 Cristina [name], [name] ([id number]) -------------------------------------------------------------------------------- Multi Wound Chart Details Patient Name: [name], [name] Date of Service: 05/15/2019 1:30 PM Medical Record Number: [id number] Patient Account Number: [id number] Date of Birth/Sex: 02/14/1924 (85 y.o. F) Treating RN: Cornell Barman Primary Care Shericka Johnstone: Paulita Cradle Other Clinician: Referring Kunta Hilleary: Paulita Cradle Treating Avrohom Mckelvin/Extender: Tito Dine in Treatment: 4 Vital Signs Height(in): 65 Pulse(bpm): 36 Weight(lbs): 110 Blood Pressure(mmHg): 98/45 Body Mass Index(BMI): 18 Temperature(F): 98.5 Respiratory Rate 16 (breaths/min): Photos: [N/A:N/A] Wound Location: Left Lower Leg - Anterior  N/A N/A Wounding Event: Trauma N/A N/A Primary Etiology: Trauma, Other N/A N/A Comorbid History: Cataracts, Arrhythmia, N/A N/A Congestive Heart Failure, End Stage Renal Disease Date Acquired: 04/14/2019 N/A N/A Weeks of Treatment: 4 N/A N/A Wound Status: Open N/A N/A Measurements L x W x D 3.8x2.5x0.1 N/A N/A (cm) Area (cm) : 7.461 N/A N/A Volume (cm) : 0.746 N/A N/A % Reduction in Area: 45.70% N/A N/A % Reduction in Volume: 45.70% N/A N/A Classification: Partial Thickness N/A N/A Exudate Amount: Small N/A N/A Exudate Type: Serosanguineous N/A N/A Exudate Color: red, brown N/A N/A Wound Margin: Indistinct, nonvisible N/A N/A Granulation Amount: Large (67-100%) N/A N/A Necrotic Amount: Small (1-33%) N/A N/A Exposed Structures: Fascia: No N/A N/A Fat Layer (Subcutaneous Tissue) Exposed: No Tendon: No Muscle: No Joint: No Cristina [name], [name] ([id number]) Bone: No Limited to Skin Breakdown Epithelialization: None N/A N/A Procedures Performed: Compression Therapy N/A N/A Treatment Notes Wound #2 (Left, Anterior Lower Leg) Notes Silver cell, ABD,  3-Layer Left, unna to anchor Electronic Signature(s) Signed: 05/15/2019 4:51:07 PM By: Linton Ham MD Entered By: Linton Ham on 05/15/2019 13:58:41 Cristina [name], [name] ([id number]) -------------------------------------------------------------------------------- Multi-Disciplinary Care Plan Details Patient Name: Cristina [name], [name] Date of Service: 05/15/2019 1:30 PM Medical Record Number: [id number] Patient Account Number: [id number] Date of Birth/Sex: 1923-11-30 (85 y.o. F) Treating RN: Cornell Barman Primary Care Edouard Gikas: Paulita Cradle Other Clinician: Referring Emet Rafanan: Paulita Cradle Treating Sylvia Kondracki/Extender: Tito Dine in Treatment: 4 Active Inactive Abuse / Safety / Falls / Self Care Management Nursing Diagnoses: Potential for falls Goals: Patient will remain injury free related to falls Date Initiated: 04/17/2019 Target Resolution Date: 05/24/2019 Goal Status: Active Interventions: Assess fall risk on admission and as needed Notes: Orientation to the Wound Care Program Nursing Diagnoses: Knowledge deficit related to the wound healing center program Goals: Patient/caregiver will verbalize understanding of the [facility] Program Date Initiated: 04/17/2019 Target Resolution Date: 05/24/2019 Goal Status: Active Interventions: Provide education on orientation to the wound center Notes: Wound/Skin Impairment Nursing Diagnoses: Impaired tissue integrity Goals: Ulcer/skin breakdown will have a volume reduction of 30% by week 4 Date Initiated: 04/17/2019 Target Resolution Date: 05/17/2019 Goal Status: Active Interventions: Assess ulceration(s) every visit Cristina [name], [name] ([id number]) Treatment Activities: Skin care regimen initiated : 04/17/2019 Notes: Electronic Signature(s) Signed: 05/15/2019 5:01:16 PM By: Gretta Cool, BSN, RN, CWS, Kim RN, BSN Entered By: Gretta Cool, BSN, RN, CWS, Kim on 05/15/2019 13:52:40 Cristina [name], [name]  ([id number]) -------------------------------------------------------------------------------- Pain Assessment Details Patient Name: Ebanks, Chelby C. Date of Service: 05/15/2019 1:30 PM Medical Record Number: [id number] Patient Account Number: [id number] Date of Birth/Sex: 1924-04-30 (85 y.o. F) Treating RN: Army Melia Primary Care Ibn Stief: Paulita Cradle Other Clinician: Referring Candiss Galeana: Paulita Cradle Treating Castin Donaghue/Extender: Tito Dine in Treatment: 4 Active Problems Location of Pain Severity and Description of Pain Patient Has Paino No Site Locations Pain Management and Medication Current Pain Management: Electronic Signature(s) Signed: 05/15/2019 4:21:29 PM By: Army Melia Entered By: Army Melia on 05/15/2019 13:38:14 Cristina [name], [name] ([id number]) -------------------------------------------------------------------------------- Patient/Caregiver Education Details Patient Name: Goetsch, Sabeen C. Date of Service: 05/15/2019 1:30 PM Medical Record Number: [id number] Patient Account Number: [id number] Date of Birth/Gender: 11/21/1923 (85 y.o. F) Treating RN: Cornell Barman Primary Care Physician: Paulita Cradle Other Clinician: Referring Physician: Paulita Cradle Treating Physician/Extender: Tito Dine in Treatment: 4 Education Assessment Education Provided To: Patient Education Topics Provided Wound/Skin Impairment: Handouts: Caring for Your Ulcer Methods: Demonstration, Explain/Verbal Responses: State content correctly Electronic Signature(s) Signed: 05/15/2019 5:01:16 PM By:  Gretta Cool, BSN, RN, CWS, Kim RN, BSN Entered By: Gretta Cool, BSN, RN, CWS, Kim on 05/15/2019 13:54:26 Cristina [name], Cristina [name] ([id number]) -------------------------------------------------------------------------------- Wound Assessment Details Patient Name: Cristina [name], [name] Date of Service: 05/15/2019 1:30 PM Medical Record Number: [id number] Patient Account  Number: [id number] Date of Birth/Sex: [birth year] (85 y.o. F) Treating RN: Army Melia Primary Care Davan Hark: Paulita Cradle Other Clinician: Referring Damarion Mendizabal: Paulita Cradle Treating Emunah Texidor/Extender: Tito Dine in Treatment: 4 Wound Status Wound Number: 2 Primary Trauma, Other Etiology: Wound Location: Left Lower Leg - Anterior Wound Open Wounding Event: Trauma Status: Date Acquired: 04/14/2019 Comorbid Cataracts, Arrhythmia, Congestive Heart Weeks Of Treatment: 4 History: Failure, End Stage Renal Disease Clustered Wound: No Photos Wound Measurements Length: (cm) 3.8 Width: (cm) 2.5 Depth: (cm) 0.1 Area: (cm) 7.461 Volume: (cm) 0.746 % Reduction in Area: 45.7% % Reduction in Volume: 45.7% Epithelialization: None Tunneling: No Undermining: No Wound Description Classification: Partial Thickness Foul Odor Wound Margin: Indistinct, nonvisible Slough/Fib Exudate Amount: Small Exudate Type: Serosanguineous Exudate Color: red, brown After Cleansing: No rino No Wound Bed Granulation Amount: Large (67-100%) Exposed Structure Necrotic Amount: Small (1-33%) Fascia Exposed: No Necrotic Quality: Adherent Slough Fat Layer (Subcutaneous Tissue) Exposed: No Tendon Exposed: No Muscle Exposed: No Joint Exposed: No Bone Exposed: No Limited to Skin Breakdown Treatment Notes Cristina [name], Keyera C. ([id number]) Wound #2 (Left, Anterior Lower Leg) Notes Silver cell, ABD, 3-Layer Left, unna to anchor Electronic Signature(s) Signed: 05/15/2019 4:21:29 PM By: Army Melia Entered By: Army Melia on 05/15/2019 13:46:05 Dier, Zailyn C. ([id number]) -------------------------------------------------------------------------------- Vitals Details Patient Name: Traxler, Lashandra C. Date of Service: 05/15/2019 1:30 PM Medical Record Number: [id number] Patient Account Number: [id number] Date of Birth/Sex: 01/07/1924 (85 y.o. F) Treating RN: Army Melia Primary Care  Waris Rodger: Paulita Cradle Other Clinician: Referring Marycruz Boehner: Paulita Cradle Treating Shlome Baldree/Extender: Tito Dine in Treatment: 4 Vital Signs Time Taken: 13:38 Temperature (F): 98.5 Height (in): 65 Pulse (bpm): 58 Weight (lbs): 110 Respiratory Rate (breaths/min): 16 Body Mass Index (BMI): 18.3 Blood Pressure (mmHg): 98/45 Reference Range: 80 - 120 mg / dl Electronic Signature(s) Signed: 05/15/2019 4:21:29 PM By: Army Melia Entered By: Army Melia on 05/15/2019 13:39:13

## 2019-05-22 ENCOUNTER — Encounter: Payer: Medicare Other | Attending: Internal Medicine | Admitting: Internal Medicine

## 2019-05-22 ENCOUNTER — Other Ambulatory Visit: Payer: Self-pay

## 2019-05-22 DIAGNOSIS — Z7901 Long term (current) use of anticoagulants: Secondary | ICD-10-CM | POA: Diagnosis not present

## 2019-05-22 DIAGNOSIS — Z8249 Family history of ischemic heart disease and other diseases of the circulatory system: Secondary | ICD-10-CM | POA: Insufficient documentation

## 2019-05-22 DIAGNOSIS — Z7902 Long term (current) use of antithrombotics/antiplatelets: Secondary | ICD-10-CM | POA: Insufficient documentation

## 2019-05-22 DIAGNOSIS — X58XXXD Exposure to other specified factors, subsequent encounter: Secondary | ICD-10-CM | POA: Insufficient documentation

## 2019-05-22 DIAGNOSIS — N186 End stage renal disease: Secondary | ICD-10-CM | POA: Insufficient documentation

## 2019-05-22 DIAGNOSIS — S8011XD Contusion of right lower leg, subsequent encounter: Secondary | ICD-10-CM | POA: Insufficient documentation

## 2019-05-22 DIAGNOSIS — Z8673 Personal history of transient ischemic attack (TIA), and cerebral infarction without residual deficits: Secondary | ICD-10-CM | POA: Insufficient documentation

## 2019-05-22 DIAGNOSIS — I4891 Unspecified atrial fibrillation: Secondary | ICD-10-CM | POA: Insufficient documentation

## 2019-05-22 DIAGNOSIS — I350 Nonrheumatic aortic (valve) stenosis: Secondary | ICD-10-CM | POA: Diagnosis not present

## 2019-05-22 DIAGNOSIS — I872 Venous insufficiency (chronic) (peripheral): Secondary | ICD-10-CM | POA: Insufficient documentation

## 2019-05-22 DIAGNOSIS — I132 Hypertensive heart and chronic kidney disease with heart failure and with stage 5 chronic kidney disease, or end stage renal disease: Secondary | ICD-10-CM | POA: Diagnosis not present

## 2019-05-22 DIAGNOSIS — I509 Heart failure, unspecified: Secondary | ICD-10-CM | POA: Diagnosis not present

## 2019-05-22 DIAGNOSIS — S8012XD Contusion of left lower leg, subsequent encounter: Secondary | ICD-10-CM | POA: Insufficient documentation

## 2019-05-22 NOTE — Progress Notes (Signed)
Burford, Kinzie C. ([id number]) Visit Report for 05/22/2019 HPI Details Patient Name: Cristina [name], [name] Date of Service: 05/22/2019 1:30 PM Medical Record Number: [id number] Patient Account Number: [id number] Date of Birth/Sex: 02/26/1924 (85 y.o. F) Treating RN: Cornell Barman Primary Care Provider: Paulita Cradle Other Clinician: Referring Provider: Paulita Cradle Treating Provider/Extender: Tito Dine in Treatment: 5 History of Present Illness HPI Description: 08/06/18 on evaluation today patient actually presents for initial evaluation concerning a right anterior lower extremity ulcer which has been present for 4-5 months. She is a very poor historian really does not know if she tripped or how in fact she fell and injured this area. Upon further questioning it appears she actually has dimension I will explain a scenario such as how to perform the dressing changes and then she will ask me questions such as how often it should be done when I very specifically pointed this out to her. I definitely think that there is a strong component of dementia here leading to her poor history. She does have a history of atrial fibrillation as will a stroke and is on blood thinners chronically for this. She also has stage IV chronic kidney disease. Patient's wound over the anterior lower extremity of the right leg actually show signs of being completely eschar covered upon my initial evaluation today. No fevers, chills, nausea, or vomiting noted at this time. 08/20/18 on evaluation today patient's wound actually appears to be doing significantly better which is excellent news. She has been having her daughter change the dressing's which I think has worked out great for her. With that being said we had previously ordered home health although they never actually got started simply due to the fact that I was concerned about the patient's dimension of her being able to actually perform the  dressing changes appropriately of her own accord. Nonetheless with her daughter taking care of this for her I see no issues and they seem to be doing excellent at this point. 09/03/18 on evaluation today patient's wound actually appears to be doing much better which is good news. With that being said she did initially have a lot of dry drainage on the surface of the wound. This is subsequently due to the fact that on Friday patient's daughter [name] she will move the dressing to clean the area and then forgot to reapply the dressing before she left her mother's home. She subsequently called her father to get him to apply it but when she picked her mother up today to bring her to the appointment this still did not have a dressing on it. Nonetheless things do not appear to be doing too poorly which is good news overall I do feel like she's showing signs of improvement. 09/10/18 on evaluation today patient's wound actually appears to be completely healed which is excellent news. Overall she's been tolerating the dressing changes without complication. READMISSION 04/17/2019 This is a 85 year old woman who has a history of a wound on her right anterior lower extremity of uncertain etiology in late 2019 early 2020. She was seen in this clinic and healed out with standard care. She has chronic venous insufficiency. She does not wear stockings. She is not a diabetic. Noticed by her husband to have a fairly large hematoma on the left anterior lower leg. They are here for our review of this. She is on Plavix and aspirin presumably for prophylaxis and atrial fibrillation. They have not been doing anything to the wound. The patient has a  history of atrial fibrillation on Plavix and aspirin, dementia for which she has not had an evaluation history of TIA/CVA, stage IV chronic renal failure, aortic stenosis. We did not do ABIs because of location of the area. 9/16; the patient came in with the hematoma still  largely intact which was surprising however with gentle palpation it was also noted to be leaking inferiorly. Using pickups and scissors I remove this denuded skin on the top. Gauze and saline to remove the gelatinous hematoma and finally debrided the wound surface. We will use silver alginate 9/23; the hematoma was evacuated last week on the left anterior lower extremity. Wound was debrided. We use silver Thalman, Edwin C. ([id number]) alginate under compression. Arrives today with a fair amount of bleeding extending into the secondary dressings nevertheless the wound looks quite good healthy tissue and measuring smaller 9/30; wound on the left anterior lower leg. Measures smaller. Surface of this is healthy. The patient has severe venous hypertension but does not wear stockings. Stockings have been suggested in the past she does not comply with them. We have been using silver alginate under 3 layer compression 10/7; wound on the left anterior leg. Measuring smaller. Surface of this looks healthy. We have been using silver alginate under 3 layer compression Electronic Signature(s) Signed: 05/22/2019 5:14:46 PM By: Linton Ham MD Entered By: Linton Ham on 05/22/2019 14:34:19 Mcquillen, Tashianna C. ([id number]) -------------------------------------------------------------------------------- Physical Exam Details Patient Name: Crowell, Jameya C. Date of Service: 05/22/2019 1:30 PM Medical Record Number: [id number] Patient Account Number: [id number] Date of Birth/Sex: 05-25-1924 (85 y.o. F) Treating RN: Cornell Barman Primary Care Provider: Paulita Cradle Other Clinician: Referring Provider: Paulita Cradle Treating Provider/Extender: Ricard Dillon Weeks in Treatment: 5 Constitutional Patient is hypotensive. However she looks well. Pulse regular and within target range for patient.Marland Kitchen Respirations regular, non- labored and within target range.. Temperature is normal and within the  target range for the patient.Marland Kitchen appears in no distress. Eyes Conjunctivae clear. No discharge. Respiratory Respiratory effort is easy and symmetric bilaterally. Rate is normal at rest and on room air.. Cardiovascular Pedal pulses palpable and strong bilaterally.. Evidence of severe venous hypertension. Integumentary (Hair, Skin) Significant hemosiderin deposition. Psychiatric No evidence of depression, anxiety, or agitation. Calm, cooperative, and communicative. Appropriate interactions and affect.. Notes Wound exam; right anterior lower tibia smaller. Some surface debris however as the wound dimensions have improved no debridement Electronic Signature(s) Signed: 05/22/2019 5:14:46 PM By: Linton Ham MD Entered By: Linton Ham on 05/22/2019 14:37:07 Fabry, Wilba C. ([id number]) -------------------------------------------------------------------------------- Physician Orders Details Patient Name: Dizdarevic, Novalie C. Date of Service: 05/22/2019 1:30 PM Medical Record Number: [id number] Patient Account Number: [id number] Date of Birth/Sex: 1924/06/20 (85 y.o. F) Treating RN: Cornell Barman Primary Care Provider: Paulita Cradle Other Clinician: Referring Provider: Paulita Cradle Treating Provider/Extender: Tito Dine in Treatment: 5 Verbal / Phone Orders: No Diagnosis Coding Wound Cleansing Wound #2 Left,Anterior Lower Leg o Dial antibacterial soap, wash wounds, rinse and pat dry prior to dressing wounds Anesthetic (add to Medication List) Wound #2 Left,Anterior Lower Leg o Topical Lidocaine 4% cream applied to wound bed prior to debridement (In Clinic Only). Primary Wound Dressing Wound #2 Left,Anterior Lower Leg o Silver Alginate Secondary Dressing Wound #2 Left,Anterior Lower Leg o ABD pad Dressing Change Frequency Wound #2 Left,Anterior Lower Leg o Change dressing every week Follow-up Appointments Wound #2 Left,Anterior Lower Leg o  Return Appointment in 1 week. o Nurse Visit as needed Edema Control Wound #2 Left,Anterior Lower Leg o 3  Layer Compression System - Left Lower Extremity - unna to anchor Additional Orders / Instructions Wound #2 Left,Anterior Lower Leg o Activity as tolerated Electronic Signature(s) Signed: 05/22/2019 5:14:46 PM By: Linton Ham MD Signed: 05/22/2019 5:19:46 PM By: Gretta Cool, BSN, RN, CWS, Kim RN, BSN Entered By: Gretta Cool, BSN, RN, CWS, Kim on 05/22/2019 14:17:57 Borak, Lynniah C. ([id number]) -------------------------------------------------------------------------------- Problem List Details Patient Name: Schimpf, Chaye C. Date of Service: 05/22/2019 1:30 PM Medical Record Number: [id number] Patient Account Number: [id number] Date of Birth/Sex: 29-Nov-1923 (85 y.o. F) Treating RN: Cornell Barman Primary Care Provider: Paulita Cradle Other Clinician: Referring Provider: Paulita Cradle Treating Provider/Extender: Tito Dine in Treatment: 5 Active Problems ICD-10 Evaluated Encounter Code Description Active Date Today Diagnosis S80.11XD Contusion of right lower leg, subsequent encounter 04/17/2019 No Yes I87.321 Chronic venous hypertension (idiopathic) with inflammation of 04/17/2019 No Yes right lower extremity Inactive Problems Resolved Problems Electronic Signature(s) Signed: 05/22/2019 5:14:46 PM By: Linton Ham MD Entered By: Linton Ham on 05/22/2019 14:33:42 Rockers, Dejah C. ([id number]) -------------------------------------------------------------------------------- Progress Note Details Patient Name: Terlecki, Aoife C. Date of Service: 05/22/2019 1:30 PM Medical Record Number: [id number] Patient Account Number: [id number] Date of Birth/Sex: [birth year] (85 y.o. F) Treating RN: Cornell Barman Primary Care Provider: Paulita Cradle Other Clinician: Referring Provider: Paulita Cradle Treating Provider/Extender: Tito Dine in  Treatment: 5 Subjective History of Present Illness (HPI) 08/06/18 on evaluation today patient actually presents for initial evaluation concerning a right anterior lower extremity ulcer which has been present for 4-5 months. She is a very poor historian really does not know if she tripped or how in fact she fell and injured this area. Upon further questioning it appears she actually has dimension I will explain a scenario such as how to perform the dressing changes and then she will ask me questions such as how often it should be done when I very specifically pointed this out to her. I definitely think that there is a strong component of dementia here leading to her poor history. She does have a history of atrial fibrillation as will a stroke and is on blood thinners chronically for this. She also has stage IV chronic kidney disease. Patient's wound over the anterior lower extremity of the right leg actually show signs of being completely eschar covered upon my initial evaluation today. No fevers, chills, nausea, or vomiting noted at this time. 08/20/18 on evaluation today patient's wound actually appears to be doing significantly better which is excellent news. She has been having her daughter change the dressing's which I think has worked out great for her. With that being said we had previously ordered home health although they never actually got started simply due to the fact that I was concerned about the patient's dimension of her being able to actually perform the dressing changes appropriately of her own accord. Nonetheless with her daughter taking care of this for her I see no issues and they seem to be doing excellent at this point. 09/03/18 on evaluation today patient's wound actually appears to be doing much better which is good news. With that being said she did initially have a lot of dry drainage on the surface of the wound. This is subsequently due to the fact that on Friday patient's  daughter [name] she will move the dressing to clean the area and then forgot to reapply the dressing before she left her mother's home. She subsequently called her father to get him to apply it but when she picked her mother  up today to bring her to the appointment this still did not have a dressing on it. Nonetheless things do not appear to be doing too poorly which is good news overall I do feel like she's showing signs of improvement. 09/10/18 on evaluation today patient's wound actually appears to be completely healed which is excellent news. Overall she's been tolerating the dressing changes without complication. READMISSION 04/17/2019 This is a 85 year old woman who has a history of a wound on her right anterior lower extremity of uncertain etiology in late 2019 early 2020. She was seen in this clinic and healed out with standard care. She has chronic venous insufficiency. She does not wear stockings. She is not a diabetic. Noticed by her husband to have a fairly large hematoma on the left anterior lower leg. They are here for our review of this. She is on Plavix and aspirin presumably for prophylaxis and atrial fibrillation. They have not been doing anything to the wound. The patient has a history of atrial fibrillation on Plavix and aspirin, dementia for which she has not had an evaluation history of TIA/CVA, stage IV chronic renal failure, aortic stenosis. We did not do ABIs because of location of the area. 9/16; the patient came in with the hematoma still largely intact which was surprising however with gentle palpation it was also noted to be leaking inferiorly. Using pickups and scissors I remove this denuded skin on the top. Gauze and saline to remove the gelatinous hematoma and finally debrided the wound surface. We will use silver alginate 9/23; the hematoma was evacuated last week on the left anterior lower extremity. Wound was debrided. We use silver alginate under compression.  Arrives today with a fair amount of bleeding extending into the secondary dressings nevertheless the wound looks quite good healthy tissue and measuring smaller 9/30; wound on the left anterior lower leg. Measures smaller. Surface of this is healthy. The patient has severe venous Freda, Franziska C. ([id number]) hypertension but does not wear stockings. Stockings have been suggested in the past she does not comply with them. We have been using silver alginate under 3 layer compression 10/7; wound on the left anterior leg. Measuring smaller. Surface of this looks healthy. We have been using silver alginate under 3 layer compression Objective Constitutional Patient is hypotensive. However she looks well. Pulse regular and within target range for patient.Marland Kitchen Respirations regular, non- labored and within target range.. Temperature is normal and within the target range for the patient.Marland Kitchen appears in no distress. Vitals Time Taken: 1:41 PM, Height: 65 in, Weight: 110 lbs, BMI: 18.3, Temperature: 98.4 F, Pulse: 65 bpm, Respiratory Rate: 16 breaths/min, Blood Pressure: 91/67 mmHg. Eyes Conjunctivae clear. No discharge. Respiratory Respiratory effort is easy and symmetric bilaterally. Rate is normal at rest and on room air.. Cardiovascular Pedal pulses palpable and strong bilaterally.. Evidence of severe venous hypertension. Psychiatric No evidence of depression, anxiety, or agitation. Calm, cooperative, and communicative. Appropriate interactions and affect.. General Notes: Wound exam; right anterior lower tibia smaller. Some surface debris however as the wound dimensions have improved no debridement Integumentary (Hair, Skin) Significant hemosiderin deposition. Wound #2 status is Open. Original cause of wound was Trauma. The wound is located on the Left,Anterior Lower Leg. The wound measures 3.4cm length x 1.9cm width x 0.1cm depth; 5.074cm^2 area and 0.507cm^3 volume. There is Fat  Layer (Subcutaneous Tissue) Exposed exposed. There is no tunneling or undermining noted. There is a small amount of sanguinous drainage noted. The wound margin is indistinct and  nonvisible. There is large (67-100%) granulation within the wound bed. There is a small (1-33%) amount of necrotic tissue within the wound bed including Adherent Slough. Assessment Active Problems ICD-10 Contusion of right lower leg, subsequent encounter Chronic venous hypertension (idiopathic) with inflammation of right lower extremity Newman, Keeanna C. ([id number]) Plan Wound Cleansing: Wound #2 Left,Anterior Lower Leg: Dial antibacterial soap, wash wounds, rinse and pat dry prior to dressing wounds Anesthetic (add to Medication List): Wound #2 Left,Anterior Lower Leg: Topical Lidocaine 4% cream applied to wound bed prior to debridement (In Clinic Only). Primary Wound Dressing: Wound #2 Left,Anterior Lower Leg: Silver Alginate Secondary Dressing: Wound #2 Left,Anterior Lower Leg: ABD pad Dressing Change Frequency: Wound #2 Left,Anterior Lower Leg: Change dressing every week Follow-up Appointments: Wound #2 Left,Anterior Lower Leg: Return Appointment in 1 week. Nurse Visit as needed Edema Control: Wound #2 Left,Anterior Lower Leg: 3 Layer Compression System - Left Lower Extremity - unna to anchor Additional Orders / Instructions: Wound #2 Left,Anterior Lower Leg: Activity as tolerated 1. Left anterior lower leg. She she continues to make nice progress. Silver alginate/ABDs under 3 layer compression 2. At this point no plans to change the dressing unless the wound healing's stalls Electronic Signature(s) Signed: 05/22/2019 5:14:46 PM By: Linton Ham MD Entered By: Linton Ham on 05/22/2019 14:38:16 Hildreth, Yasemin C. ([id number]) -------------------------------------------------------------------------------- SuperBill Details Patient Name: Bocchino, Athene C. Date of Service:  05/22/2019 Medical Record Number: [id number] Patient Account Number: [id number] Date of Birth/Sex: September 21, 1923 (85 y.o. F) Treating RN: Cornell Barman Primary Care Provider: Paulita Cradle Other Clinician: Referring Provider: Paulita Cradle Treating Provider/Extender: Tito Dine in Treatment: 5 Diagnosis Coding ICD-10 Codes Code Description S80.12XD Contusion of left lower leg, subsequent encounter I87.322 Chronic venous hypertension (idiopathic) with inflammation of left lower extremity Facility Procedures CPT4 Code: [id number] Description: (Facility Use Only) [id number] - [facility] LT LEG Modifier: Quantity: 1 Physician Procedures CPT4 Code Description: [id number] [facility] - WC PHYS LEVEL 3 - EST PT ICD-10 Diagnosis Description S80.12XD Contusion of left lower leg, subsequent encounter I87.322 Chronic venous hypertension (idiopathic) with inflammation of Modifier: left lower extre Quantity: 1 mity Electronic Signature(s) Signed: 05/22/2019 5:14:46 PM By: Linton Ham MD Entered By: Linton Ham on 05/22/2019 14:38:37

## 2019-05-22 NOTE — Progress Notes (Signed)
[name], [name] ([id number]) Visit Report for 05/22/2019 Arrival Information Details Patient Name: Cristina [name], [name] Date of Service: 05/22/2019 1:30 PM Medical Record Number: [id number] Patient Account Number: [id number] Date of Birth/Sex: [birth year] (85 y.o. F) Treating RN: Cristina [name] Primary Care Cristina [name]: Cristina [name] Other Clinician: Referring Cristina [name]: Cristina [name] Treating Cristina [name]/Extender: Cristina [name] in Treatment: 5 Visit Information History Since Last Visit Added or deleted any medications: No Patient Arrived: Walker Any new allergies or adverse reactions: No Arrival Time: 13:38 Had a fall or experienced change in No Accompanied By: daughter activities of daily living that may affect Transfer Assistance: None risk of falls: Patient Identification Verified: Yes Signs or symptoms of abuse/neglect since last visito No Secondary Verification Process Yes Hospitalized since last visit: No Completed: Implantable device outside of the clinic excluding No Patient Has Alerts: Yes cellular tissue based products placed in the center Patient Alerts: Patient on Blood since last visit: Thinner Has Dressing in Place as Prescribed: Yes Pt taking plavix Has Compression in Place as Prescribed: Yes Pain Present Now: No Electronic Signature(s) Signed: 05/22/2019 4:42:21 PM By: Cristina [name] Entered By: Cristina [name] on 05/22/2019 13:38:36 Cristina [name], [name] ([id number]) -------------------------------------------------------------------------------- Encounter Discharge Information Details Patient Name: Cristina [name], [name] Date of Service: 05/22/2019 1:30 PM Medical Record Number: [id number] Patient Account Number: [id number] Date of Birth/Sex: [birth year] (85 y.o. F) Treating RN: Cristina [name] Primary Care Susannah Carbin: Cristina [name] Other Clinician: Referring Cristina [name]: Cristina [name] Treating Cristina [name]/Extender: Cristina [name] in  Treatment: 5 Encounter Discharge Information Items Discharge Condition: Stable Ambulatory Status: Ambulatory Discharge Destination: Home Transportation: Private Auto Accompanied By: daughter Schedule Follow-up Appointment: Yes Clinical Summary of Care: Electronic Signature(s) Signed: 05/22/2019 5:19:46 PM By: Cristina [name], BSN, RN, CWS, Kim RN, BSN Entered By: Cristina [name], BSN, RN, CWS, Cristina [name] on 05/22/2019 14:19:14 [name], [name] ([id number]) -------------------------------------------------------------------------------- Lower Extremity Assessment Details Patient Name: Cristina [name], [name] Date of Service: 05/22/2019 1:30 PM Medical Record Number: [id number] Patient Account Number: [id number] Date of Birth/Sex: 1924-04-08 (85 y.o. F) Treating RN: Cristina [name] Primary Care Aritha Huckeba: Cristina [name] Other Clinician: Referring Pattijo Juste: Cristina [name] Treating [place] Every/Extender: Cristina [name] in Treatment: 5 Edema Assessment Assessed: [Left: No] [Right: No] Edema: [Left: N] [Right: o] Calf Left: Right: Point of Measurement: 32 cm From Medial Instep 31 cm cm Ankle Left: Right: Point of Measurement: 10 cm From Medial Instep 18.5 cm cm Vascular Assessment Pulses: Dorsalis Pedis Palpable: [Left:Yes] Electronic Signature(s) Signed: 05/22/2019 4:42:21 PM By: Cristina [name] Entered By: Cristina [name] on 05/22/2019 13:45:19 [name], [name] ([id number]) -------------------------------------------------------------------------------- Multi Wound Chart Details Patient Name: Cristina [name], [name] Date of Service: 05/22/2019 1:30 PM Medical Record Number: [id number] Patient Account Number: [id number] Date of Birth/Sex: [birth year] (85 y.o. F) Treating RN: Cristina [name] Primary Care Yalonda Sample: Cristina [name] Other Clinician: Referring Jenayah Antu: Cristina [name] Treating Quanna Wittke/Extender: Cristina [name] in Treatment: 5 Vital Signs Height(in): 65 Pulse(bpm):  65 Weight(lbs): 110 Blood Pressure(mmHg): 91/67 Body Mass Index(BMI): 18 Temperature(F): 98.4 Respiratory Rate 16 (breaths/min): Photos: [N/A:N/A] Wound Location: Left Lower Leg - Anterior N/A N/A Wounding Event: Trauma N/A N/A Primary Etiology: Trauma, Other N/A N/A Comorbid History: Cataracts, Arrhythmia, N/A N/A Congestive Heart Failure, End Stage Renal Disease Date Acquired: 04/14/2019 N/A N/A Weeks of Treatment: 5 N/A N/A Wound Status: Open N/A N/A Measurements L x W x D 3.4x1.9x0.1 N/A N/A (cm) Area (cm) : 5.074 N/A N/A Volume (cm) : 0.507 N/A N/A % Reduction in Area: 63.10% N/A N/A % Reduction in Volume: 63.10% N/A N/A  Classification: Full Thickness Without N/A N/A Exposed Support Structures Exudate Amount: Small N/A N/A Exudate Type: Sanguinous N/A N/A Exudate Color: red N/A N/A Wound Margin: Indistinct, nonvisible N/A N/A Granulation Amount: Large (67-100%) N/A N/A Necrotic Amount: Small (1-33%) N/A N/A Exposed Structures: Fat Layer (Subcutaneous N/A N/A Tissue) Exposed: Yes Fascia: No Tendon: No Muscle: No [name], [name] ([id number]) Joint: No Bone: No Epithelialization: Small (1-33%) N/A N/A Treatment Notes Wound #2 (Left, Anterior Lower Leg) Notes Silver cell, ABD, 3-Layer Left, unna to anchor Electronic Signature(s) Signed: 05/22/2019 5:14:46 PM By: Linton Ham MD Entered By: Linton Ham on 05/22/2019 14:33:50 [name], [name] ([id number]) -------------------------------------------------------------------------------- Multi-Disciplinary Care Plan Details Patient Name: Cristina [name], [name] Date of Service: 05/22/2019 1:30 PM Medical Record Number: [id number] Patient Account Number: [id number] Date of Birth/Sex: November 23, 1923 (85 y.o. F) Treating RN: Cristina [name] Primary Care Ramaya Guile: Cristina [name] Other Clinician: Referring Analeese Andreatta: Cristina [name] Treating Cristina [name]/Extender: Cristina [name] in Treatment: 5 Active  Inactive Abuse / Safety / Falls / Self Care Management Nursing Diagnoses: Potential for falls Goals: Patient will remain injury free related to falls Date Initiated: 04/17/2019 Target Resolution Date: 05/24/2019 Goal Status: Active Interventions: Assess fall risk on admission and as needed Notes: Orientation to the Wound Care Program Nursing Diagnoses: Knowledge deficit related to the wound healing center program Goals: Patient/caregiver will verbalize understanding of the [facility] Date Initiated: 04/17/2019 Target Resolution Date: 05/24/2019 Goal Status: Active Interventions: Provide education on orientation to the wound center Notes: Wound/Skin Impairment Nursing Diagnoses: Impaired tissue integrity Goals: Ulcer/skin breakdown will have a volume reduction of 30% by week 4 Date Initiated: 04/17/2019 Target Resolution Date: 05/17/2019 Goal Status: Active Interventions: Assess ulceration(s) every visit Cristina [name], [name] ([id number]) Treatment Activities: Skin care regimen initiated : 04/17/2019 Notes: Electronic Signature(s) Signed: 05/22/2019 5:19:46 PM By: Cristina [name], BSN, RN, CWS, Kim RN, BSN Entered By: Cristina [name], BSN, RN, CWS, Cristina [name] on 05/22/2019 14:17:06 Cristina [name], [name] ([id number]) -------------------------------------------------------------------------------- Pain Assessment Details Patient Name: Cristina [name], [name] Date of Service: 05/22/2019 1:30 PM Medical Record Number: [id number] Patient Account Number: [id number] Date of Birth/Sex: 06-18-1924 (85 y.o. F) Treating RN: Cristina [name] Primary Care Taiyo Kozma: Cristina [name] Other Clinician: Referring Vanessa Alesi: Cristina [name] Treating Trustin Chapa/Extender: Cristina [name] in Treatment: 5 Active Problems Location of Pain Severity and Description of Pain Patient Has Paino No Site Locations Pain Management and Medication Current Pain Management: Electronic Signature(s) Signed: 05/22/2019  4:42:21 PM By: Cristina [name] Entered By: Cristina [name] on 05/22/2019 13:38:42 Cristina [name], [name] ([id number]) -------------------------------------------------------------------------------- Patient/Caregiver Education Details Patient Name: Pantoja, Lessie C. Date of Service: 05/22/2019 1:30 PM Medical Record Number: [id number] Patient Account Number: [id number] Date of Birth/Gender: Sep 24, 1923 (85 y.o. F) Treating RN: Cristina [name] Primary Care Physician: Cristina [name] Other Clinician: Referring Physician: Paulita [name] Treating Physician/Extender: Cristina [name] in Treatment: 5 Education Assessment Education Provided To: Patient Education Topics Provided Venous: Handouts: Controlling Swelling with Multilayered Compression Wraps Methods: Demonstration, Explain/Verbal Responses: Return demonstration correctly, State content correctly Wound/Skin Impairment: Handouts: Caring for Your Ulcer Methods: Demonstration, Explain/Verbal Responses: State content correctly Electronic Signature(s) Signed: 05/22/2019 5:19:46 PM By: Cristina [name], BSN, RN, CWS, Kim RN, BSN Entered By: Cristina [name], BSN, RN, CWS, Cristina [name] on 05/22/2019 14:18:40 Cristina [name], [name] ([id number]) -------------------------------------------------------------------------------- Wound Assessment Details Patient Name: Cristina [name], [name] Date of Service: 05/22/2019 1:30 PM Medical Record Number: [id number] Patient Account Number: [id number] Date of Birth/Sex: [birth year] (85 y.o. F) Treating RN: Cristina [name] Primary Care Deandra Gadson: Cristina [name] Other Clinician: Referring Mckenzy Salazar: Cristina [name]  Treating Jerrel Tiberio/Extender: Ricard Dillon Weeks in Treatment: 5 Wound Status Wound Number: 2 Primary Trauma, Other Etiology: Wound Location: Left Lower Leg - Anterior Wound Open Wounding Event: Trauma Status: Date Acquired: 04/14/2019 Comorbid Cataracts, Arrhythmia, Congestive Heart Weeks Of Treatment:  5 History: Failure, End Stage Renal Disease Clustered Wound: No Photos Wound Measurements Length: (cm) 3.4 Width: (cm) 1.9 Depth: (cm) 0.1 Area: (cm) 5.074 Volume: (cm) 0.507 % Reduction in Area: 63.1% % Reduction in Volume: 63.1% Epithelialization: Small (1-33%) Tunneling: No Undermining: No Wound Description Full Thickness Without Exposed Support Foul Od Classification: Structures Slough/ Wound Margin: Indistinct, nonvisible Exudate Small Amount: Exudate Type: Sanguinous Exudate Color: red or After Cleansing: No Fibrino No Wound Bed Granulation Amount: Large (67-100%) Exposed Structure Necrotic Amount: Small (1-33%) Fascia Exposed: No Necrotic Quality: Adherent Slough Fat Layer (Subcutaneous Tissue) Exposed: Yes Tendon Exposed: No Muscle Exposed: No Joint Exposed: No Bone Exposed: No Cristina [name], [name] ([id number]) Treatment Notes Wound #2 (Left, Anterior Lower Leg) Notes Silver cell, ABD, 3-Layer Left, unna to anchor Electronic Signature(s) Signed: 05/22/2019 4:42:21 PM By: Cristina [name] Entered By: Cristina [name] on 05/22/2019 13:53:59 Cristina [name], Jewell C. ([id number]) -------------------------------------------------------------------------------- Vitals Details Patient Name: Anselmi, Alita C. Date of Service: 05/22/2019 1:30 PM Medical Record Number: [id number] Patient Account Number: [id number] Date of Birth/Sex: Dec 09, 1923 (85 y.o. F) Treating RN: Cristina [name] Primary Care Gladyce Mcray: Cristina [name] Other Clinician: Referring Jaedan Schuman: Cristina [name] Treating Eligha Kmetz/Extender: Cristina [name] in Treatment: 5 Vital Signs Time Taken: 13:41 Temperature (F): 98.4 Height (in): 65 Pulse (bpm): 65 Weight (lbs): 110 Respiratory Rate (breaths/min): 16 Body Mass Index (BMI): 18.3 Blood Pressure (mmHg): 91/67 Reference Range: 80 - 120 mg / dl Electronic Signature(s) Signed: 05/22/2019 4:42:21 PM By: Cristina [name] Entered By: Cristina [name] on 05/22/2019 13:42:04

## 2019-05-29 ENCOUNTER — Encounter: Payer: Medicare Other | Admitting: Internal Medicine

## 2019-05-29 ENCOUNTER — Other Ambulatory Visit: Payer: Self-pay

## 2019-05-29 DIAGNOSIS — S8011XD Contusion of right lower leg, subsequent encounter: Secondary | ICD-10-CM | POA: Diagnosis not present

## 2019-05-30 NOTE — Progress Notes (Signed)
Belmares, Eliese C. ([id number]) Visit Report for 05/29/2019 Debridement Details Patient Name: Cristina [name], [name] Date of Service: 05/29/2019 1:30 PM Medical Record Number: [id number] Patient Account Number: [id number] Date of Birth/Sex: [birth year] (85 y.o. F) Treating RN: Cornell Barman Primary Care Provider: Paulita Cradle Other Clinician: Referring Provider: Paulita Cradle Treating Provider/Extender: Tito Dine in Treatment: 6 Debridement Performed for Wound #2 Left,Anterior Lower Leg Assessment: Performed By: Physician Ricard Dillon, MD Debridement Type: Debridement Level of Consciousness (Pre- Awake and Alert procedure): Pre-procedure Verification/Time Yes - 13:55 Out Taken: Start Time: 13:55 Pain Control: Lidocaine Total Area Debrided (L x W): 2 (cm) x 1 (cm) = 2 (cm) Tissue and other material Non-Viable, Eschar debrided: Level: Non-Viable Tissue Debridement Description: Selective/Open Wound Instrument: Curette Bleeding: Minimum Hemostasis Achieved: Pressure End Time: 13:57 Post Procedural Pain: 0 Response to Treatment: Procedure was tolerated well Level of Consciousness Awake and Alert (Post-procedure): Post Debridement Measurements of Total Wound Length: (cm) 2 Width: (cm) 1 Depth: (cm) 0.1 Volume: (cm) 0.157 Character of Wound/Ulcer Post Debridement: Stable Post Procedure Diagnosis Same as Pre-procedure Electronic Signature(s) Signed: 05/29/2019 5:10:21 PM By: Linton Ham MD Signed: 05/29/2019 5:44:53 PM By: Gretta Cool, BSN, RN, CWS, Kim RN, BSN Entered By: Linton Ham on 05/29/2019 13:58:21 Schlarb, Shalaunda C. ([id number]) -------------------------------------------------------------------------------- HPI Details Patient Name: [name], [name] Date of Service: 05/29/2019 1:30 PM Medical Record Number: [id number] Patient Account Number: [id number] Date of Birth/Sex: [birth year] (85 y.o. F) Treating RN: Cornell Barman Primary  Care Provider: Paulita Cradle Other Clinician: Referring Provider: Paulita Cradle Treating Provider/Extender: Tito Dine in Treatment: 6 History of Present Illness HPI Description: 08/06/18 on evaluation today patient actually presents for initial evaluation concerning a right anterior lower extremity ulcer which has been present for 4-5 months. She is a very poor historian really does not know if she tripped or how in fact she fell and injured this area. Upon further questioning it appears she actually has dimension I will explain a scenario such as how to perform the dressing changes and then she will ask me questions such as how often it should be done when I very specifically pointed this out to her. I definitely think that there is a strong component of dementia here leading to her poor history. She does have a history of atrial fibrillation as will a stroke and is on blood thinners chronically for this. She also has stage IV chronic kidney disease. Patient's wound over the anterior lower extremity of the right leg actually show signs of being completely eschar covered upon my initial evaluation today. No fevers, chills, nausea, or vomiting noted at this time. 08/20/18 on evaluation today patient's wound actually appears to be doing significantly better which is excellent news. She has been having her daughter change the dressing's which I think has worked out great for her. With that being said we had previously ordered home health although they never actually got started simply due to the fact that I was concerned about the patient's dimension of her being able to actually perform the dressing changes appropriately of her own accord. Nonetheless with her daughter taking care of this for her I see no issues and they seem to be doing excellent at this point. 09/03/18 on evaluation today patient's wound actually appears to be doing much better which is good news. With that  being said she did initially have a lot of dry drainage on the surface of the wound. This is subsequently due to the fact that on  Friday patient's daughter [name] she will move the dressing to clean the area and then forgot to reapply the dressing before she left her mother's home. She subsequently called her father to get him to apply it but when she picked her mother up today to bring her to the appointment this still did not have a dressing on it. Nonetheless things do not appear to be doing too poorly which is good news overall I do feel like she's showing signs of improvement. 09/10/18 on evaluation today patient's wound actually appears to be completely healed which is excellent news. Overall she's been tolerating the dressing changes without complication. READMISSION 04/17/2019 This is a 85 year old woman who has a history of a wound on her right anterior lower extremity of uncertain etiology in late 2019 early 2020. She was seen in this clinic and healed out with standard care. She has chronic venous insufficiency. She does not wear stockings. She is not a diabetic. Noticed by her husband to have a fairly large hematoma on the left anterior lower leg. They are here for our review of this. She is on Plavix and aspirin presumably for prophylaxis and atrial fibrillation. They have not been doing anything to the wound. The patient has a history of atrial fibrillation on Plavix and aspirin, dementia for which she has not had an evaluation history of TIA/CVA, stage IV chronic renal failure, aortic stenosis. We did not do ABIs because of location of the area. 9/16; the patient came in with the hematoma still largely intact which was surprising however with gentle palpation it was also noted to be leaking inferiorly. Using pickups and scissors I remove this denuded skin on the top. Gauze and saline to remove the gelatinous hematoma and finally debrided the wound surface. We will use silver  alginate 9/23; the hematoma was evacuated last week on the left anterior lower extremity. Wound was debrided. We use silver alginate under compression. Arrives today with a fair amount of bleeding extending into the secondary dressings nevertheless the wound looks quite good healthy tissue and measuring smaller 9/30; wound on the left anterior lower leg. Measures smaller. Surface of this is healthy. The patient has severe venous hypertension but does not wear stockings. Stockings have been suggested in the past she does not comply with them. We Kozikowski, Ji C. ([id number]) have been using silver alginate under 3 layer compression 10/7; wound on the left anterior leg. Measuring smaller. Surface of this looks healthy. We have been using silver alginate under 3 layer compression 10/14; wound on the left anterior lower leg. Measuring smaller. Requires a light surface debridement be been using silver alginate under 3 layer compression Electronic Signature(s) Signed: 05/29/2019 5:10:21 PM By: Linton Ham MD Entered By: Linton Ham on 05/29/2019 14:00:53 Hackleman, Jontavia C. ([id number]) -------------------------------------------------------------------------------- Physical Exam Details Patient Name: Lumsden, Rickell C. Date of Service: 05/29/2019 1:30 PM Medical Record Number: [id number] Patient Account Number: [id number] Date of Birth/Sex: 20-Feb-1924 (85 y.o. F) Treating RN: Cornell Barman Primary Care Provider: Paulita Cradle Other Clinician: Referring Provider: Paulita Cradle Treating Provider/Extender: Ricard Dillon Weeks in Treatment: 6 Constitutional Sitting or standing Blood Pressure is within target range for patient.. Pulse regular and within target range for patient.Marland Kitchen Respirations regular, non-labored and within target range.. Temperature is normal and within the target range for the patient.Marland Kitchen appears in no distress. Cardiovascular Pedal pulses are palpable on the  left. Changes of severe venous hypertension. Integumentary (Hair, Skin) Hemosiderin deposition bilaterally. Notes Wound exam; left anterior lower tibia.  Dimensions have improved. There is debris on the surface which I have removed with a #5 curette Electronic Signature(s) Signed: 05/29/2019 5:10:21 PM By: Linton Ham MD Entered By: Linton Ham on 05/29/2019 14:01:25 Lamison, Ambyr C. ([id number]) -------------------------------------------------------------------------------- Physician Orders Details Patient Name: Clary, Syriah C. Date of Service: 05/29/2019 1:30 PM Medical Record Number: [id number] Patient Account Number: [id number] Date of Birth/Sex: 10-08-1923 (85 y.o. F) Treating RN: Cornell Barman Primary Care Provider: Paulita Cradle Other Clinician: Referring Provider: Paulita Cradle Treating Provider/Extender: Tito Dine in Treatment: 6 Verbal / Phone Orders: No Diagnosis Coding Wound Cleansing Wound #2 Left,Anterior Lower Leg o Dial antibacterial soap, wash wounds, rinse and pat dry prior to dressing wounds Anesthetic (add to Medication List) Wound #2 Left,Anterior Lower Leg o Topical Lidocaine 4% cream applied to wound bed prior to debridement (In Clinic Only). Primary Wound Dressing Wound #2 Left,Anterior Lower Leg o Silver Alginate Secondary Dressing Wound #2 Left,Anterior Lower Leg o ABD pad Dressing Change Frequency Wound #2 Left,Anterior Lower Leg o Change dressing every week Follow-up Appointments Wound #2 Left,Anterior Lower Leg o Return Appointment in 1 week. o Nurse Visit as needed Edema Control Wound #2 Left,Anterior Lower Leg o 3 Layer Compression System - Left Lower Extremity - unna to anchor Additional Orders / Instructions Wound #2 Left,Anterior Lower Leg o Activity as tolerated Electronic Signature(s) Signed: 05/29/2019 5:10:21 PM By: Linton Ham MD Signed: 05/29/2019 5:44:53 PM By: Gretta Cool, BSN,  RN, CWS, Kim RN, BSN Entered By: Gretta Cool, BSN, RN, CWS, Kim on 05/29/2019 13:56:22 Agrawal, Elodia C. ([id number]) -------------------------------------------------------------------------------- Problem List Details Patient Name: Renda, Katlyne C. Date of Service: 05/29/2019 1:30 PM Medical Record Number: [id number] Patient Account Number: [id number] Date of Birth/Sex: 10-Aug-1924 (85 y.o. F) Treating RN: Cornell Barman Primary Care Provider: Paulita Cradle Other Clinician: Referring Provider: Paulita Cradle Treating Provider/Extender: Tito Dine in Treatment: 6 Active Problems ICD-10 Evaluated Encounter Code Description Active Date Today Diagnosis S80.11XD Contusion of right lower leg, subsequent encounter 04/17/2019 No Yes I87.321 Chronic venous hypertension (idiopathic) with inflammation of 04/17/2019 No Yes right lower extremity Inactive Problems Resolved Problems Electronic Signature(s) Signed: 05/29/2019 5:10:21 PM By: Linton Ham MD Entered By: Linton Ham on 05/29/2019 13:58:05 Molitor, Analeah C. ([id number]) -------------------------------------------------------------------------------- Progress Note Details Patient Name: Savona, Tyniesha C. Date of Service: 05/29/2019 1:30 PM Medical Record Number: [id number] Patient Account Number: [id number] Date of Birth/Sex: 04-Aug-1924 (85 y.o. F) Treating RN: Cornell Barman Primary Care Provider: Paulita Cradle Other Clinician: Referring Provider: Paulita Cradle Treating Provider/Extender: Tito Dine in Treatment: 6 Subjective History of Present Illness (HPI) 08/06/18 on evaluation today patient actually presents for initial evaluation concerning a right anterior lower extremity ulcer which has been present for 4-5 months. She is a very poor historian really does not know if she tripped or how in fact she fell and injured this area. Upon further questioning it appears she actually has  dimension I will explain a scenario such as how to perform the dressing changes and then she will ask me questions such as how often it should be done when I very specifically pointed this out to her. I definitely think that there is a strong component of dementia here leading to her poor history. She does have a history of atrial fibrillation as will a stroke and is on blood thinners chronically for this. She also has stage IV chronic kidney disease. Patient's wound over the anterior lower extremity of the right leg actually show signs of being completely eschar covered  upon my initial evaluation today. No fevers, chills, nausea, or vomiting noted at this time. 08/20/18 on evaluation today patient's wound actually appears to be doing significantly better which is excellent news. She has been having her daughter change the dressing's which I think has worked out great for her. With that being said we had previously ordered home health although they never actually got started simply due to the fact that I was concerned about the patient's dimension of her being able to actually perform the dressing changes appropriately of her own accord. Nonetheless with her daughter taking care of this for her I see no issues and they seem to be doing excellent at this point. 09/03/18 on evaluation today patient's wound actually appears to be doing much better which is good news. With that being said she did initially have a lot of dry drainage on the surface of the wound. This is subsequently due to the fact that on Friday patient's daughter [name] she will move the dressing to clean the area and then forgot to reapply the dressing before she left her mother's home. She subsequently called her father to get him to apply it but when she picked her mother up today to bring her to the appointment this still did not have a dressing on it. Nonetheless things do not appear to be doing too poorly which is good news overall I  do feel like she's showing signs of improvement. 09/10/18 on evaluation today patient's wound actually appears to be completely healed which is excellent news. Overall she's been tolerating the dressing changes without complication. READMISSION 04/17/2019 This is a 85 year old woman who has a history of a wound on her right anterior lower extremity of uncertain etiology in late 2019 early 2020. She was seen in this clinic and healed out with standard care. She has chronic venous insufficiency. She does not wear stockings. She is not a diabetic. Noticed by her husband to have a fairly large hematoma on the left anterior lower leg. They are here for our review of this. She is on Plavix and aspirin presumably for prophylaxis and atrial fibrillation. They have not been doing anything to the wound. The patient has a history of atrial fibrillation on Plavix and aspirin, dementia for which she has not had an evaluation history of TIA/CVA, stage IV chronic renal failure, aortic stenosis. We did not do ABIs because of location of the area. 9/16; the patient came in with the hematoma still largely intact which was surprising however with gentle palpation it was also noted to be leaking inferiorly. Using pickups and scissors I remove this denuded skin on the top. Gauze and saline to remove the gelatinous hematoma and finally debrided the wound surface. We will use silver alginate 9/23; the hematoma was evacuated last week on the left anterior lower extremity. Wound was debrided. We use silver alginate under compression. Arrives today with a fair amount of bleeding extending into the secondary dressings nevertheless the wound looks quite good healthy tissue and measuring smaller 9/30; wound on the left anterior lower leg. Measures smaller. Surface of this is healthy. The patient has severe venous Porzio, Ifeoma C. ([id number]) hypertension but does not wear stockings. Stockings have been suggested in the past  she does not comply with them. We have been using silver alginate under 3 layer compression 10/7; wound on the left anterior leg. Measuring smaller. Surface of this looks healthy. We have been using silver alginate under 3 layer compression 10/14; wound on the  left anterior lower leg. Measuring smaller. Requires a light surface debridement be been using silver alginate under 3 layer compression Objective Constitutional Sitting or standing Blood Pressure is within target range for patient.. Pulse regular and within target range for patient.Marland Kitchen Respirations regular, non-labored and within target range.. Temperature is normal and within the target range for the patient.Marland Kitchen appears in no distress. Vitals Time Taken: 1:35 PM, Height: 65 in, Weight: 110 lbs, BMI: 18.3, Temperature: 98.3 F, Pulse: 66 bpm, Respiratory Rate: 18 breaths/min, Blood Pressure: 102/41 mmHg. Cardiovascular Pedal pulses are palpable on the left. Changes of severe venous hypertension. General Notes: Wound exam; left anterior lower tibia. Dimensions have improved. There is debris on the surface which I have removed with a #5 curette Integumentary (Hair, Skin) Hemosiderin deposition bilaterally. Wound #2 status is Open. Original cause of wound was Trauma. The wound is located on the Left,Anterior Lower Leg. The wound measures 2cm length x 1cm width x 0.1cm depth; 1.571cm^2 area and 0.157cm^3 volume. There is Fat Layer (Subcutaneous Tissue) Exposed exposed. There is no tunneling or undermining noted. There is a small amount of sanguinous drainage noted. The wound margin is indistinct and nonvisible. There is large (67-100%) granulation within the wound bed. There is a small (1-33%) amount of necrotic tissue within the wound bed including Adherent Slough. Assessment Active Problems ICD-10 Contusion of right lower leg, subsequent encounter Chronic venous hypertension (idiopathic) with inflammation of right lower  extremity Seals, Jurney C. ([id number]) Procedures Wound #2 Pre-procedure diagnosis of Wound #2 is a Trauma, Other located on the Left,Anterior Lower Leg . There was a Selective/Open Wound Non-Viable Tissue Debridement with a total area of 2 sq cm performed by Ricard Dillon, MD. With the following instrument(s): Curette to remove Non-Viable tissue/material. Material removed includes Eschar after achieving pain control using Lidocaine. A time out was conducted at 13:55, prior to the start of the procedure. A Minimum amount of bleeding was controlled with Pressure. The procedure was tolerated well. The patient had a pain level of 0 following the procedure. Post Debridement Measurements: 2cm length x 1cm width x 0.1cm depth; 0.157cm^3 volume. Character of Wound/Ulcer Post Debridement is stable. Post procedure Diagnosis Wound #2: Same as Pre-Procedure Pre-procedure diagnosis of Wound #2 is a Trauma, Other located on the Left,Anterior Lower Leg . There was a Three Layer Compression Therapy Procedure by Cornell Barman, RN. Post procedure Diagnosis Wound #2: Same as Pre-Procedure Plan Wound Cleansing: Wound #2 Left,Anterior Lower Leg: Dial antibacterial soap, wash wounds, rinse and pat dry prior to dressing wounds Anesthetic (add to Medication List): Wound #2 Left,Anterior Lower Leg: Topical Lidocaine 4% cream applied to wound bed prior to debridement (In Clinic Only). Primary Wound Dressing: Wound #2 Left,Anterior Lower Leg: Silver Alginate Secondary Dressing: Wound #2 Left,Anterior Lower Leg: ABD pad Dressing Change Frequency: Wound #2 Left,Anterior Lower Leg: Change dressing every week Follow-up Appointments: Wound #2 Left,Anterior Lower Leg: Return Appointment in 1 week. Nurse Visit as needed Edema Control: Wound #2 Left,Anterior Lower Leg: 3 Layer Compression System - Left Lower Extremity - unna to anchor Additional Orders / Instructions: Wound #2 Left,Anterior Lower  Leg: Activity as tolerated 1. We continue to make excellent progress. Silver alginate under 3 layer compression 2. Quite possibly closed by next week 3. I have previously had a discussion with the patient and her daughter about stockings. Apparently the patient is very resistant to this MARDELLA, RIEDER ([id number]) Electronic Signature(s) Signed: 05/29/2019 5:10:21 PM By: Linton Ham MD Entered By: Linton Ham  on 05/29/2019 14:20:24 Trumbo, Larah C. ([id number]) -------------------------------------------------------------------------------- SuperBill Details Patient Name: Wierzbicki, Tiyona C. Date of Service: 05/29/2019 Medical Record Number: [id number] Patient Account Number: [id number] Date of Birth/Sex: 04/04/1924 (85 y.o. F) Treating RN: Cornell Barman Primary Care Provider: Paulita Cradle Other Clinician: Referring Provider: Paulita Cradle Treating Provider/Extender: Tito Dine in Treatment: 6 Diagnosis Coding ICD-10 Codes Code Description S80.11XD Contusion of right lower leg, subsequent encounter I87.321 Chronic venous hypertension (idiopathic) with inflammation of right lower extremity Facility Procedures CPT4 Code Description: [id number] - DEBRIDE WOUND 1ST 20 SQ CM OR < ICD-10 Diagnosis Description S80.11XD Contusion of right lower leg, subsequent encounter I87.321 Chronic venous hypertension (idiopathic) with inflammation of r Modifier: ight lower ext Quantity: 1 remity Physician Procedures CPT4 Code Description: [id number] - WC PHYS DEBR WO ANESTH 20 SQ CM ICD-10 Diagnosis Description S80.11XD Contusion of right lower leg, subsequent encounter I87.321 Chronic venous hypertension (idiopathic) with inflammation of r Modifier: ight lower extr Quantity: 1 emity Electronic Signature(s) Signed: 05/29/2019 5:10:21 PM By: Linton Ham MD Entered By: Linton Ham on 05/29/2019 14:20:43

## 2019-05-30 NOTE — Progress Notes (Signed)
Votta, Jazzmen C. ([id number]) Visit Report for 05/29/2019 Arrival Information Details Patient Name: Cristina [name], [name] Date of Service: 05/29/2019 1:30 PM Medical Record Number: [id number] Patient Account Number: [id number] Date of Birth/Sex: [birth year] (85 y.o. F) Treating RN: Harold Barban Primary Care Icelyn Navarrete: Paulita Cradle Other Clinician: Referring Jaydyn Menon: Paulita Cradle Treating Beverley Sherrard/Extender: Tito Dine in Treatment: 6 Visit Information History Since Last Visit Added or deleted any medications: No Patient Arrived: Walker Any new allergies or adverse reactions: No Arrival Time: 13:32 Had a fall or experienced change in No Accompanied By: daughter activities of daily living that may affect Transfer Assistance: None risk of falls: Patient Identification Verified: Yes Signs or symptoms of abuse/neglect since last visito No Secondary Verification Process Yes Hospitalized since last visit: No Completed: Has Dressing in Place as Prescribed: Yes Patient Has Alerts: Yes Has Compression in Place as Prescribed: Yes Patient Alerts: Patient on Blood Pain Present Now: No Thinner Pt taking plavix Electronic Signature(s) Signed: 05/29/2019 4:37:37 PM By: Harold Barban Entered By: Harold Barban on 05/29/2019 13:34:20 Napolitano, Mimi C. ([id number]) -------------------------------------------------------------------------------- Compression Therapy Details Patient Name: Stampley, Jolynne C. Date of Service: 05/29/2019 1:30 PM Medical Record Number: [id number] Patient Account Number: [id number] Date of Birth/Sex: 01/03/1924 (85 y.o. F) Treating RN: Cornell Barman Primary Care Siniya Lichty: Paulita Cradle Other Clinician: Referring Kesi Perrow: Paulita Cradle Treating Arbor Cohen/Extender: Tito Dine in Treatment: 6 Compression Therapy Performed for Wound Assessment: Wound #2 Left,Anterior Lower Leg Performed By: Clinician Cornell Barman,  RN Compression Type: Three Layer Post Procedure Diagnosis Same as Pre-procedure Electronic Signature(s) Signed: 05/29/2019 5:44:53 PM By: Gretta Cool, BSN, RN, CWS, Kim RN, BSN Entered By: Gretta Cool, BSN, RN, CWS, Kim on 05/29/2019 13:55:56 Mulvihill, Rigoberto Noel ([id number]) -------------------------------------------------------------------------------- Encounter Discharge Information Details Patient Name: Weinfeld, Grissel C. Date of Service: 05/29/2019 1:30 PM Medical Record Number: [id number] Patient Account Number: [id number] Date of Birth/Sex: Dec 28, 1923 (85 y.o. F) Treating RN: Cornell Barman Primary Care Kush Farabee: Paulita Cradle Other Clinician: Referring Shann Merrick: Paulita Cradle Treating Milledge Gerding/Extender: Tito Dine in Treatment: 6 Encounter Discharge Information Items Post Procedure Vitals Discharge Condition: Stable Temperature (F): 98.3 Ambulatory Status: Ambulatory Pulse (bpm): 66 Discharge Destination: Home Respiratory Rate (breaths/min): 16 Transportation: Private Auto Blood Pressure (mmHg): 102/41 Accompanied By: daughter Schedule Follow-up Appointment: Yes Clinical Summary of Care: Electronic Signature(s) Signed: 05/29/2019 5:44:53 PM By: Gretta Cool, BSN, RN, CWS, Kim RN, BSN Entered By: Gretta Cool, BSN, RN, CWS, Kim on 05/29/2019 13:59:29 Maish, Eufelia C. ([id number]) -------------------------------------------------------------------------------- Lower Extremity Assessment Details Patient Name: Lachapelle, Nathali C. Date of Service: 05/29/2019 1:30 PM Medical Record Number: [id number] Patient Account Number: [id number] Date of Birth/Sex: 1924-05-10 (85 y.o. F) Treating RN: Harold Barban Primary Care Dez Stauffer: Paulita Cradle Other Clinician: Referring Wynee Matarazzo: Paulita Cradle Treating Jamonta Goerner/Extender: Tito Dine in Treatment: 6 Edema Assessment Assessed: [Left: No] [Right: No] [Left: Edema] [Right: :] Calf Left: Right: Point of  Measurement: 32 cm From Medial Instep 31 cm cm Ankle Left: Right: Point of Measurement: 10 cm From Medial Instep 18.5 cm cm Vascular Assessment Pulses: Dorsalis Pedis Palpable: [Left:Yes] Posterior Tibial Palpable: [Left:Yes] Electronic Signature(s) Signed: 05/29/2019 4:37:37 PM By: Harold Barban Entered By: Harold Barban on 05/29/2019 13:46:39 Cody, Trinidy C. ([id number]) -------------------------------------------------------------------------------- Multi Wound Chart Details Patient Name: Roell, Clarine C. Date of Service: 05/29/2019 1:30 PM Medical Record Number: [id number] Patient Account Number: [id number] Date of Birth/Sex: [birth year] (85 y.o. F) Treating RN: Cornell Barman Primary Care Myracle Febres: Paulita Cradle Other Clinician: Referring Kemia Wendel: Paulita Cradle Treating Yoshiye Kraft/Extender: Tito Dine in  Treatment: 6 Vital Signs Height(in): 65 Pulse(bpm): 66 Weight(lbs): 110 Blood Pressure(mmHg): 102/41 Body Mass Index(BMI): 18 Temperature(F): 98.3 Respiratory Rate 18 (breaths/min): Photos: [N/A:N/A] Wound Location: Left Lower Leg - Anterior N/A N/A Wounding Event: Trauma N/A N/A Primary Etiology: Trauma, Other N/A N/A Comorbid History: Cataracts, Arrhythmia, N/A N/A Congestive Heart Failure, End Stage Renal Disease Date Acquired: 04/14/2019 N/A N/A Weeks of Treatment: 6 N/A N/A Wound Status: Open N/A N/A Measurements L x W x D 2x1x0.1 N/A N/A (cm) Area (cm) : 1.571 N/A N/A Volume (cm) : 0.157 N/A N/A % Reduction in Area: 88.60% N/A N/A % Reduction in Volume: 88.60% N/A N/A Classification: Full Thickness Without N/A N/A Exposed Support Structures Exudate Amount: Small N/A N/A Exudate Type: Sanguinous N/A N/A Exudate Color: red N/A N/A Wound Margin: Indistinct, nonvisible N/A N/A Granulation Amount: Large (67-100%) N/A N/A Necrotic Amount: Small (1-33%) N/A N/A Exposed Structures: Fat Layer (Subcutaneous N/A N/A Tissue)  Exposed: Yes Fascia: No Tendon: No Muscle: No [name], [name] ([id number]) Joint: No Bone: No Epithelialization: Small (1-33%) N/A N/A Debridement: Debridement - Selective/Open N/A N/A Wound Pre-procedure 13:55 N/A N/A Verification/Time Out Taken: Pain Control: Lidocaine N/A N/A Tissue Debrided: Necrotic/Eschar N/A N/A Level: Non-Viable Tissue N/A N/A Debridement Area (sq cm): 2 N/A N/A Instrument: Curette N/A N/A Bleeding: Minimum N/A N/A Hemostasis Achieved: Pressure N/A N/A Post Procedural Pain: 0 N/A N/A Debridement Treatment Procedure was tolerated well N/A N/A Response: Post Debridement 2x1x0.1 N/A N/A Measurements L x W x D (cm) Post Debridement Volume: 0.157 N/A N/A (cm) Procedures Performed: Compression Therapy N/A N/A Debridement Treatment Notes Electronic Signature(s) Signed: 05/29/2019 5:10:21 PM By: Linton Ham MD Entered By: Linton Ham on 05/29/2019 13:58:11 Klump, Lafern C. ([id number]) -------------------------------------------------------------------------------- [place] Details Patient Name: Neuenfeldt, Jaskiran C. Date of Service: 05/29/2019 1:30 PM Medical Record Number: [id number] Patient Account Number: [id number] Date of Birth/Sex: [birth year] (85 y.o. F) Treating RN: Cornell Barman Primary Care Jassica Zazueta: Paulita Cradle Other Clinician: Referring Gissella Niblack: Paulita Cradle Treating Deisi Salonga/Extender: Tito Dine in Treatment: 6 Active Inactive Abuse / Safety / Falls / Self Care Management Nursing Diagnoses: Potential for falls Goals: Patient will remain injury free related to falls Date Initiated: 04/17/2019 Target Resolution Date: 05/24/2019 Goal Status: Active Interventions: Assess fall risk on admission and as needed Notes: Orientation to the Wound Care Program Nursing Diagnoses: Knowledge deficit related to the wound healing center program Goals: Patient/caregiver will verbalize  understanding of the [facility] Date Initiated: 04/17/2019 Target Resolution Date: 05/24/2019 Goal Status: Active Interventions: Provide education on orientation to the wound center Notes: Wound/Skin Impairment Nursing Diagnoses: Impaired tissue integrity Goals: Ulcer/skin breakdown will have a volume reduction of 30% by week 4 Date Initiated: 04/17/2019 Target Resolution Date: 05/17/2019 Goal Status: Active Interventions: Assess ulceration(s) every visit Sortor, Undine C. ([id number]) Treatment Activities: Skin care regimen initiated : 04/17/2019 Notes: Electronic Signature(s) Signed: 05/29/2019 5:44:53 PM By: Gretta Cool, BSN, RN, CWS, Kim RN, BSN Entered By: Gretta Cool, BSN, RN, CWS, Kim on 05/29/2019 13:53:49 Moga, Phoua C. ([id number]) -------------------------------------------------------------------------------- Pain Assessment Details Patient Name: Paccione, Berkleigh C. Date of Service: 05/29/2019 1:30 PM Medical Record Number: [id number] Patient Account Number: [id number] Date of Birth/Sex: 09/20/1923 (85 y.o. F) Treating RN: Harold Barban Primary Care Kaylean Tupou: Paulita Cradle Other Clinician: Referring Alvira Hecht: Paulita Cradle Treating Selso Mannor/Extender: Tito Dine in Treatment: 6 Active Problems Location of Pain Severity and Description of Pain Patient Has Paino No Site Locations Pain Management and Medication Current Pain Management: Electronic Signature(s) Signed:  05/29/2019 4:37:37 PM By: Harold Barban Entered By: Harold Barban on 05/29/2019 13:35:16 Netzel, Korri CMarland Kitchen ([id number]) -------------------------------------------------------------------------------- Patient/Caregiver Education Details Patient Name: Edling, Shannette C. Date of Service: 05/29/2019 1:30 PM Medical Record Number: [id number] Patient Account Number: [id number] Date of Birth/Gender: [birth year] (85 y.o. F) Treating RN: Cornell Barman Primary Care Physician:  Paulita Cradle Other Clinician: Referring Physician: Paulita Cradle Treating Physician/Extender: Tito Dine in Treatment: 6 Education Assessment Education Provided To: Patient and Caregiver Education Topics Provided Venous: Handouts: Controlling Swelling with Multilayered Compression Wraps Methods: Demonstration, Explain/Verbal Responses: Refused information Wound Debridement: Handouts: Wound Debridement Methods: Demonstration, Explain/Verbal Responses: State content correctly Electronic Signature(s) Signed: 05/29/2019 5:44:53 PM By: Gretta Cool, BSN, RN, CWS, Kim RN, BSN Entered By: Gretta Cool, BSN, RN, CWS, Kim on 05/29/2019 13:58:32 Rutan, Tristen C. ([id number]) -------------------------------------------------------------------------------- Wound Assessment Details Patient Name: Heidemann, Suriyah C. Date of Service: 05/29/2019 1:30 PM Medical Record Number: [id number] Patient Account Number: [id number] Date of Birth/Sex: [birth year] (85 y.o. F) Treating RN: Harold Barban Primary Care Roylee Chaffin: Paulita Cradle Other Clinician: Referring Mariateresa Batra: Paulita Cradle Treating Kaneesha Constantino/Extender: Tito Dine in Treatment: 6 Wound Status Wound Number: 2 Primary Trauma, Other Etiology: Wound Location: Left Lower Leg - Anterior Wound Open Wounding Event: Trauma Status: Date Acquired: 04/14/2019 Comorbid Cataracts, Arrhythmia, Congestive Heart Weeks Of Treatment: 6 History: Failure, End Stage Renal Disease Clustered Wound: No Photos Wound Measurements Length: (cm) 2 Width: (cm) 1 Depth: (cm) 0.1 Area: (cm) 1.571 Volume: (cm) 0.157 % Reduction in Area: 88.6% % Reduction in Volume: 88.6% Epithelialization: Small (1-33%) Tunneling: No Undermining: No Wound Description Full Thickness Without Exposed Support Foul Od Classification: Structures Slough/ Wound Margin: Indistinct, nonvisible Exudate Small Amount: Exudate Type:  Sanguinous Exudate Color: red or After Cleansing: No Fibrino No Wound Bed Granulation Amount: Large (67-100%) Exposed Structure Necrotic Amount: Small (1-33%) Fascia Exposed: No Necrotic Quality: Adherent Slough Fat Layer (Subcutaneous Tissue) Exposed: Yes Tendon Exposed: No Muscle Exposed: No Joint Exposed: No Bone Exposed: No Rhinehart, Pepper C. ([id number]) Treatment Notes Wound #2 (Left, Anterior Lower Leg) Notes Silver cell, ABD, 3-Layer Left, unna to anchor Electronic Signature(s) Signed: 05/29/2019 4:37:37 PM By: Harold Barban Entered By: Harold Barban on 05/29/2019 13:47:06 Carino, Kieryn C. ([id number]) -------------------------------------------------------------------------------- Vitals Details Patient Name: Fomby, Jara C. Date of Service: 05/29/2019 1:30 PM Medical Record Number: [id number] Patient Account Number: [id number] Date of Birth/Sex: 03-18-1924 (85 y.o. F) Treating RN: Harold Barban Primary Care Nickolus Wadding: Paulita Cradle Other Clinician: Referring Taylie Helder: Paulita Cradle Treating Jalayna Josten/Extender: Tito Dine in Treatment: 6 Vital Signs Time Taken: 13:35 Temperature (F): 98.3 Height (in): 65 Pulse (bpm): 66 Weight (lbs): 110 Respiratory Rate (breaths/min): 18 Body Mass Index (BMI): 18.3 Blood Pressure (mmHg): 102/41 Reference Range: 80 - 120 mg / dl Electronic Signature(s) Signed: 05/29/2019 4:37:37 PM By: Harold Barban Entered By: Harold Barban on 05/29/2019 13:35:56

## 2019-06-05 ENCOUNTER — Encounter: Payer: Medicare Other | Admitting: Internal Medicine

## 2019-06-05 ENCOUNTER — Other Ambulatory Visit: Payer: Self-pay

## 2019-06-05 DIAGNOSIS — S8011XD Contusion of right lower leg, subsequent encounter: Secondary | ICD-10-CM | POA: Diagnosis not present

## 2019-06-05 NOTE — Progress Notes (Signed)
[name], [name] ([id number]) Visit Report for 06/05/2019 HPI Details Patient Name: [name], [name] Date of Service: 06/05/2019 1:00 PM Medical Record Number: [id number] Patient Account Number: [id number] Date of Birth/Sex: May 07, 1924 (85 y.o. F) Treating RN: Cornell Barman Primary Care Provider: Paulita Cradle Other Clinician: Referring Provider: Paulita Cradle Treating Provider/Extender: Tito Dine in Treatment: 7 History of Present Illness HPI Description: 08/06/18 on evaluation today patient actually presents for initial evaluation concerning a right anterior lower extremity ulcer which has been present for 4-5 months. She is a very poor historian really does not know if she tripped or how in fact she fell and injured this area. Upon further questioning it appears she actually has dimension I will explain a scenario such as how to perform the dressing changes and then she will ask me questions such as how often it should be done when I very specifically pointed this out to her. I definitely think that there is a strong component of dementia here leading to her poor history. She does have a history of atrial fibrillation as will a stroke and is on blood thinners chronically for this. She also has stage IV chronic kidney disease. Patient's wound over the anterior lower extremity of the right leg actually show signs of being completely eschar covered upon my initial evaluation today. No fevers, chills, nausea, or vomiting noted at this time. 08/20/18 on evaluation today patient's wound actually appears to be doing significantly better which is excellent news. She has been having her daughter change the dressing's which I think has worked out great for her. With that being said we had previously ordered home health although they never actually got started simply due to the fact that I was concerned about the patient's dimension of her being able to actually perform the  dressing changes appropriately of her own accord. Nonetheless with her daughter taking care of this for her I see no issues and they seem to be doing excellent at this point. 09/03/18 on evaluation today patient's wound actually appears to be doing much better which is good news. With that being said she did initially have a lot of dry drainage on the surface of the wound. This is subsequently due to the fact that on Friday patient's daughter [name] she will move the dressing to clean the area and then forgot to reapply the dressing before she left her mother's home. She subsequently called her father to get him to apply it but when she picked her mother up today to bring her to the appointment this still did not have a dressing on it. Nonetheless things do not appear to be doing too poorly which is good news overall I do feel like she's showing signs of improvement. 09/10/18 on evaluation today patient's wound actually appears to be completely healed which is excellent news. Overall she's been tolerating the dressing changes without complication. READMISSION 04/17/2019 This is a 85 year old woman who has a history of a wound on her right anterior lower extremity of uncertain etiology in late 2019 early 2020. She was seen in this clinic and healed out with standard care. She has chronic venous insufficiency. She does not wear stockings. She is not a diabetic. Noticed by her husband to have a fairly large hematoma on the left anterior lower leg. They are here for our review of this. She is on Plavix and aspirin presumably for prophylaxis and atrial fibrillation. They have not been doing anything to the wound. The patient has a  history of atrial fibrillation on Plavix and aspirin, dementia for which she has not had an evaluation history of TIA/CVA, stage IV chronic renal failure, aortic stenosis. We did not do ABIs because of location of the area. 9/16; the patient came in with the hematoma still  largely intact which was surprising however with gentle palpation it was also noted to be leaking inferiorly. Using pickups and scissors I remove this denuded skin on the top. Gauze and saline to remove the gelatinous hematoma and finally debrided the wound surface. We will use silver alginate 9/23; the hematoma was evacuated last week on the left anterior lower extremity. Wound was debrided. We use silver Brummell, [name] ([id number]) alginate under compression. Arrives today with a fair amount of bleeding extending into the secondary dressings nevertheless the wound looks quite good healthy tissue and measuring smaller 9/30; wound on the left anterior lower leg. Measures smaller. Surface of this is healthy. The patient has severe venous hypertension but does not wear stockings. Stockings have been suggested in the past she does not comply with them. We have been using silver alginate under 3 layer compression 10/7; wound on the left anterior leg. Measuring smaller. Surface of this looks healthy. We have been using silver alginate under 3 layer compression 10/14; wound on the left anterior lower leg. Measuring smaller. Requires a light surface debridement be been using silver alginate under 3 layer compression 10/21 wound on the left anterior lower leg which was initially a traumatic hematoma. This is completely healed today. Electronic Signature(s) Signed: 06/05/2019 5:06:38 PM By: Linton Ham MD Entered By: Linton Ham on 06/05/2019 13:28:54 [name], [name] ([id number]) -------------------------------------------------------------------------------- Physical Exam Details Patient Name: [name], [name] Date of Service: 06/05/2019 1:00 PM Medical Record Number: [id number] Patient Account Number: [id number] Date of Birth/Sex: 1924/05/12 (85 y.o. F) Treating RN: Cornell Barman Primary Care Provider: Paulita Cradle Other Clinician: Referring Provider: Paulita Cradle Treating Provider/Extender: Tito Dine in Treatment: 7 Constitutional Patient is hypotensive.. Pulse regular and within target range for patient.Marland Kitchen Respirations regular, non-labored and within target range.. Temperature is normal and within the target range for the patient.Marland Kitchen appears in no distress. Eyes . Notes Wound exam; patient has severe bilateral venous hypertension changes with hemosiderin deposition, chronically damaged skin in the distal lower extremities. She does not have a lot in the way of edema. The hematoma induced wound on the left anterior tibia is completely epithelialized. Electronic Signature(s) Signed: 06/05/2019 5:06:38 PM By: Linton Ham MD Entered By: Linton Ham on 06/05/2019 13:30:11 [name], [name] ([id number]) -------------------------------------------------------------------------------- Physician Orders Details Patient Name: [name], [name] Date of Service: 06/05/2019 1:00 PM Medical Record Number: [id number] Patient Account Number: [id number] Date of Birth/Sex: 1924/06/17 (85 y.o. F) Treating RN: Cornell Barman Primary Care Provider: Paulita Cradle Other Clinician: Referring Provider: Paulita Cradle Treating Provider/Extender: Tito Dine in Treatment: 7 Verbal / Phone Orders: No Diagnosis Coding Edema Control o Patient to wear own compression stockings Discharge From [place] o Discharge from [place] complete Electronic Signature(s) Signed: 06/05/2019 4:31:46 PM By: Gretta Cool, BSN, RN, CWS, Kim RN, BSN Signed: 06/05/2019 5:06:38 PM By: Linton Ham MD Entered By: Gretta Cool, BSN, RN, CWS, Kim on 06/05/2019 13:26:01 [name], [name] ([id number]) -------------------------------------------------------------------------------- Problem List Details Patient Name: [name], [name] Date of Service: 06/05/2019 1:00 PM Medical Record Number: [id number] Patient Account Number:  [id number] Date of Birth/Sex: 12-25-1923 (85 y.o. F) Treating RN: Cornell Barman Primary Care Provider: Paulita Cradle Other Clinician:  Referring Provider: Carrie Mew, MIRIAM Treating Provider/Extender: Tito Dine in Treatment: 7 Active Problems ICD-10 Evaluated Encounter Code Description Active Date Today Diagnosis S80.11XD Contusion of right lower leg, subsequent encounter 04/17/2019 No Yes I87.321 Chronic venous hypertension (idiopathic) with inflammation of 04/17/2019 No Yes right lower extremity Inactive Problems Resolved Problems Electronic Signature(s) Signed: 06/05/2019 5:06:38 PM By: Linton Ham MD Entered By: Linton Ham on 06/05/2019 13:28:01 [name], [name] ([id number]) -------------------------------------------------------------------------------- Progress Note Details Patient Name: [name], [name] Date of Service: 06/05/2019 1:00 PM Medical Record Number: [id number] Patient Account Number: [id number] Date of Birth/Sex: 11-09-1923 (85 y.o. F) Treating RN: Cornell Barman Primary Care Provider: Paulita Cradle Other Clinician: Referring Provider: Paulita Cradle Treating Provider/Extender: Tito Dine in Treatment: 7 Subjective History of Present Illness (HPI) 08/06/18 on evaluation today patient actually presents for initial evaluation concerning a right anterior lower extremity ulcer which has been present for 4-5 months. She is a very poor historian really does not know if she tripped or how in fact she fell and injured this area. Upon further questioning it appears she actually has dimension I will explain a scenario such as how to perform the dressing changes and then she will ask me questions such as how often it should be done when I very specifically pointed this out to her. I definitely think that there is a strong component of dementia here leading to her poor history. She does have a history of atrial fibrillation as will  a stroke and is on blood thinners chronically for this. She also has stage IV chronic kidney disease. Patient's wound over the anterior lower extremity of the right leg actually show signs of being completely eschar covered upon my initial evaluation today. No fevers, chills, nausea, or vomiting noted at this time. 08/20/18 on evaluation today patient's wound actually appears to be doing significantly better which is excellent news. She has been having her daughter change the dressing's which I think has worked out great for her. With that being said we had previously ordered home health although they never actually got started simply due to the fact that I was concerned about the patient's dimension of her being able to actually perform the dressing changes appropriately of her own accord. Nonetheless with her daughter taking care of this for her I see no issues and they seem to be doing excellent at this point. 09/03/18 on evaluation today patient's wound actually appears to be doing much better which is good news. With that being said she did initially have a lot of dry drainage on the surface of the wound. This is subsequently due to the fact that on Friday patient's daughter [name] she will move the dressing to clean the area and then forgot to reapply the dressing before she left her mother's home. She subsequently called her father to get him to apply it but when she picked her mother up today to bring her to the appointment this still did not have a dressing on it. Nonetheless things do not appear to be doing too poorly which is good news overall I do feel like she's showing signs of improvement. 09/10/18 on evaluation today patient's wound actually appears to be completely healed which is excellent news. Overall she's been tolerating the dressing changes without complication. READMISSION 04/17/2019 This is a 85 year old woman who has a history of a wound on her right anterior lower extremity of  uncertain etiology in late 2019 early 2020. She was seen in this clinic and  healed out with standard care. She has chronic venous insufficiency. She does not wear stockings. She is not a diabetic. Noticed by her husband to have a fairly large hematoma on the left anterior lower leg. They are here for our review of this. She is on Plavix and aspirin presumably for prophylaxis and atrial fibrillation. They have not been doing anything to the wound. The patient has a history of atrial fibrillation on Plavix and aspirin, dementia for which she has not had an evaluation history of TIA/CVA, stage IV chronic renal failure, aortic stenosis. We did not do ABIs because of location of the area. 9/16; the patient came in with the hematoma still largely intact which was surprising however with gentle palpation it was also noted to be leaking inferiorly. Using pickups and scissors I remove this denuded skin on the top. Gauze and saline to remove the gelatinous hematoma and finally debrided the wound surface. We will use silver alginate 9/23; the hematoma was evacuated last week on the left anterior lower extremity. Wound was debrided. We use silver alginate under compression. Arrives today with a fair amount of bleeding extending into the secondary dressings nevertheless the wound looks quite good healthy tissue and measuring smaller 9/30; wound on the left anterior lower leg. Measures smaller. Surface of this is healthy. The patient has severe venous [name], [name] ([id number]) hypertension but does not wear stockings. Stockings have been suggested in the past she does not comply with them. We have been using silver alginate under 3 layer compression 10/7; wound on the left anterior leg. Measuring smaller. Surface of this looks healthy. We have been using silver alginate under 3 layer compression 10/14; wound on the left anterior lower leg. Measuring smaller. Requires a light surface debridement be been  using silver alginate under 3 layer compression 10/21 wound on the left anterior lower leg which was initially a traumatic hematoma. This is completely healed today. Objective Constitutional Patient is hypotensive.. Pulse regular and within target range for patient.Marland Kitchen Respirations regular, non-labored and within target range.. Temperature is normal and within the target range for the patient.Marland Kitchen appears in no distress. Vitals Time Taken: 1:05 PM, Height: 65 in, Weight: 110 lbs, BMI: 18.3, Temperature: 98.4 F, Pulse: 51 bpm, Respiratory Rate: 16 breaths/min, Blood Pressure: 97/46 mmHg. General Notes: Wound exam; patient has severe bilateral venous hypertension changes with hemosiderin deposition, chronically damaged skin in the distal lower extremities. She does not have a lot in the way of edema. The hematoma induced wound on the left anterior tibia is completely epithelialized. Integumentary (Hair, Skin) Wound #2 status is Healed - Epithelialized. Original cause of wound was Trauma. The wound is located on the Left,Anterior Lower Leg. The wound measures 0cm length x 0cm width x 0cm depth; 0cm^2 area and 0cm^3 volume. There is no tunneling or undermining noted. There is a none present amount of drainage noted. The wound margin is indistinct and nonvisible. There is large (67-100%) granulation within the wound bed. There is no necrotic tissue within the wound bed. Assessment Active Problems ICD-10 Contusion of right lower leg, subsequent encounter Chronic venous hypertension (idiopathic) with inflammation of right lower extremity Plan Edema Control: Patient to wear own compression stockings [name], [name] ([id number]) Discharge From [facility] Services: Discharge from [place] complete 1. The patient to be discharged from the wound care center 2. She is encouraged to wear stockings even support hose although her daughter as previously stated it is unlikely she  will comply  with these Electronic Signature(s) Signed: 06/05/2019 5:06:38 PM By: Linton Ham MD Entered By: Linton Ham on 06/05/2019 13:30:46 Orgeron, Carmina C. ([id number]) -------------------------------------------------------------------------------- SuperBill Details Patient Name: Hochman, Zhanna C. Date of Service: 06/05/2019 Medical Record Number: [id number] Patient Account Number: [id number] Date of Birth/Sex: 26-Dec-1923 (85 y.o. F) Treating RN: Cornell Barman Primary Care Provider: Paulita Cradle Other Clinician: Referring Provider: Paulita Cradle Treating Provider/Extender: Tito Dine in Treatment: 7 Diagnosis Coding ICD-10 Codes Code Description S80.11XD Contusion of right lower leg, subsequent encounter I87.321 Chronic venous hypertension (idiopathic) with inflammation of right lower extremity Facility Procedures CPT4 Code: [id number] Description: [phone] - WOUND CARE VISIT-LEV 2 EST PT Modifier: Quantity: 1 Physician Procedures CPT4 Code Description: [id number] - WC PHYS LEVEL 2 - EST PT ICD-10 Diagnosis Description S80.11XD Contusion of right lower leg, subsequent encounter I87.321 Chronic venous hypertension (idiopathic) with inflammation of Modifier: right lower extr Quantity: 1 emity Electronic Signature(s) Signed: 06/05/2019 5:06:38 PM By: Linton Ham MD Entered By: Linton Ham on 06/05/2019 13:31:03

## 2019-06-05 NOTE — Progress Notes (Signed)
Tonelli, Brilee C. ([id number]) Visit Report for 06/05/2019 Arrival Information Details Patient Name: Pollio, [name] Date of Service: 06/05/2019 1:00 PM Medical Record Number: [id number] Patient Account Number: [id number] Date of Birth/Sex: [birth year] (85 y.o. F) Treating RN: Harold Barban Primary Care Jaima Janney: Paulita Cradle Other Clinician: Referring Genese Quebedeaux: Paulita Cradle Treating Isley Weisheit/Extender: Tito Dine in Treatment: 7 Visit Information History Since Last Visit Added or deleted any medications: No Patient Arrived: Walker Any new allergies or adverse reactions: No Arrival Time: 13:05 Had a fall or experienced change in No Accompanied By: daughter activities of daily living that may affect Transfer Assistance: None risk of falls: Patient Identification Verified: Yes Signs or symptoms of abuse/neglect since last visito No Secondary Verification Process Yes Hospitalized since last visit: No Completed: Has Dressing in Place as Prescribed: Yes Patient Has Alerts: Yes Has Compression in Place as Prescribed: Yes Patient Alerts: Patient on Blood Pain Present Now: No Thinner Pt taking plavix Electronic Signature(s) Signed: 06/05/2019 4:19:58 PM By: Harold Barban Entered By: Harold Barban on 06/05/2019 13:06:00 Biedermann, Uldine C. ([id number]) -------------------------------------------------------------------------------- Clinic Level of Care Assessment Details Patient Name: Dudding, [name] Date of Service: 06/05/2019 1:00 PM Medical Record Number: [id number] Patient Account Number: [id number] Date of Birth/Sex: Jun 27, 1924 (85 y.o. F) Treating RN: Cornell Barman Primary Care Ivis Nicolson: Paulita Cradle Other Clinician: Referring Mariyana Fulop: Paulita Cradle Treating Tahjanae Blankenburg/Extender: Tito Dine in Treatment: 7 Clinic Level of Care Assessment Items TOOL 4 Quantity Score []  - Use when only an EandM is performed on FOLLOW-UP  visit 0 ASSESSMENTS - Nursing Assessment / Reassessment []  - Reassessment of Co-morbidities (includes updates in patient status) 0 X- 1 5 Reassessment of Adherence to Treatment Plan ASSESSMENTS - Wound and Skin Assessment / Reassessment X - Simple Wound Assessment / Reassessment - one wound 1 5 []  - 0 Complex Wound Assessment / Reassessment - multiple wounds []  - 0 Dermatologic / Skin Assessment (not related to wound area) ASSESSMENTS - Focused Assessment []  - Circumferential Edema Measurements - multi extremities 0 []  - 0 Nutritional Assessment / Counseling / Intervention []  - 0 Lower Extremity Assessment (monofilament, tuning fork, pulses) []  - 0 Peripheral Arterial Disease Assessment (using hand held doppler) ASSESSMENTS - Ostomy and/or Continence Assessment and Care []  - Incontinence Assessment and Management 0 []  - 0 Ostomy Care Assessment and Management (repouching, etc.) PROCESS - Coordination of Care X - Simple Patient / Family Education for ongoing care 1 15 []  - 0 Complex (extensive) Patient / Family Education for ongoing care []  - 0 Staff obtains Programmer, systems, Records, Test Results / Process Orders []  - 0 Staff telephones HHA, Nursing Homes / Clarify orders / etc []  - 0 Routine Transfer to another Facility (non-emergent condition) []  - 0 Routine Hospital Admission (non-emergent condition) []  - 0 New Admissions / Biomedical engineer / Ordering NPWT, Apligraf, etc. []  - 0 Emergency Hospital Admission (emergent condition) X- 1 10 Simple Discharge Coordination Soja, Shealyn C. ([id number]) []  - 0 Complex (extensive) Discharge Coordination PROCESS - Special Needs []  - Pediatric / Minor Patient Management 0 []  - 0 Isolation Patient Management []  - 0 Hearing / Language / Visual special needs []  - 0 Assessment of Community assistance (transportation, D/C planning, etc.) []  - 0 Additional assistance / Altered mentation []  - 0 Support Surface(s) Assessment  (bed, cushion, seat, etc.) INTERVENTIONS - Wound Cleansing / Measurement []  - Simple Wound Cleansing - one wound 0 []  - 0 Complex Wound Cleansing - multiple wounds X- 1 5 Wound Imaging (photographs -  any number of wounds) []  - 0 Wound Tracing (instead of photographs) []  - 0 Simple Wound Measurement - one wound []  - 0 Complex Wound Measurement - multiple wounds INTERVENTIONS - Wound Dressings []  - Small Wound Dressing one or multiple wounds 0 []  - 0 Medium Wound Dressing one or multiple wounds []  - 0 Large Wound Dressing one or multiple wounds []  - 0 Application of Medications - topical []  - 0 Application of Medications - injection INTERVENTIONS - Miscellaneous []  - External ear exam 0 []  - 0 Specimen Collection (cultures, biopsies, blood, body fluids, etc.) []  - 0 Specimen(s) / Culture(s) sent or taken to Lab for analysis []  - 0 Patient Transfer (multiple staff / Civil Service fast streamer / Similar devices) []  - 0 Simple Staple / Suture removal (25 or less) []  - 0 Complex Staple / Suture removal (26 or more) []  - 0 Hypo / Hyperglycemic Management (close monitor of Blood Glucose) []  - 0 Ankle / Brachial Index (ABI) - do not check if billed separately X- 1 5 Vital Signs Mudry, Harper C. ([id number]) Has the patient been seen at the hospital within the last three years: Yes Total Score: 45 Level Of Care: New/Established - Level 2 Electronic Signature(s) Signed: 06/05/2019 4:31:46 PM By: Gretta Cool, BSN, RN, CWS, Kim RN, BSN Entered By: Gretta Cool, BSN, RN, CWS, Kim on 06/05/2019 13:26:19 Nin, Felesha C. ([id number]) -------------------------------------------------------------------------------- Encounter Discharge Information Details Patient Name: Bohle, Azyriah C. Date of Service: 06/05/2019 1:00 PM Medical Record Number: [id number] Patient Account Number: [id number] Date of Birth/Sex: Apr 07, 1924 (85 y.o. F) Treating RN: Cornell Barman Primary Care Colsen Modi: Paulita Cradle Other  Clinician: Referring Tamryn Popko: Paulita Cradle Treating Makaela Cando/Extender: Tito Dine in Treatment: 7 Encounter Discharge Information Items Discharge Condition: Stable Ambulatory Status: Walker Discharge Destination: Home Transportation: Private Auto Accompanied By: daughter Schedule Follow-up Appointment: No Clinical Summary of Care: Electronic Signature(s) Signed: 06/05/2019 4:31:46 PM By: Gretta Cool, BSN, RN, CWS, Kim RN, BSN Entered By: Gretta Cool, BSN, RN, CWS, Kim on 06/05/2019 13:27:04 Brinkmeier, Tanyika C. ([id number]) -------------------------------------------------------------------------------- Lower Extremity Assessment Details Patient Name: Trippe, [name] Date of Service: 06/05/2019 1:00 PM Medical Record Number: [id number] Patient Account Number: [id number] Date of Birth/Sex: 10/04/1923 (85 y.o. F) Treating RN: Harold Barban Primary Care Elimelech Houseman: Paulita Cradle Other Clinician: Referring Kasondra Junod: Paulita Cradle Treating Valen Mascaro/Extender: Tito Dine in Treatment: 7 Edema Assessment Assessed: [Left: No] [Right: No] [Left: Edema] [Right: :] Calf Left: Right: Point of Measurement: 32 cm From Medial Instep 32.4 cm cm Ankle Left: Right: Point of Measurement: 10 cm From Medial Instep 17.6 cm cm Vascular Assessment Pulses: Dorsalis Pedis Palpable: [Left:Yes] Posterior Tibial Palpable: [Left:Yes] Electronic Signature(s) Signed: 06/05/2019 4:19:58 PM By: Harold Barban Entered By: Harold Barban on 06/05/2019 13:19:01 Hinson, Herta C. ([id number]) -------------------------------------------------------------------------------- Multi Wound Chart Details Patient Name: Willems, [name] Date of Service: 06/05/2019 1:00 PM Medical Record Number: [id number] Patient Account Number: [id number] Date of Birth/Sex: 1924/01/06 (85 y.o. F) Treating RN: Cornell Barman Primary Care Evett Kassa: Paulita Cradle Other Clinician: Referring  Tyna Huertas: Paulita Cradle Treating Daisee Centner/Extender: Tito Dine in Treatment: 7 Vital Signs Height(in): 65 Pulse(bpm): 51 Weight(lbs): 110 Blood Pressure(mmHg): 97/46 Body Mass Index(BMI): 18 Temperature(F): 98.4 Respiratory Rate 16 (breaths/min): Photos: [N/A:N/A] Wound Location: Left, Anterior Lower Leg N/A N/A Wounding Event: Trauma N/A N/A Primary Etiology: Trauma, Other N/A N/A Comorbid History: Cataracts, Arrhythmia, N/A N/A Congestive Heart Failure, End Stage Renal Disease Date Acquired: 04/14/2019 N/A N/A Weeks of Treatment: 7 N/A N/A Wound Status: Healed - Epithelialized N/A  N/A Measurements L x W x D 0x0x0 N/A N/A (cm) Area (cm) : 0 N/A N/A Volume (cm) : 0 N/A N/A % Reduction in Area: 100.00% N/A N/A % Reduction in Volume: 100.00% N/A N/A Classification: Full Thickness Without N/A N/A Exposed Support Structures Exudate Amount: None Present N/A N/A Wound Margin: Indistinct, nonvisible N/A N/A Granulation Amount: Large (67-100%) N/A N/A Necrotic Amount: None Present (0%) N/A N/A Exposed Structures: Fascia: No N/A N/A Fat Layer (Subcutaneous Tissue) Exposed: No Tendon: No Muscle: No Joint: No Bone: No Epithelialization: Large (67-100%) N/A N/A Laumann, Dasani C. ([id number]) Treatment Notes Electronic Signature(s) Signed: 06/05/2019 5:06:38 PM By: Linton Ham MD Entered By: Linton Ham on 06/05/2019 13:28:09 Girten, Emilyann C. ([id number]) -------------------------------------------------------------------------------- [place] Details Patient Name: Franks, [name] Date of Service: 06/05/2019 1:00 PM Medical Record Number: [id number] Patient Account Number: [id number] Date of Birth/Sex: 12-Jun-1924 (85 y.o. F) Treating RN: Cornell Barman Primary Care Asyah Candler: Paulita Cradle Other Clinician: Referring Latrelle Bazar: Paulita Cradle Treating Alicia Ackert/Extender: Tito Dine in Treatment:  7 Active Inactive Electronic Signature(s) Signed: 06/05/2019 4:31:46 PM By: Gretta Cool, BSN, RN, CWS, Kim RN, BSN Entered By: Gretta Cool, BSN, RN, CWS, Kim on 06/05/2019 13:25:20 [place], Rigoberto Noel ([id number]) -------------------------------------------------------------------------------- Pain Assessment Details Patient Name: Grayer, Aylin C. Date of Service: 06/05/2019 1:00 PM Medical Record Number: [id number] Patient Account Number: [id number] Date of Birth/Sex: 07-26-1924 (85 y.o. F) Treating RN: Harold Barban Primary Care Dorinda Stehr: Paulita Cradle Other Clinician: Referring Renette Hsu: Paulita Cradle Treating Blondell Laperle/Extender: Tito Dine in Treatment: 7 Active Problems Location of Pain Severity and Description of Pain Patient Has Paino No Site Locations Pain Management and Medication Current Pain Management: Electronic Signature(s) Signed: 06/05/2019 4:19:58 PM By: Harold Barban Entered By: Harold Barban on 06/05/2019 13:07:43 [place], Betzaida C. ([id number]) -------------------------------------------------------------------------------- Patient/Caregiver Education Details Patient Name: Denardo, [name] Date of Service: 06/05/2019 1:00 PM Medical Record Number: [id number] Patient Account Number: [id number] Date of Birth/Gender: [birth year] (85 y.o. F) Treating RN: Cornell Barman Primary Care Physician: Paulita Cradle Other Clinician: Referring Physician: Paulita Cradle Treating Physician/Extender: Tito Dine in Treatment: 7 Education Assessment Education Provided To: Patient Education Topics Provided Wound/Skin Impairment: Handouts: Other: lotion daily Methods: Demonstration Responses: Return demonstration correctly, State content correctly Electronic Signature(s) Signed: 06/05/2019 4:31:46 PM By: Gretta Cool, BSN, RN, CWS, Kim RN, BSN Entered By: Gretta Cool, BSN, RN, CWS, Kim on 06/05/2019 13:26:40 Largo, Emiliya C.  ([id number]) -------------------------------------------------------------------------------- Wound Assessment Details Patient Name: Zillmer, [name] Date of Service: 06/05/2019 1:00 PM Medical Record Number: [id number] Patient Account Number: [id number] Date of Birth/Sex: 06-04-1924 (85 y.o. F) Treating RN: Cornell Barman Primary Care Ronique Simerly: Paulita Cradle Other Clinician: Referring Lorain Keast: Paulita Cradle Treating Gita Dilger/Extender: Tito Dine in Treatment: 7 Wound Status Wound Number: 2 Primary Trauma, Other Etiology: Wound Location: Left, Anterior Lower Leg Wound Healed - Epithelialized Wounding Event: Trauma Status: Date Acquired: 04/14/2019 Comorbid Cataracts, Arrhythmia, Congestive Heart Weeks Of Treatment: 7 History: Failure, End Stage Renal Disease Clustered Wound: No Photos Wound Measurements Length: (cm) 0 % Reduct Width: (cm) 0 % Reduct Depth: (cm) 0 Epitheli Area: (cm) 0 Tunneli Volume: (cm) 0 Undermi ion in Area: 100% ion in Volume: 100% alization: Large (67-100%) ng: No ning: No Wound Description Full Thickness Without Exposed Support Foul Odo Classification: Structures Slough/F Wound Margin: Indistinct, nonvisible Exudate None Present Amount: r After Cleansing: No ibrino No Wound Bed Granulation Amount: Large (67-100%) Exposed Structure Necrotic Amount: None Present (0%) Fascia Exposed: No Fat Layer (Subcutaneous Tissue) Exposed: No Tendon  Exposed: No Muscle Exposed: No Joint Exposed: No Bone Exposed: No Electronic Signature(s) Signed: 06/05/2019 4:31:46 PM By: Gretta Cool, BSN, RN, CWS, Kim RN, BSN [address], [place] ([id number]) Entered By: Gretta Cool, BSN, RN, CWS, Kim on 06/05/2019 13:25:08 Schlauch, Rigoberto Noel ([id number]) -------------------------------------------------------------------------------- [place] Details Patient Name: Kotter, [name] Date of Service: 06/05/2019 1:00 PM Medical Record Number:  [id number] Patient Account Number: [id number] Date of Birth/Sex: 1923-12-22 (85 y.o. F) Treating RN: Harold Barban Primary Care Aerion Bagdasarian: Paulita Cradle Other Clinician: Referring Cortlin Marano: Paulita Cradle Treating Rosamond Andress/Extender: Tito Dine in Treatment: 7 Vital Signs Time Taken: 13:05 Temperature (F): 98.4 Height (in): 65 Pulse (bpm): 51 Weight (lbs): 110 Respiratory Rate (breaths/min): 16 Body Mass Index (BMI): 18.3 Blood Pressure (mmHg): 97/46 Reference Range: 80 - 120 mg / dl Electronic Signature(s) Signed: 06/05/2019 4:19:58 PM By: Harold Barban Entered By: Harold Barban on 06/05/2019 13:09:02

## 2019-08-23 ENCOUNTER — Emergency Department: Payer: Medicare Other

## 2019-08-23 ENCOUNTER — Encounter: Payer: Self-pay | Admitting: Emergency Medicine

## 2019-08-23 ENCOUNTER — Other Ambulatory Visit: Payer: Self-pay

## 2019-08-23 ENCOUNTER — Emergency Department
Admission: EM | Admit: 2019-08-23 | Discharge: 2019-08-23 | Disposition: A | Discharge status: Home / Self Care | Payer: Medicare Other | Attending: Emergency Medicine | Admitting: Emergency Medicine

## 2019-08-23 DIAGNOSIS — Y9389 Activity, other specified: Secondary | ICD-10-CM | POA: Insufficient documentation

## 2019-08-23 DIAGNOSIS — I5032 Chronic diastolic (congestive) heart failure: Secondary | ICD-10-CM | POA: Diagnosis not present

## 2019-08-23 DIAGNOSIS — Z20822 Contact with and (suspected) exposure to covid-19: Secondary | ICD-10-CM | POA: Insufficient documentation

## 2019-08-23 DIAGNOSIS — Z7982 Long term (current) use of aspirin: Secondary | ICD-10-CM | POA: Insufficient documentation

## 2019-08-23 DIAGNOSIS — I4891 Unspecified atrial fibrillation: Secondary | ICD-10-CM | POA: Insufficient documentation

## 2019-08-23 DIAGNOSIS — W19XXXA Unspecified fall, initial encounter: Secondary | ICD-10-CM

## 2019-08-23 DIAGNOSIS — R1012 Left upper quadrant pain: Secondary | ICD-10-CM | POA: Insufficient documentation

## 2019-08-23 DIAGNOSIS — W06XXXA Fall from bed, initial encounter: Secondary | ICD-10-CM | POA: Diagnosis not present

## 2019-08-23 DIAGNOSIS — Y92013 Bedroom of single-family (private) house as the place of occurrence of the external cause: Secondary | ICD-10-CM | POA: Diagnosis not present

## 2019-08-23 DIAGNOSIS — Y999 Unspecified external cause status: Secondary | ICD-10-CM | POA: Diagnosis not present

## 2019-08-23 DIAGNOSIS — I509 Heart failure, unspecified: Secondary | ICD-10-CM | POA: Insufficient documentation

## 2019-08-23 DIAGNOSIS — Z79899 Other long term (current) drug therapy: Secondary | ICD-10-CM | POA: Diagnosis not present

## 2019-08-23 DIAGNOSIS — N1831 Chronic kidney disease, stage 3a: Secondary | ICD-10-CM

## 2019-08-23 DIAGNOSIS — I480 Paroxysmal atrial fibrillation: Secondary | ICD-10-CM | POA: Diagnosis not present

## 2019-08-23 DIAGNOSIS — S0990XA Unspecified injury of head, initial encounter: Secondary | ICD-10-CM | POA: Diagnosis present

## 2019-08-23 DIAGNOSIS — S0083XA Contusion of other part of head, initial encounter: Secondary | ICD-10-CM | POA: Insufficient documentation

## 2019-08-23 LAB — COMPREHENSIVE METABOLIC PANEL
ALT: 17 U/L (ref 0–44)
AST: 34 U/L (ref 15–41)
Albumin: 4 g/dL (ref 3.5–5.0)
Alkaline Phosphatase: 86 U/L (ref 38–126)
Anion gap: 11 (ref 5–15)
BUN: 33 mg/dL — ABNORMAL HIGH (ref 8–23)
CO2: 29 mmol/L (ref 22–32)
Calcium: 9.1 mg/dL (ref 8.9–10.3)
Chloride: 99 mmol/L (ref 98–111)
Creatinine, Ser: 1.76 mg/dL — ABNORMAL HIGH (ref 0.44–1.00)
GFR calc Af Amer: 28 mL/min — ABNORMAL LOW (ref >60)
GFR calc non Af Amer: 24 mL/min — ABNORMAL LOW (ref >60)
Glucose, Bld: 127 mg/dL — ABNORMAL HIGH (ref 70–99)
Potassium: 4.2 mmol/L (ref 3.5–5.1)
Sodium: 139 mmol/L (ref 135–145)
Total Bilirubin: 1.4 mg/dL — ABNORMAL HIGH (ref 0.3–1.2)
Total Protein: 6.5 g/dL (ref 6.5–8.1)

## 2019-08-23 LAB — CBC WITH DIFFERENTIAL/PLATELET
Abs Immature Granulocytes: 0.02 10*3/uL (ref 0.00–0.07)
Basophils Absolute: 0 10*3/uL (ref 0.0–0.1)
Basophils Relative: 1 %
Eosinophils Absolute: 0.1 10*3/uL (ref 0.0–0.5)
Eosinophils Relative: 3 %
HCT: 41.2 % (ref 36.0–46.0)
Hemoglobin: 13.5 g/dL (ref 12.0–15.0)
Immature Granulocytes: 1 %
Lymphocytes Relative: 10 %
Lymphs Abs: 0.4 10*3/uL — ABNORMAL LOW (ref 0.7–4.0)
MCH: 31.4 pg (ref 26.0–34.0)
MCHC: 32.8 g/dL (ref 30.0–36.0)
MCV: 95.8 fL (ref 80.0–100.0)
Monocytes Absolute: 0.5 10*3/uL (ref 0.1–1.0)
Monocytes Relative: 12 %
Neutro Abs: 3 10*3/uL (ref 1.7–7.7)
Neutrophils Relative %: 73 %
Platelets: 144 10*3/uL — ABNORMAL LOW (ref 150–400)
RBC: 4.3 MIL/uL (ref 3.87–5.11)
RDW: 15.7 % — ABNORMAL HIGH (ref 11.5–15.5)
WBC: 4.2 10*3/uL (ref 4.0–10.5)
nRBC: 0 % (ref 0.0–0.2)

## 2019-08-23 LAB — TROPONIN I (HIGH SENSITIVITY)
Troponin I (High Sensitivity): 66 ng/L — ABNORMAL HIGH (ref <18)
Troponin I (High Sensitivity): 73 ng/L — ABNORMAL HIGH (ref <18)

## 2019-08-23 LAB — BRAIN NATRIURETIC PEPTIDE: B Natriuretic Peptide: 2636 pg/mL — ABNORMAL HIGH (ref 0.0–100.0)

## 2019-08-23 LAB — SARS CORONAVIRUS 2 (TAT 6-24 HRS): SARS Coronavirus 2: NEGATIVE

## 2019-08-23 MED ORDER — FUROSEMIDE 10 MG/ML IJ SOLN
40.0000 mg | Freq: Once | INTRAMUSCULAR | Status: DC
  Filled 2019-08-23: qty 4

## 2019-08-23 NOTE — ED Triage Notes (Signed)
Presents vis EMS from home  Family states she fell from bed  Hit her head on side table  Bruising noted  Also having some pain to left rib area

## 2019-08-23 NOTE — Consult Note (Signed)
Hospitalist Service Medical Consultation   Cristina [name]  [id number]  DOB: [birth year]  DOA: 08/23/2019  PCP: [facility]   Outpatient Specialists: Patient followed at [facility] physician: Dr. Evern Bio  Reason for consultation: Admission   History of Present Illness: Cristina [name] is an 86 y.o. female with past medical history significant for mixed dementia, atrial fibrillation, diastolic heart disease, stage IIIa CKD and chronic lower extremity edema who was sent in by her daughter for a fall sustained yesterday.  Patient apparently fell yesterday and hit her head against her bedside table.  She was helped up by her husband and slept in a recliner all night.  This morning when her daughter went to see her she noted that her mother had a large ecchymosis over her left maxillary area and she was complaining about pain under her left arm. Ms. Cristina [name] states that she "wanted her mother to get checked out".  Patient herself states that she feels fine.  She denies any headache.  She does admit that she has pain in her chest and twinges if she moves too fast.  She does not think that she feels dizzy now and she does not remember how she fell but does note that it was dark.  She has no other complaints.  Patient's daughter states that her mother has been her usual self.  She has not noticed any fevers chills or sweats.  Her mother has not had any vomiting or diarrhea.  She does not believe her mother has had any shortness of breath.  She does note that her mother has always had an extremely poor appetite because she is always felt that she was too heavy.  She has apparently not been eating breakfast for several days and in general is not a good eater.  Also of note Ms. Cristina [name] states that she has been giving her mother increased torsemide for the last couple of days to treat lower extremity edema as instructed by her cardiologist.  The  patient was evaluated in the ED and noted to be afebrile, normotensive without tachycardia and 99% oxygenation on room air. CT of chest abdomen pelvis head and maxillofacial were essentially normal although CT chest did show some mild interstitial edema with some small pleural effusions.  Laboratory data were notable for a creatinine of 1.7 up from 1.43 years ago.  She was also noted to have T wave inversions inferiorly as well as across precordial leads.  Initial hstroponin was 66 and repeat was 77.  Hospitalist were called to admit for abnormal EKG and acute renal insufficiency with nonnormal troponins.  Tried hospitalist were called to admit.   Review of Systems:  As per HPI otherwise 10 point review of systems negative.   Past Medical History: Past Medical History:  Diagnosis Date   Atrial fibrillation ([facility])    Atrial septal defect    Congestive heart failure (HCC)    Diastolic dysfunction   Diverticulosis    DJD (degenerative joint disease)    Neuropathy    Osteoporosis    Punctate hemorrhage of left frontal lobe (HCC)    after head trauma in Nov 2016   Severe mitral regurgitation     Past Surgical History: Past Surgical History:  Procedure Laterality Date   BREAST CYST EXCISION     cataracts     Bilateral   HIP FRACTURE SURGERY Left 2013  SHOULDER SURGERY       Allergies:   Allergies  Allergen Reactions   Apixaban Rash     Social History:  reports that she has never smoked. She has never used smokeless tobacco. She reports that she does not drink alcohol or use drugs.   Family History: Family History  Problem Relation Age of Onset   Hypertension Father    Leukemia Father    Thyroid cancer Mother      Physical Exam: Vitals:   08/23/19 1209 08/23/19 1210 08/23/19 1510  BP: 108/66  110/67  Pulse:  72 70  Resp: 18  18  Temp: (!) 97.5 F (36.4 C)    TempSrc: Oral    SpO2: 99%  99%  Weight:  56.7 kg   Height:  5\' 5"  (1.651 m)     Blood pressure 110/67, pulse 70, temperature (!) 97.5 F (36.4 C), temperature source Oral, resp. rate 18, height 5\' 5"  (1.651 m), weight 56.7 kg, SpO2 99 %. Gen: Frail elderly female with ecchymoses over her left orbital area and bitemporal wasting lying in bed in no acute distress.  She is awake and alert and communicative and friendly. Constitutional: Alert and awake, oriented x2, not in any acute distress. Eyes: sclera anicteric, conjuctiva mildly injected bilaterally CVS: [id number] clear 2/6 systolic murmur with radiation to the apex.   Respiratory: Normal effort, symmetrical excursion, CTA without adventitious sounds.  GI: NABS, soft, NT, ND, no palpable masses.  LE: Patient has 1-2+ edema in her lower extremities with circumferential postinflammatory hyperpigmentation and scaling consistent with resolving stasis dermatitis.  No increased in warmth or redness noted. Neuro: A/O x 2, Moving all extremities equally with normal strength, CN 3-12 intact, grossly nonfocal.    Data reviewed:  I have personally reviewed following labs and imaging studies Labs:  CBC: Recent Labs  Lab 08/23/19 1241  WBC 4.2  NEUTROABS 3.0  HGB 13.5  HCT 41.2  MCV 95.8  PLT 144*    Basic Metabolic Panel: Recent Labs  Lab 08/23/19 1241  NA 139  K 4.2  CL 99  CO2 29  GLUCOSE 127*  BUN 33*  CREATININE 1.76*  CALCIUM 9.1   GFR Estimated Creatinine Clearance: 17.9 mL/min (A) (by C-G formula based on SCr of 1.76 mg/dL (H)). Liver Function Tests: Recent Labs  Lab 08/23/19 1241  AST 34  ALT 17  ALKPHOS 86  BILITOT 1.4*  PROT 6.5  ALBUMIN 4.0   No results for input(s): LIPASE, AMYLASE in the last 168 hours. No results for input(s): AMMONIA in the last 168 hours. Coagulation profile No results for input(s): INR, PROTIME in the last 168 hours.  Cardiac Enzymes: No results for input(s): CKTOTAL, CKMB, CKMBINDEX, TROPONINI in the last 168 hours. BNP: Invalid input(s): POCBNP CBG: No  results for input(s): GLUCAP in the last 168 hours. D-Dimer No results for input(s): DDIMER in the last 72 hours. Hgb A1c No results for input(s): HGBA1C in the last 72 hours. Lipid Profile No results for input(s): CHOL, HDL, LDLCALC, TRIG, CHOLHDL, LDLDIRECT in the last 72 hours. Thyroid function studies No results for input(s): TSH, T4TOTAL, T3FREE, THYROIDAB in the last 72 hours.  Invalid input(s): FREET3 Anemia work up No results for input(s): VITAMINB12, FOLATE, FERRITIN, TIBC, IRON, RETICCTPCT in the last 72 hours. Urinalysis    Component Value Date/Time   COLORURINE YELLOW (A) 10/02/2016 2006   APPEARANCEUR CLEAR (A) 10/02/2016 2006   LABSPEC 1.013 10/02/2016 2006   PHURINE 6.0 10/02/2016 2006  GLUCOSEU NEGATIVE 10/02/2016 2006   [place] NEGATIVE 10/02/2016 2006   [place] NEGATIVE 10/02/2016 2006   [place] NEGATIVE 10/02/2016 2006   PROTEINUR NEGATIVE 10/02/2016 2006   NITRITE NEGATIVE 10/02/2016 2006   LEUKOCYTESUR NEGATIVE 10/02/2016 2006     Sepsis Labs Invalid input(s): PROCALCITONIN,  WBC,  LACTICIDVEN Microbiology No results found for this or any previous visit (from the past 240 hour(s)).     Inpatient Medications:   Scheduled Meds: Continuous Infusions:   Radiological Exams on Admission: CT ABDOMEN PELVIS WO CONTRAST  Result Date: 08/23/2019 CLINICAL DATA:  Fall from bed, bruising, left rib pain EXAM: CT CHEST, ABDOMEN AND PELVIS WITHOUT CONTRAST TECHNIQUE: Multidetector CT imaging of the chest, abdomen and pelvis was performed following the standard protocol without IV contrast. COMPARISON:  CT abdomen/pelvis dated 06/02/2016 FINDINGS: CT CHEST FINDINGS Cardiovascular: Cardiomegaly. Moderate pericardial effusion, mildly increased. No evidence of thoracic aortic aneurysm. Atherosclerotic calcifications of the aortic arch. Mild coronary atherosclerosis the LAD and right coronary artery. Mediastinum/Nodes: No suspicious mediastinal lymphadenopathy.  Visualized thyroid is unremarkable. Lungs/Pleura: Biapical pleural-parenchymal scarring. Evaluation of the lung parenchyma is constrained by respiratory motion. Within that constraint, there are no suspicious pulmonary nodules. Very mild faint ground-glass opacities with interlobular septal thickening in the lungs bilaterally, favoring mild interstitial edema. Small bilateral pleural effusions, right greater than left. Mild bibasilar opacities, left greater than right, likely atelectasis. No pneumothorax. Musculoskeletal: Mild superior endplate compression fracture deformity at T12, chronic. No rib fracture is seen. CT ABDOMEN PELVIS FINDINGS Motion degraded images. Hepatobiliary: Unenhanced liver is grossly unremarkable. Layering small gallstones (series 3/image 95), without associated inflammatory changes. No intrahepatic or extrahepatic ductal dilatation. Pancreas: Within normal limits. Spleen: Within normal limits. Adrenals/Urinary Tract: Adrenal glands are within normal limits. Slightly malrotated right kidney. Left kidney is grossly unremarkable. No renal calculi or hydronephrosis. Bladder is underdistended but unremarkable. Stomach/Bowel: Stomach is within normal limits. No evidence of bowel obstruction. Extensive sigmoid diverticulosis, without evidence of diverticulitis. Vascular/Lymphatic: No evidence of abdominal aortic aneurysm. Atherosclerotic calcifications of the abdominal aorta and branch vessels. No suspicious abdominopelvic lymphadenopathy. Reproductive: Uterus is unremarkable. No adnexal masses. Other: Small volume pelvic ascites. Musculoskeletal: Mild degenerative changes of the lumbar spine. No fracture is seen. Status post ORIF of the left hip. IMPRESSION: Motion degraded images. No evidence of traumatic injury to the chest, abdomen, or pelvis. Cardiomegaly with moderate pericardial effusion. Suspected mild interstitial edema. Small bilateral pleural effusions. Cholelithiasis, without  associated inflammatory changes. Small volume pelvic ascites. Additional ancillary findings as above. Electronically Signed   By: Julian Hy M.D.   On: 08/23/2019 14:31   CT Head Wo Contrast  Result Date: 08/23/2019 CLINICAL DATA:  [place] from bed striking head on side table, bruising, history atrial fibrillation, CHF EXAM: CT HEAD WITHOUT CONTRAST CT MAXILLOFACIAL WITHOUT CONTRAST CT CERVICAL SPINE WITHOUT CONTRAST TECHNIQUE: Multidetector CT imaging of the head, cervical spine, and maxillofacial structures were performed using the standard protocol without intravenous contrast. Multiplanar CT image reconstructions of the cervical spine and maxillofacial structures were also generated. Right side of face marked with BB. COMPARISON:  CT head 09/21/2016, CT cervical spine 07/06/2015 FINDINGS: CT HEAD FINDINGS Brain: Generalized atrophy. Normal ventricular morphology. No midline shift or mass effect. Small vessel chronic ischemic changes of deep cerebral white matter. Small old RIGHT cerebellar hemisphere infarct. Small calcifications at the basal ganglia again noted. No intracranial hemorrhage, mass lesion, or evidence of acute infarction. No extra-axial fluid collections. Vascular: Atherosclerotic calcification of internal carotid arteries at skull base. Skull:  Demineralized but intact Other: N/A CT MAXILLOFACIAL FINDINGS Osseous: Osseous demineralization. Degenerative changes of the temporomandibular joints bilaterally. Scattered beam hardening artifacts of dental origin. Nasal septal deviation to the RIGHT. No facial bone fractures seen. Orbits: Bony orbits intact. Intraorbital soft tissue planes clear. No orbital fluid or pneumatosis. Sinuses: Paranasal sinuses, mastoid air cells, and middle ear cavities clear. Soft tissues: LEFT periorbital and pre maxillary soft tissue swelling extending laterally in LEFT face. CT CERVICAL SPINE FINDINGS Alignment: Minimal anterolisthesis C3-C4 unchanged. Remaining  alignments normal. Skull base and vertebrae: Diffuse osseous demineralization. Disc space narrowing and endplate spur formation C3-C4 through C6-C7. Multilevel facet degenerative changes. No fracture, additional subluxation, or bone destruction. Soft tissues and spinal canal: Prevertebral soft tissues normal thickness. Disc levels:  No significant abnormalities. Upper chest: Minimal biapical scarring Other: N/A IMPRESSION: Atrophy with small vessel chronic ischemic changes of deep cerebral white matter. Old RIGHT cerebellar infarct. No acute intracranial abnormalities. No acute facial bone abnormalities. Osseous demineralization with degenerative disc and facet disease changes of the cervical spine. No acute cervical spine abnormalities. Electronically Signed   By: Lavonia Dana M.D.   On: 08/23/2019 14:34   CT CHEST WO CONTRAST  Result Date: 08/23/2019 CLINICAL DATA:  Fall from bed, bruising, left rib pain EXAM: CT CHEST, ABDOMEN AND PELVIS WITHOUT CONTRAST TECHNIQUE: Multidetector CT imaging of the chest, abdomen and pelvis was performed following the standard protocol without IV contrast. COMPARISON:  CT abdomen/pelvis dated 06/02/2016 FINDINGS: CT CHEST FINDINGS Cardiovascular: Cardiomegaly. Moderate pericardial effusion, mildly increased. No evidence of thoracic aortic aneurysm. Atherosclerotic calcifications of the aortic arch. Mild coronary atherosclerosis the LAD and right coronary artery. Mediastinum/Nodes: No suspicious mediastinal lymphadenopathy. Visualized thyroid is unremarkable. Lungs/Pleura: Biapical pleural-parenchymal scarring. Evaluation of the lung parenchyma is constrained by respiratory motion. Within that constraint, there are no suspicious pulmonary nodules. Very mild faint ground-glass opacities with interlobular septal thickening in the lungs bilaterally, favoring mild interstitial edema. Small bilateral pleural effusions, right greater than left. Mild bibasilar opacities, left greater  than right, likely atelectasis. No pneumothorax. Musculoskeletal: Mild superior endplate compression fracture deformity at T12, chronic. No rib fracture is seen. CT ABDOMEN PELVIS FINDINGS Motion degraded images. Hepatobiliary: Unenhanced liver is grossly unremarkable. Layering small gallstones (series 3/image 95), without associated inflammatory changes. No intrahepatic or extrahepatic ductal dilatation. Pancreas: Within normal limits. Spleen: Within normal limits. Adrenals/Urinary Tract: Adrenal glands are within normal limits. Slightly malrotated right kidney. Left kidney is grossly unremarkable. No renal calculi or hydronephrosis. Bladder is underdistended but unremarkable. Stomach/Bowel: Stomach is within normal limits. No evidence of bowel obstruction. Extensive sigmoid diverticulosis, without evidence of diverticulitis. Vascular/Lymphatic: No evidence of abdominal aortic aneurysm. Atherosclerotic calcifications of the abdominal aorta and branch vessels. No suspicious abdominopelvic lymphadenopathy. Reproductive: Uterus is unremarkable. No adnexal masses. Other: Small volume pelvic ascites. Musculoskeletal: Mild degenerative changes of the lumbar spine. No fracture is seen. Status post ORIF of the left hip. IMPRESSION: Motion degraded images. No evidence of traumatic injury to the chest, abdomen, or pelvis. Cardiomegaly with moderate pericardial effusion. Suspected mild interstitial edema. Small bilateral pleural effusions. Cholelithiasis, without associated inflammatory changes. Small volume pelvic ascites. Additional ancillary findings as above. Electronically Signed   By: Julian Hy M.D.   On: 08/23/2019 14:31   CT CERVICAL SPINE WO CONTRAST  Result Date: 08/23/2019 CLINICAL DATA:  [place] from bed striking head on side table, bruising, history atrial fibrillation, CHF EXAM: CT HEAD WITHOUT CONTRAST CT MAXILLOFACIAL WITHOUT CONTRAST CT CERVICAL SPINE WITHOUT CONTRAST TECHNIQUE: Multidetector CT  imaging of the head, cervical spine, and maxillofacial structures were performed using the standard protocol without intravenous contrast. Multiplanar CT image reconstructions of the cervical spine and maxillofacial structures were also generated. Right side of face marked with BB. COMPARISON:  CT head 09/21/2016, CT cervical spine 07/06/2015 FINDINGS: CT HEAD FINDINGS Brain: Generalized atrophy. Normal ventricular morphology. No midline shift or mass effect. Small vessel chronic ischemic changes of deep cerebral white matter. Small old RIGHT cerebellar hemisphere infarct. Small calcifications at the basal ganglia again noted. No intracranial hemorrhage, mass lesion, or evidence of acute infarction. No extra-axial fluid collections. Vascular: Atherosclerotic calcification of internal carotid arteries at skull base. Skull: Demineralized but intact Other: N/A CT MAXILLOFACIAL FINDINGS Osseous: Osseous demineralization. Degenerative changes of the temporomandibular joints bilaterally. Scattered beam hardening artifacts of dental origin. Nasal septal deviation to the RIGHT. No facial bone fractures seen. Orbits: Bony orbits intact. Intraorbital soft tissue planes clear. No orbital fluid or pneumatosis. Sinuses: Paranasal sinuses, mastoid air cells, and middle ear cavities clear. Soft tissues: LEFT periorbital and pre maxillary soft tissue swelling extending laterally in LEFT face. CT CERVICAL SPINE FINDINGS Alignment: Minimal anterolisthesis C3-C4 unchanged. Remaining alignments normal. Skull base and vertebrae: Diffuse osseous demineralization. Disc space narrowing and endplate spur formation C3-C4 through C6-C7. Multilevel facet degenerative changes. No fracture, additional subluxation, or bone destruction. Soft tissues and spinal canal: Prevertebral soft tissues normal thickness. Disc levels:  No significant abnormalities. Upper chest: Minimal biapical scarring Other: N/A IMPRESSION: Atrophy with small vessel chronic  ischemic changes of deep cerebral white matter. Old RIGHT cerebellar infarct. No acute intracranial abnormalities. No acute facial bone abnormalities. Osseous demineralization with degenerative disc and facet disease changes of the cervical spine. No acute cervical spine abnormalities. Electronically Signed   By: Lavonia Dana M.D.   On: 08/23/2019 14:34   CT Maxillofacial Wo Contrast  Result Date: 08/23/2019 CLINICAL DATA:  [place] from bed striking head on side table, bruising, history atrial fibrillation, CHF EXAM: CT HEAD WITHOUT CONTRAST CT MAXILLOFACIAL WITHOUT CONTRAST CT CERVICAL SPINE WITHOUT CONTRAST TECHNIQUE: Multidetector CT imaging of the head, cervical spine, and maxillofacial structures were performed using the standard protocol without intravenous contrast. Multiplanar CT image reconstructions of the cervical spine and maxillofacial structures were also generated. Right side of face marked with BB. COMPARISON:  CT head 09/21/2016, CT cervical spine 07/06/2015 FINDINGS: CT HEAD FINDINGS Brain: Generalized atrophy. Normal ventricular morphology. No midline shift or mass effect. Small vessel chronic ischemic changes of deep cerebral white matter. Small old RIGHT cerebellar hemisphere infarct. Small calcifications at the basal ganglia again noted. No intracranial hemorrhage, mass lesion, or evidence of acute infarction. No extra-axial fluid collections. Vascular: Atherosclerotic calcification of internal carotid arteries at skull base. Skull: Demineralized but intact Other: N/A CT MAXILLOFACIAL FINDINGS Osseous: Osseous demineralization. Degenerative changes of the temporomandibular joints bilaterally. Scattered beam hardening artifacts of dental origin. Nasal septal deviation to the RIGHT. No facial bone fractures seen. Orbits: Bony orbits intact. Intraorbital soft tissue planes clear. No orbital fluid or pneumatosis. Sinuses: Paranasal sinuses, mastoid air cells, and middle ear cavities clear. Soft  tissues: LEFT periorbital and pre maxillary soft tissue swelling extending laterally in LEFT face. CT CERVICAL SPINE FINDINGS Alignment: Minimal anterolisthesis C3-C4 unchanged. Remaining alignments normal. Skull base and vertebrae: Diffuse osseous demineralization. Disc space narrowing and endplate spur formation C3-C4 through C6-C7. Multilevel facet degenerative changes. No fracture, additional subluxation, or bone destruction. Soft tissues and spinal canal: Prevertebral soft tissues normal thickness. Disc levels:  No significant abnormalities.  Upper chest: Minimal biapical scarring Other: N/A IMPRESSION: Atrophy with small vessel chronic ischemic changes of deep cerebral white matter. Old RIGHT cerebellar infarct. No acute intracranial abnormalities. No acute facial bone abnormalities. Osseous demineralization with degenerative disc and facet disease changes of the cervical spine. No acute cervical spine abnormalities. Electronically Signed   By: Lavonia Dana M.D.   On: 08/23/2019 14:34    Impression/Recommendations Active Problems:   * No active hospital problems. 15  86 year old female with mixed dementia, atrial fibrillation, CKD 3, HFpEF and chronic lower extremity edema presents with mechanical fall last night after 2 days of increased torsemide and decreased p.o. intake.  At present patient states she feels well other than chest wall tenderness.  EKG does have T wave inversions however these are without change from previous EKG in 2017.  Creatinine of 1.7 is without change since creatinine done at [facility] available through care everywhere.  CT scan shows mild interstitial edema however patient herself denies shortness of breath and she has O2 saturations of 99%.  At this point I see no indication to admit this patient.  She does not appear to have any infectious, cardiac or neurologic reasons for her fall.  Appears to be purely a mechanical fall.  She is not in acute renal failure as her  creatinine is without change since last check.  As noted above her EKG is also without any change.  Patient discussed with daughter Everitt Amber at length.  I recommend patient being discharged home to her usual care.  Would hold torsemide for 1 day and encourage increased oral hydration which the daughter states she will try to do.  I noted that if her mother seemed to be unsteady on her feet or look like she was going to fall again she could bring her back in.  I would avoid any benzodiazepines or narcotics in this frail elderly patient.  Thank you for this consultation.  Our [facility] hospitalist team will follow the patient with you.     Vashti Hey M.D. [facility] 08/23/2019, 5:34 PM Please page [facility] [email or web address] for questions

## 2019-08-23 NOTE — ED Notes (Signed)
Daughter updated by provider

## 2019-08-23 NOTE — Discharge Instructions (Addendum)
Please follow up with her primary care physician. Please seek medical attention for any high fevers, chest pain, shortness of breath, change in behavior, persistent vomiting, bloody stool or any other new or concerning symptoms.

## 2019-08-23 NOTE — ED Provider Notes (Signed)
Cristina [name], attending physician, personally viewed and interpreted this EKG  EKG Time: 1249 Rate: 71 Rhythm: atrial fibrillation Axis: normal Intervals: qtc 471 QRS: RBBB ST changes: no st elevation, st depression and t wave inversion II, III, aVF, v2-v6 Impression: abnormal ekg, when compared to ekg dated 11/03/15, t wave inversion and st depression more pronounced.     Nance Pear, MD 08/23/19 1253

## 2019-08-23 NOTE — ED Notes (Signed)
Up to bathroom with assistance    Informed pt that family was coming to pick her up

## 2019-08-23 NOTE — ED Provider Notes (Signed)
Took over care from [place].  Imaging did not reveal any concerning traumatic injuries.  Blood work did show a slight elevation of creatinine as well as elevated troponin and BNP.  Second troponin was obtained which showed slight increase.  Discussed with hospitalist for admission.  Hospitalist did evaluate.  Did not think patient required admission at this time.  The hospitalist did discuss with patient's family.  Will plan on discharge.  Will give patient follow-up information.   Nance Pear, MD 08/23/19 2002

## 2019-08-23 NOTE — ED Provider Notes (Signed)
[facility] Emergency Department Provider Note  ____________________________________________   First MD Initiated Contact with Patient 08/23/19 1220     (approximate)  I have reviewed the triage vital signs and the nursing notes.   HISTORY  Chief Complaint Fall    HPI Ora Spittler Hennen is a 86 y.o. female presents emergency department after a fall 2 days ago.  Patient rolled out of bed and hit her head on the nightstand.  Family is concerned she continues to complain of left rib pain.  She denies any abdominal pain.  No LOC.  Patient does live at home with her husband and states that she does all of her work and is ambulatory.    Past Medical History:  Diagnosis Date  . Atrial fibrillation ([place])   . Atrial septal defect   . Congestive heart failure (HCC)    Diastolic dysfunction  . Diverticulosis   . DJD (degenerative joint disease)   . Neuropathy   . Osteoporosis   . Punctate hemorrhage of left frontal lobe [facility])    after head trauma in Nov 2016  . Severe mitral regurgitation     Patient Active Problem List   Diagnosis Date Noted  . CVA (cerebral infarction) 11/03/2015    Past Surgical History:  Procedure Laterality Date  . BREAST CYST EXCISION    . cataracts     Bilateral  . HIP FRACTURE SURGERY Left 2013  . SHOULDER SURGERY      Prior to Admission medications   Medication Sig Start Date End Date Taking? Authorizing Provider  aspirin EC 81 MG tablet Take 325 mg by mouth daily.    [provider]  atorvastatin (LIPITOR) 40 MG tablet Take 1 tablet (40 mg total) by mouth daily at 6 PM. 11/06/15   Loletha Grayer, MD  furosemide (LASIX) 20 MG tablet Take 1 tablet (20 mg total) by mouth daily. Patient not taking: Reported on 10/02/2016 11/06/15   Loletha Grayer, MD  metoprolol (LOPRESSOR) 50 MG tablet Take 1 tablet (50 mg total) by mouth 2 (two) times daily. 11/06/15   Loletha Grayer, MD  Multiple Vitamin (MULTIVITAMIN WITH  MINERALS) TABS tablet Take 1 tablet by mouth daily.    [provider]  spironolactone (ALDACTONE) 25 MG tablet Take 1 tablet by mouth daily. 09/17/16   [provider]  torsemide (DEMADEX) 20 MG tablet Take 1 tablet by mouth daily. 09/17/16   [provider]  vitamin B-12 (CYANOCOBALAMIN) 1000 MCG tablet Take 1,000 mcg by mouth daily.    [provider]    Allergies Apixaban  Family History  Problem Relation Age of Onset  . Hypertension Father   . Leukemia Father   . Thyroid cancer Mother     Social History Social History   Tobacco Use  . Smoking status: Never Smoker  . Smokeless tobacco: Never Used  Substance Use Topics  . Alcohol use: No    Alcohol/week: 0.0 standard drinks  . Drug use: No    Review of Systems  Constitutional: No fever/chills, positive headache. Eyes: No visual changes. ENT: No sore throat. Respiratory: Denies cough Cardiovascular: Denies chest pain Gastrointestinal: Denies abdominal pain Genitourinary: Negative for dysuria. Musculoskeletal: Negative for back pain.  Positive rib pain Skin: Negative for rash. Psychiatric: no mood changes,     ____________________________________________   PHYSICAL EXAM:  VITAL SIGNS: ED Triage Vitals  Enc Vitals Group     BP 08/23/19 1209 108/66     Pulse Rate 08/23/19 1210  72     Resp 08/23/19 1209 18     Temp 08/23/19 1209 (!) 97.5 F (36.4 C)     Temp Source 08/23/19 1209 Oral     SpO2 08/23/19 1209 99 %     Weight 08/23/19 1210 125 lb (56.7 kg)     Height 08/23/19 1210 5\' 5"  (1.651 m)     Head Circumference --      Peak Flow --      Pain Score 08/23/19 1210 5     Pain Loc --      Pain Edu? --      Excl. in [place]? --     Constitutional: Alert and oriented. Well appearing and in no acute distress. Eyes: Conjunctivae are normal.  Head: Large amount of bruising noted to the left side of the head and face, some bruising noted on the right side of the lower lip. Nose:  No congestion/rhinnorhea. Mouth/Throat: Mucous membranes are moist.   Neck:  supple no lymphadenopathy noted Cardiovascular: Normal rate, regular rhythm. Heart sounds are normal Respiratory: Normal respiratory effort.  No retractions, lungs c t a, ribs are tender to palpation on the left Abd: soft minimally tender in the left upper quadrant, tender bs normal all 4 quad GU: deferred Musculoskeletal: FROM all extremities, warm and well perfused Neurologic:  Normal speech and language.  Skin:  Skin is warm, dry and intact. No rash noted. Psychiatric: Mood and affect are normal. Speech and behavior are normal.  ____________________________________________   LABS (all labs ordered are listed, but only abnormal results are displayed)  Labs Reviewed  COMPREHENSIVE METABOLIC PANEL - Abnormal; Notable for the following components:      Result Value   Glucose, Bld 127 (*)    BUN 33 (*)    Creatinine, Ser 1.76 (*)    Total Bilirubin 1.4 (*)    GFR calc non Af Amer 24 (*)    GFR calc Af Amer 28 (*)    All other components within normal limits  CBC WITH DIFFERENTIAL/PLATELET - Abnormal; Notable for the following components:   RDW 15.7 (*)    Platelets 144 (*)    Lymphs Abs 0.4 (*)    All other components within normal limits  TROPONIN I (HIGH SENSITIVITY) - Abnormal; Notable for the following components:   Troponin I (High Sensitivity) 66 (*)    All other components within normal limits  BRAIN NATRIURETIC PEPTIDE  TROPONIN I (HIGH SENSITIVITY)   ____________________________________________   ____________________________________________  RADIOLOGY  CT of the head and face CT the chest abdomen pelvis with IV contrast  ____________________________________________   PROCEDURES  Procedure(s) performed: No  Procedures    ____________________________________________   INITIAL IMPRESSION / ASSESSMENT AND PLAN / ED COURSE  Pertinent labs & imaging results that were available  during my care of the patient were reviewed by me and considered in my medical decision making (see chart for details).   Patient is a 86 year old female presents emergency department after a fall.  See HPI  Physical exam patient is able to answer all of my questions.  She does not appear to be in acute distress.  Head and face are bruised, neck is nontender, left ribs are tender  CT of the head maxillofacial, CT of the chest abdomen pelvis with IV contrast  EKG shows some changes from her previous EKG.  Although it does appear to be stable  CBC, metabolic panel, and troponin ordered   Care transferred to Dr. Archie Balboa.  Annika CAIDYN RUNSER was evaluated in Emergency Department on 08/23/2019 for the symptoms described in the history of present illness. She was evaluated in the context of the global COVID-19 pandemic, which necessitated consideration that the patient might be at risk for infection with the SARS-CoV-2 virus that causes COVID-19. Institutional protocols and algorithms that pertain to the evaluation of patients at risk for COVID-19 are in a state of rapid change based on information released by regulatory bodies including the CDC and federal and state organizations. These policies and algorithms were followed during the patient's care in the ED.   As part of my medical decision making, I reviewed the following data within the [place] notes reviewed and incorporated, Labs reviewed , EKG interpreted nonspecific ST-T wave changes, Old chart reviewed, Radiograph reviewed , Evaluated by EM attending Dr. Archie Balboa, Notes from prior ED visits and [place] Controlled Substance Database  ____________________________________________   FINAL CLINICAL IMPRESSION(S) / ED DIAGNOSES  Final diagnoses:  Fall, initial encounter      NEW MEDICATIONS STARTED DURING THIS VISIT:  New Prescriptions   No medications on file     Note:  This document was prepared using Dragon  voice recognition software and may include unintentional dictation errors.    Versie Starks, PA-C 08/23/19 1526    Nance Pear, MD 08/23/19 1535

## 2019-10-04 ENCOUNTER — Inpatient Hospital Stay (HOSPITAL_COMMUNITY): Payer: Medicare Other

## 2019-10-04 ENCOUNTER — Emergency Department (HOSPITAL_COMMUNITY): Payer: Medicare Other

## 2019-10-04 ENCOUNTER — Inpatient Hospital Stay (HOSPITAL_COMMUNITY)
Admission: EM | Admit: 2019-10-04 | Discharge: 2019-10-14 | DRG: 061 | Disposition: E | Payer: Medicare Other | Attending: Neurology | Admitting: Neurology

## 2019-10-04 ENCOUNTER — Other Ambulatory Visit: Payer: Self-pay

## 2019-10-04 ENCOUNTER — Encounter (HOSPITAL_COMMUNITY): Payer: Self-pay | Admitting: Neurology

## 2019-10-04 DIAGNOSIS — I5033 Acute on chronic diastolic (congestive) heart failure: Secondary | ICD-10-CM | POA: Diagnosis not present

## 2019-10-04 DIAGNOSIS — R29719 NIHSS score 19: Secondary | ICD-10-CM | POA: Diagnosis present

## 2019-10-04 DIAGNOSIS — Z20822 Contact with and (suspected) exposure to covid-19: Secondary | ICD-10-CM | POA: Diagnosis present

## 2019-10-04 DIAGNOSIS — R296 Repeated falls: Secondary | ICD-10-CM | POA: Diagnosis present

## 2019-10-04 DIAGNOSIS — L89151 Pressure ulcer of sacral region, stage 1: Secondary | ICD-10-CM | POA: Diagnosis present

## 2019-10-04 DIAGNOSIS — E785 Hyperlipidemia, unspecified: Secondary | ICD-10-CM | POA: Diagnosis present

## 2019-10-04 DIAGNOSIS — I482 Chronic atrial fibrillation, unspecified: Secondary | ICD-10-CM | POA: Diagnosis present

## 2019-10-04 DIAGNOSIS — J9601 Acute respiratory failure with hypoxia: Secondary | ICD-10-CM | POA: Diagnosis present

## 2019-10-04 DIAGNOSIS — R531 Weakness: Secondary | ICD-10-CM

## 2019-10-04 DIAGNOSIS — R54 Age-related physical debility: Secondary | ICD-10-CM | POA: Diagnosis present

## 2019-10-04 DIAGNOSIS — Z8249 Family history of ischemic heart disease and other diseases of the circulatory system: Secondary | ICD-10-CM | POA: Diagnosis not present

## 2019-10-04 DIAGNOSIS — M1990 Unspecified osteoarthritis, unspecified site: Secondary | ICD-10-CM | POA: Diagnosis present

## 2019-10-04 DIAGNOSIS — D696 Thrombocytopenia, unspecified: Secondary | ICD-10-CM | POA: Diagnosis present

## 2019-10-04 DIAGNOSIS — I340 Nonrheumatic mitral (valve) insufficiency: Secondary | ICD-10-CM | POA: Diagnosis not present

## 2019-10-04 DIAGNOSIS — I361 Nonrheumatic tricuspid (valve) insufficiency: Secondary | ICD-10-CM | POA: Diagnosis not present

## 2019-10-04 DIAGNOSIS — Z515 Encounter for palliative care: Secondary | ICD-10-CM | POA: Diagnosis not present

## 2019-10-04 DIAGNOSIS — I63411 Cerebral infarction due to embolism of right middle cerebral artery: Principal | ICD-10-CM | POA: Diagnosis present

## 2019-10-04 DIAGNOSIS — I639 Cerebral infarction, unspecified: Secondary | ICD-10-CM | POA: Diagnosis not present

## 2019-10-04 DIAGNOSIS — I509 Heart failure, unspecified: Secondary | ICD-10-CM

## 2019-10-04 DIAGNOSIS — N179 Acute kidney failure, unspecified: Secondary | ICD-10-CM | POA: Diagnosis present

## 2019-10-04 DIAGNOSIS — G8194 Hemiplegia, unspecified affecting left nondominant side: Secondary | ICD-10-CM | POA: Diagnosis present

## 2019-10-04 DIAGNOSIS — Z888 Allergy status to other drugs, medicaments and biological substances status: Secondary | ICD-10-CM

## 2019-10-04 DIAGNOSIS — Z79899 Other long term (current) drug therapy: Secondary | ICD-10-CM

## 2019-10-04 DIAGNOSIS — R1310 Dysphagia, unspecified: Secondary | ICD-10-CM

## 2019-10-04 DIAGNOSIS — I959 Hypotension, unspecified: Secondary | ICD-10-CM | POA: Diagnosis not present

## 2019-10-04 DIAGNOSIS — R29810 Facial weakness: Secondary | ICD-10-CM | POA: Diagnosis present

## 2019-10-04 DIAGNOSIS — Z66 Do not resuscitate: Secondary | ICD-10-CM | POA: Diagnosis not present

## 2019-10-04 DIAGNOSIS — L8990 Pressure ulcer of unspecified site, unspecified stage: Secondary | ICD-10-CM | POA: Diagnosis present

## 2019-10-04 DIAGNOSIS — R471 Dysarthria and anarthria: Secondary | ICD-10-CM | POA: Diagnosis present

## 2019-10-04 DIAGNOSIS — Z808 Family history of malignant neoplasm of other organs or systems: Secondary | ICD-10-CM

## 2019-10-04 DIAGNOSIS — Z4659 Encounter for fitting and adjustment of other gastrointestinal appliance and device: Secondary | ICD-10-CM

## 2019-10-04 DIAGNOSIS — I4891 Unspecified atrial fibrillation: Secondary | ICD-10-CM | POA: Diagnosis not present

## 2019-10-04 DIAGNOSIS — I9589 Other hypotension: Secondary | ICD-10-CM | POA: Diagnosis not present

## 2019-10-04 DIAGNOSIS — I6340 Cerebral infarction due to embolism of unspecified cerebral artery: Secondary | ICD-10-CM | POA: Diagnosis present

## 2019-10-04 DIAGNOSIS — Z806 Family history of leukemia: Secondary | ICD-10-CM

## 2019-10-04 DIAGNOSIS — I4821 Permanent atrial fibrillation: Secondary | ICD-10-CM | POA: Diagnosis not present

## 2019-10-04 DIAGNOSIS — Z7982 Long term (current) use of aspirin: Secondary | ICD-10-CM

## 2019-10-04 DIAGNOSIS — N184 Chronic kidney disease, stage 4 (severe): Secondary | ICD-10-CM | POA: Diagnosis present

## 2019-10-04 LAB — COMPREHENSIVE METABOLIC PANEL
ALT: 25 U/L (ref 0–44)
AST: 38 U/L (ref 15–41)
Albumin: 3.7 g/dL (ref 3.5–5.0)
Alkaline Phosphatase: 97 U/L (ref 38–126)
Anion gap: 12 (ref 5–15)
BUN: 45 mg/dL — ABNORMAL HIGH (ref 8–23)
CO2: 29 mmol/L (ref 22–32)
Calcium: 9.2 mg/dL (ref 8.9–10.3)
Chloride: 92 mmol/L — ABNORMAL LOW (ref 98–111)
Creatinine, Ser: 1.7 mg/dL — ABNORMAL HIGH (ref 0.44–1.00)
GFR calc Af Amer: 29 mL/min — ABNORMAL LOW (ref >60)
GFR calc non Af Amer: 25 mL/min — ABNORMAL LOW (ref >60)
Glucose, Bld: 106 mg/dL — ABNORMAL HIGH (ref 70–99)
Potassium: 4.7 mmol/L (ref 3.5–5.1)
Sodium: 133 mmol/L — ABNORMAL LOW (ref 135–145)
Total Bilirubin: 1.4 mg/dL — ABNORMAL HIGH (ref 0.3–1.2)
Total Protein: 7.1 g/dL (ref 6.5–8.1)

## 2019-10-04 LAB — I-STAT CHEM 8, ED
BUN: 44 mg/dL — ABNORMAL HIGH (ref 8–23)
Calcium, Ion: 1.01 mmol/L — ABNORMAL LOW (ref 1.15–1.40)
Chloride: 97 mmol/L — ABNORMAL LOW (ref 98–111)
Creatinine, Ser: 1.8 mg/dL — ABNORMAL HIGH (ref 0.44–1.00)
Glucose, Bld: 101 mg/dL — ABNORMAL HIGH (ref 70–99)
HCT: 44 % (ref 36.0–46.0)
Hemoglobin: 15 g/dL (ref 12.0–15.0)
Potassium: 4.7 mmol/L (ref 3.5–5.1)
Sodium: 135 mmol/L (ref 135–145)
TCO2: 31 mmol/L (ref 22–32)

## 2019-10-04 LAB — APTT: aPTT: 25 seconds (ref 24–36)

## 2019-10-04 LAB — ETHANOL: Alcohol, Ethyl (B): <10 mg/dL (ref <10)

## 2019-10-04 LAB — RESPIRATORY PANEL BY RT PCR (FLU A&B, COVID)
Influenza A by PCR: NEGATIVE
Influenza B by PCR: NEGATIVE
SARS Coronavirus 2 by RT PCR: NEGATIVE

## 2019-10-04 LAB — CBC
HCT: 46.1 % — ABNORMAL HIGH (ref 36.0–46.0)
Hemoglobin: 14.8 g/dL (ref 12.0–15.0)
MCH: 31.5 pg (ref 26.0–34.0)
MCHC: 32.1 g/dL (ref 30.0–36.0)
MCV: 98.1 fL (ref 80.0–100.0)
Platelets: 161 10*3/uL (ref 150–400)
RBC: 4.7 MIL/uL (ref 3.87–5.11)
RDW: 14.6 % (ref 11.5–15.5)
WBC: 4.4 10*3/uL (ref 4.0–10.5)
nRBC: 0 % (ref 0.0–0.2)

## 2019-10-04 LAB — DIFFERENTIAL
Abs Immature Granulocytes: 0.01 10*3/uL (ref 0.00–0.07)
Basophils Absolute: 0 10*3/uL (ref 0.0–0.1)
Basophils Relative: 1 %
Eosinophils Absolute: 0.1 10*3/uL (ref 0.0–0.5)
Eosinophils Relative: 2 %
Immature Granulocytes: 0 %
Lymphocytes Relative: 12 %
Lymphs Abs: 0.5 10*3/uL — ABNORMAL LOW (ref 0.7–4.0)
Monocytes Absolute: 0.4 10*3/uL (ref 0.1–1.0)
Monocytes Relative: 10 %
Neutro Abs: 3.3 10*3/uL (ref 1.7–7.7)
Neutrophils Relative %: 75 %

## 2019-10-04 LAB — GLUCOSE, CAPILLARY: Glucose-Capillary: 104 mg/dL — ABNORMAL HIGH (ref 70–99)

## 2019-10-04 LAB — PROTIME-INR
INR: 1.2 (ref 0.8–1.2)
Prothrombin Time: 14.9 seconds (ref 11.4–15.2)

## 2019-10-04 MED ORDER — PANTOPRAZOLE SODIUM 40 MG IV SOLR
40.0000 mg | Freq: Every day | INTRAVENOUS | Status: DC
  Administered 2019-10-04 – 2019-10-05 (×2): 40 mg via INTRAVENOUS
  Filled 2019-10-04 (×2): qty 40

## 2019-10-04 MED ORDER — STROKE: EARLY STAGES OF RECOVERY BOOK: Freq: Once | Status: DC

## 2019-10-04 MED ORDER — SODIUM CHLORIDE 0.9 % IV SOLN: INTRAVENOUS | Status: DC

## 2019-10-04 MED ORDER — SODIUM CHLORIDE 0.9 % IV SOLN: 50.0000 mL/h | INTRAVENOUS | Status: DC

## 2019-10-04 MED ORDER — LABETALOL HCL 5 MG/ML IV SOLN: 10.0000 mg | Freq: Once | INTRAVENOUS | Status: DC | PRN

## 2019-10-04 MED ORDER — ACETAMINOPHEN 325 MG PO TABS: 650.0000 mg | ORAL_TABLET | ORAL | Status: DC | PRN

## 2019-10-04 MED ORDER — IOHEXOL 350 MG/ML SOLN
80.0000 mL | Freq: Once | INTRAVENOUS | Status: AC | PRN
  Administered 2019-10-04: 80 mL via INTRAVENOUS

## 2019-10-04 MED ORDER — SODIUM CHLORIDE 0.9 % IV SOLN: 50.0000 mL | Freq: Once | INTRAVENOUS | Status: DC

## 2019-10-04 MED ORDER — NICARDIPINE HCL IN NACL 20-0.86 MG/200ML-% IV SOLN
0.0000 mg/h | INTRAVENOUS | Status: DC | PRN
  Filled 2019-10-04: qty 200

## 2019-10-04 MED ORDER — ACETAMINOPHEN 160 MG/5ML PO SOLN: 650.0000 mg | ORAL | Status: DC | PRN

## 2019-10-04 MED ORDER — ALTEPLASE (STROKE) FULL DOSE INFUSION
0.9000 mg/kg | Freq: Once | INTRAVENOUS | Status: AC
  Administered 2019-10-04: 41.2 mg via INTRAVENOUS
  Filled 2019-10-04: qty 100

## 2019-10-04 MED ORDER — ACETAMINOPHEN 650 MG RE SUPP: 650.0000 mg | RECTAL | Status: DC | PRN

## 2019-10-04 NOTE — Code Documentation (Signed)
Patient from home.  This afternoon she was sitting in the couch speaking to her husband  At 36 she got up and stumbled when she sat down she was staring to the right.  Ems activated code stroke at [place].  She arrived at 7  Dr Leonel Ramsay assessed her upon arrival.stat head CT done.  TPA started at 1755.  CTA done.  Dr Leonel Ramsay spoke with daughter via the phone.  NIHSS 19  Right gaze preference, Left arm flaccid, left leg some movement, Left visual field cut.  She can speak and will answer questions and follow commands but has difficulty identifying objects.

## 2019-10-04 NOTE — H&P (Addendum)
Admission H&P    Chief Complaint: CODE STROKE HPI: Cristina [name] is an 86 y.o. female with PMH of Afib (not on [place] d/t freq falls and age), septal defect, severe mitral regurgitation, CHF, DJD, neuropathy, TIA, dementia, who resides at home with family and uses a wheelchair and needs some assistance (mRS 3). Today, while sitting on the couch with family she was getting ready for dinner, speaking normally. Moments after she got up to go to kitchen, she suddenly developed left side weakness and slurred speech. This was witnessed by family at 32 who immediately called EMS. Upon arrival to the ER she was found to have dense left hemiparesis, right gaze. She was emergently brought to the CT scanner where there was no bleed on initial imagiang, thus she was treated with IV Alteplase at 1755. CTA showed R MCA M1 occlusion. Case was d/w family by phone who consented to IV tPA treatment, but agree with not pursuing aggressive measures such as thrombectomy. Goals of care were discussed and pt had previously d/w family that she would not want intubation. She will be DNR/DNI but family desires current level of care in an effort to reverse her stroke symptoms.   LSN: [id number] tPA Given: [id number]  Past Medical History Past Medical History:  Diagnosis Date  . Atrial fibrillation ([place])   . Atrial septal defect   . Congestive heart failure (HCC)    Diastolic dysfunction  . Diverticulosis   . DJD (degenerative joint disease)   . Neuropathy   . Osteoporosis   . Punctate hemorrhage of left frontal lobe [facility])    after head trauma in Nov 2016  . Severe mitral regurgitation     Past Surgical History Past Surgical History:  Procedure Laterality Date  . BREAST CYST EXCISION    . cataracts     Bilateral  . HIP FRACTURE SURGERY Left 2013  . SHOULDER SURGERY      Family History Family History  Problem Relation Age of Onset  . Hypertension Father   . Leukemia Father   . Thyroid cancer Mother     Social  History  reports that she has never smoked. She has never used smokeless tobacco. She reports that she does not drink alcohol or use drugs.  Allergies Allergies  Allergen Reactions  . Apixaban Rash     ROS: unable  Physical Examination:  Vitals:   10/12/2019 1700  Weight: 45.8 kg    General - frail, elderly lady in acute distress Heart - Regular rate and rhythm - no murmer Lungs - Clear to auscultation Abdomen - Soft - non tender Extremities - Distal pulses intact - no edema Skin - Warm and dry   Neurologic Examination:   Mental Status:  Alert,difficult to understand any answers to orienation, but seems like she says her name and that month is "Jan". Speech is severely dysarthric. She is able to name 2 objects only on NIH cards. Able to follow simple commands only  Cranial Nerves:  left facial droop, PERRL, forced right gaze, unable to track to left, unable to count in left field of vision.  Motor: Right side is moving weakly, but wnl for baseline debility and purpousful. Left side is densely hemiplegic. Tone and bulk:normal tone throughout; no atrophy noted Sensory: decreased and delayed response noted on left arm/leg Deep Tendon Reflexes: 2/4 throughout Plantars: toes up on left, down on right Cerebellar: unable to preform, no gross ataxia noted Gait: not tested NIHSS 1a Level of Conscious.: 0  1b LOC Questions: 2 1c LOC Commands: 0 2 Best Gaze: 2 3 Visual: 2 4 Facial Palsy: 2 5a Motor Arm - Left: 4 5b Motor Arm - Right: 0 6a Motor Leg - Left: 3 6b Motor Leg - Right: 0 7 Limb Ataxia: 0 8 Sensory: 1 9 Best Language: 2 10 Dysarthria: 2 11 Extinct. and Inatten.: 2 TOTAL: 22  LABORATORY STUDIES   Pending at this time  IMAGING CT HEAD CODE STROKE WO CONTRAST  Result Date: 10/02/2019 CLINICAL DATA:  Code stroke. 86 year old female with left side weakness and rightward gaze. EXAM: CT HEAD WITHOUT CONTRAST TECHNIQUE: Contiguous axial images were obtained from the  base of the skull through the vertex without intravenous contrast. COMPARISON:  Brain MRI 09/30/2016.  Head CT 08/23/2019. FINDINGS: Brain: No intracranial mass effect or midline shift. Stable ventricle size and configuration. No right MCA territory cytotoxic edema identified. Gray-white matter differentiation appears to be stable since January in both hemispheres. Chronic right cerebellar infarct. Small area of chronic right occipital pole encephalomalacia. Vascular: Calcified atherosclerosis at the skull base. Chronic MCA calcified atherosclerosis also noted, but hyperdense right MCA M1 and bifurcation is new. Skull: Osteopenia.  No acute osseous abnormality identified. Sinuses/Orbits: Visualized paranasal sinuses and mastoids are stable and well pneumatized. Other: Rightward gaze. No other acute orbit or scalp soft tissue finding. ASPECTS [facility] Stroke Program Early CT Score) Total score (0-10 with 10 being normal): 10 IMPRESSION: 1. Hyperdense Right MCA and rightward gaze highly suggestive of ELVO, but no acute infarct changes identified in the right MCA territory. ASPECTS 10. 2. No intracranial hemorrhage identified. Small chronic infarct in the right cerebellum. 3. These results were communicated to Dr. Leonel Ramsay at 5:57 pm on 09/18/2019 by text page via the [facility] messaging system. Electronically Signed   By: Genevie Ann M.D.   On: 10/02/2019 17:59    Assessment: 86 y.o. female with PMH of TIA, Afib not on [place] due to freq falls/age. She presented with witnessed RMCA syndrome stroke. She was treated with IV Alteplase. Stroke Risk Factors - Advanced age, TIA, Afib not on OAC, heart failure.   1. Acute Ischemic stroke- RMCA with R M1 clot.  2. Baseline debility with DJD, poor mobility and freq falls- wheelchair and daily assistance at baseline. mRS 3.   3. Afib- family and pt do not wish to be on [place]. ASA post tPA recommende if f/u imaging w/o bleed  Plan:  Admit to ICU for post tPA treatment,  greater than 2 MN needed for stablization  HgbA1c, fasting lipid panel  MRI, brain without contrast for f/u post tPA  PT consult, OT consult, Speech consult  NPO til SLP can eval  Echocardiogram  SCDs post tPA  Risk factor modification  Telemetry monitoring  Frequent neuro checks  Stroke education  Attending Neurologist's Note to Follow  Desiree Metzger-Cihelka, ARNP-C, ANVP-BC Pager: [phone]   I have seen the patient and reviewed the above note.  She presented with a right MCA syndrome and indeed CTA confirmed right M1 occlusion.  At baseline, she is able to use a walker, and can usually use the bathroom on her own, but is unable to complete really any of her other ADLs independently.  She is indicated the past that she would never want to be on life support.  I discussed possible treatment options including TPA, intra-arterial thrombectomy, conservative management with the daughter.  After discussion of risks and benefits of IV TPA, we decided to proceed.  After discussion of  thrombectomy and her baseline status, we decided not to proceed.  We hope at this time that she will recanalize with IV TPA, but if this does not occur, she likely will have a very large stroke and may need to consider goals of care over the next few days.  I discussed with the daughter her CODE STATUS and she is DNR.  This patient is critically ill and at significant risk of neurological worsening, death and care requires constant monitoring of vital signs, hemodynamics,respiratory and cardiac monitoring, neurological assessment, discussion with family, other specialists and medical decision making of high complexity. I spent 45 minutes of neurocritical care time  in the care of  this patient. This was time spent independent of any time provided by nurse practitioner or PA.  Roland Rack, MD [facility] [phone]  If 7pm- 7am, please page neurology on call as listed in  [place]. 09/18/2019  7:17 PM

## 2019-10-04 NOTE — ED Provider Notes (Signed)
[facility] EMERGENCY DEPARTMENT Provider Note   CSN: [id number] Arrival date & time: 09/17/2019  1738  An emergency department physician performed an initial assessment on this suspected stroke patient at (P) [phone].  History Chief Complaint  Patient presents with  . Neurologic Problem    Cristina [name] is a 86 y.o. female.  Patient a code stroke, level 5 caveat due to slurred speech upon arrival.  Left-sided weakness occurred just prior to arrival at 1635.  Was at family who observed sudden weakness and slurred speech.  Per EMS patient with right gaze preference.  The history is provided by the patient.  Neurologic Problem This is a new problem. The current episode started 1 to 2 hours ago. The problem occurs constantly. The problem has not changed since onset.Nothing aggravates the symptoms. Nothing relieves the symptoms. She has tried nothing for the symptoms. The treatment provided no relief.       Past Medical History:  Diagnosis Date  . Atrial fibrillation ([place])   . Atrial septal defect   . Congestive heart failure (HCC)    Diastolic dysfunction  . Diverticulosis   . DJD (degenerative joint disease)   . Neuropathy   . Osteoporosis   . Punctate hemorrhage of left frontal lobe [facility])    after head trauma in Nov 2016  . Severe mitral regurgitation     Patient Active Problem List   Diagnosis Date Noted  . Weakness acquired in ICU 09/24/2019  . CVA (cerebral infarction) 11/03/2015    Past Surgical History:  Procedure Laterality Date  . BREAST CYST EXCISION    . cataracts     Bilateral  . HIP FRACTURE SURGERY Left 2013  . SHOULDER SURGERY       OB History   No obstetric history on file.     Family History  Problem Relation Age of Onset  . Hypertension Father   . Leukemia Father   . Thyroid cancer Mother     Social History   Tobacco Use  . Smoking status: Never Smoker  . Smokeless tobacco: Never Used  Substance Use Topics  .  Alcohol use: No    Alcohol/week: 0.0 standard drinks  . Drug use: No    Home Medications Prior to Admission medications   Medication Sig Start Date End Date Taking? Authorizing Provider  aspirin EC 81 MG tablet Take 325 mg by mouth daily.    [provider]  atorvastatin (LIPITOR) 40 MG tablet Take 1 tablet (40 mg total) by mouth daily at 6 PM. 11/06/15   Loletha Grayer, MD  furosemide (LASIX) 20 MG tablet Take 1 tablet (20 mg total) by mouth daily. Patient not taking: Reported on 10/02/2016 11/06/15   Loletha Grayer, MD  metoprolol (LOPRESSOR) 50 MG tablet Take 1 tablet (50 mg total) by mouth 2 (two) times daily. 11/06/15   Loletha Grayer, MD  Multiple Vitamin (MULTIVITAMIN WITH MINERALS) TABS tablet Take 1 tablet by mouth daily.    [provider]  spironolactone (ALDACTONE) 25 MG tablet Take 1 tablet by mouth daily. 09/17/16   [provider]  torsemide (DEMADEX) 20 MG tablet Take 1 tablet by mouth daily. 09/17/16   [provider]  vitamin B-12 (CYANOCOBALAMIN) 1000 MCG tablet Take 1,000 mcg by mouth daily.    [provider]    Allergies    Apixaban  Review of Systems   Review of Systems  Unable to perform ROS: Mental status change    Physical Exam  Updated Vital Signs  ED Triage Vitals [09/20/2019 1700]  Enc Vitals Group     BP      Pulse      Resp      Temp      Temp src      SpO2      Weight 100 lb 15.5 oz (45.8 kg)     Height      Head Circumference      Peak Flow      Pain Score      Pain Loc      Pain Edu?      Excl. in [place]?     Physical Exam Vitals and nursing note reviewed.  Constitutional:      General: She is not in acute distress.    Appearance: She is well-developed. She is ill-appearing.  HENT:     Head: Normocephalic and atraumatic.     Mouth/Throat:     Mouth: Mucous membranes are moist.  Eyes:     Conjunctiva/sclera: Conjunctivae normal.     Pupils: Pupils are equal, round, and reactive to light.    Cardiovascular:     Rate and Rhythm: Normal rate and regular rhythm.     Pulses: Normal pulses.     Heart sounds: Normal heart sounds. No murmur.  Pulmonary:     Effort: No respiratory distress.     Breath sounds: Normal breath sounds. No wheezing.     Comments: Diminished throughout Abdominal:     Palpations: Abdomen is soft.     Tenderness: There is no abdominal tenderness.  Musculoskeletal:     Cervical back: Neck supple.     Right lower leg: No edema.     Left lower leg: No edema.  Skin:    General: Skin is warm and dry.  Neurological:     Mental Status: She is alert.     Motor: Weakness present.     Comments: Patient with flaccid paralysis to the left, moves right-sided extremities, has right-sided gaze with left visual field deficit, slurred speech, left-sided facial weakness  Psychiatric:        Mood and Affect: Mood normal.     ED Results / Procedures / Treatments   Labs (all labs ordered are listed, but only abnormal results are displayed) Labs Reviewed  RESPIRATORY PANEL BY RT PCR (FLU A&B, COVID)  ETHANOL  PROTIME-INR  APTT  CBC  DIFFERENTIAL  COMPREHENSIVE METABOLIC PANEL  RAPID URINE DRUG SCREEN, HOSP PERFORMED  URINALYSIS, ROUTINE W REFLEX MICROSCOPIC  CBC  HEMOGLOBIN A1C  LIPID PANEL  I-STAT CHEM 8, ED    EKG EKG Interpretation  Date/Time:  Friday October 04 2019 18:08:57 EST Ventricular Rate:  58 PR Interval:    QRS Duration: 132 QT Interval:  450 QTC Calculation: 442 R Axis:   114 Text Interpretation: Atrial fibrillation Right bundle branch block Probable lateral infarct, age indeterminate Probable anteroseptal infarct, recent No significant change since last tracing Confirmed by Lennice Sites [phone]) on 10/10/2019 6:23:57 PM   Radiology CT Code Stroke CTA Head W/WO contrast  Result Date: 09/27/2019 CLINICAL DATA:  86 year old female with code stroke presentation and evidence of right MCA emergent large vessel occlusion on plain head  CT. EXAM: CT ANGIOGRAPHY HEAD AND NECK TECHNIQUE: Multidetector CT imaging of the head and neck was performed using the standard protocol during bolus administration of intravenous contrast. Multiplanar CT image reconstructions and MIPs were obtained to evaluate the vascular anatomy. Carotid stenosis measurements (when applicable) are obtained  utilizing NASCET criteria, using the distal internal carotid diameter as the denominator. CONTRAST:  70mL OMNIPAQUE IOHEXOL 350 MG/ML SOLN COMPARISON:  Plain head CT 1749 hours today. FINDINGS: CTA NECK Skeleton: Osteopenia. Advanced TMJ degeneration. Degenerative changes in the cervical spine, although mostly mild for age. Partially visible scoliosis in the upper thoracic spine. No acute osseous abnormality identified. Upper chest: Mild mosaic attenuation in the upper lungs. There are also 2 small areas of pulmonary ground-glass opacity in the right upper lobe, the larger on series 5, image 151. No pleural effusion. No superior mediastinal lymphadenopathy. Other neck: No acute neck soft tissue findings. Aortic arch: 3 vessel arch configuration. Mild for age arch atherosclerosis. Right carotid system: No brachiocephalic or right CCA origin plaque or stenosis. Mild calcified plaque at the right carotid bifurcation without stenosis. Left carotid system: Negative left CCA origin. Negative left carotid bifurcation. Mildly beaded appearance of the cervical left ICA in keeping with fibromuscular dysplasia (series 12, image 28). No stenosis. Vertebral arteries: No proximal right subclavian artery or right vertebral artery origin plaque or stenosis. The right vertebral is widely patent to the skull base without stenosis. Mild proximal left subclavian artery plaque without stenosis. Left vertebral artery origin is patent without stenosis. There is mild left V1 segment calcified plaque and tortuosity without stenosis. The left vertebral is patent to the skull base without stenosis. CTA  HEAD Posterior circulation: Mild left V4 segment calcified plaque, no stenosis. Normal PICA origins. No distal vertebral or vertebrobasilar junction stenosis. Patent basilar artery without stenosis. Normal SCA and PCA origins. Mildly ectatic basilar tip. Posterior communicating arteries are diminutive or absent. Bilateral PCA branches are within normal limits. Anterior circulation: Both ICA siphons are patent. On the left there is moderate cavernous and supraclinoid segment calcified plaque but mild if any stenosis. Similar moderate calcified plaque of the right ICA siphon with mild supraclinoid stenosis. Patent carotid termini. Patent MCA and ACA origins. Right ACA A1 is mildly dominant. Anterior communicating artery and bilateral ACA branches are within normal limits. There is left MCA M1 calcified plaque with moderate associated stenosis (series 9, image 119). But the left MCA remains patent with no stenosis at the bifurcation. Left MCA branches are within normal limits. The right MCA M1 occludes abruptly 5 mm beyond its origin (series 11, image 18). There is little collateral right MCA branch enhancement (series 10, image 15). Venous sinuses: Early contrast timing, not well evaluated. Anatomic variants: Mildly dominant right A1. Review of the MIP images confirms the above findings IMPRESSION: 1. Positive for Emergent Large Vessel Occlusion: Right MCA proximal M1. Little right MCA branch reconstitution. 2. Preliminary results of #1 were communicated to Dr. Leonel Ramsay at 6:07 pm on 10/02/2019 by text page via the [facility] messaging system. He called and we briefly discussed at that time also. 3. No significant extracranial atherosclerosis. There is calcified plaque of both ICA siphons (mild stenoses) and also the Left MCA M1 (moderate stenosis). 4. Two rounded areas of nonspecific ground-glass opacity in the right upper lobe. Initial follow-up by Chest CT without contrast is recommended in 3 months to confirm  persistence. This recommendation follows the consensus statement: Recommendations for the Management of Subsolid Pulmonary Nodules Detected at CT: A Statement from the [facility] as published in Radiology 2013; 266:304-317. Electronically Signed   By: Genevie Ann M.D.   On: 09/27/2019 18:18   CT Code Stroke CTA Neck W/WO contrast  Result Date: 09/28/2019 CLINICAL DATA:  86 year old female with code stroke presentation and  evidence of right MCA emergent large vessel occlusion on plain head CT. EXAM: CT ANGIOGRAPHY HEAD AND NECK TECHNIQUE: Multidetector CT imaging of the head and neck was performed using the standard protocol during bolus administration of intravenous contrast. Multiplanar CT image reconstructions and MIPs were obtained to evaluate the vascular anatomy. Carotid stenosis measurements (when applicable) are obtained utilizing NASCET criteria, using the distal internal carotid diameter as the denominator. CONTRAST:  8mL OMNIPAQUE IOHEXOL 350 MG/ML SOLN COMPARISON:  Plain head CT 1749 hours today. FINDINGS: CTA NECK Skeleton: Osteopenia. Advanced TMJ degeneration. Degenerative changes in the cervical spine, although mostly mild for age. Partially visible scoliosis in the upper thoracic spine. No acute osseous abnormality identified. Upper chest: Mild mosaic attenuation in the upper lungs. There are also 2 small areas of pulmonary ground-glass opacity in the right upper lobe, the larger on series 5, image 151. No pleural effusion. No superior mediastinal lymphadenopathy. Other neck: No acute neck soft tissue findings. Aortic arch: 3 vessel arch configuration. Mild for age arch atherosclerosis. Right carotid system: No brachiocephalic or right CCA origin plaque or stenosis. Mild calcified plaque at the right carotid bifurcation without stenosis. Left carotid system: Negative left CCA origin. Negative left carotid bifurcation. Mildly beaded appearance of the cervical left ICA in keeping with  fibromuscular dysplasia (series 12, image 28). No stenosis. Vertebral arteries: No proximal right subclavian artery or right vertebral artery origin plaque or stenosis. The right vertebral is widely patent to the skull base without stenosis. Mild proximal left subclavian artery plaque without stenosis. Left vertebral artery origin is patent without stenosis. There is mild left V1 segment calcified plaque and tortuosity without stenosis. The left vertebral is patent to the skull base without stenosis. CTA HEAD Posterior circulation: Mild left V4 segment calcified plaque, no stenosis. Normal PICA origins. No distal vertebral or vertebrobasilar junction stenosis. Patent basilar artery without stenosis. Normal SCA and PCA origins. Mildly ectatic basilar tip. Posterior communicating arteries are diminutive or absent. Bilateral PCA branches are within normal limits. Anterior circulation: Both ICA siphons are patent. On the left there is moderate cavernous and supraclinoid segment calcified plaque but mild if any stenosis. Similar moderate calcified plaque of the right ICA siphon with mild supraclinoid stenosis. Patent carotid termini. Patent MCA and ACA origins. Right ACA A1 is mildly dominant. Anterior communicating artery and bilateral ACA branches are within normal limits. There is left MCA M1 calcified plaque with moderate associated stenosis (series 9, image 119). But the left MCA remains patent with no stenosis at the bifurcation. Left MCA branches are within normal limits. The right MCA M1 occludes abruptly 5 mm beyond its origin (series 11, image 18). There is little collateral right MCA branch enhancement (series 10, image 15). Venous sinuses: Early contrast timing, not well evaluated. Anatomic variants: Mildly dominant right A1. Review of the MIP images confirms the above findings IMPRESSION: 1. Positive for Emergent Large Vessel Occlusion: Right MCA proximal M1. Little right MCA branch reconstitution. 2.  Preliminary results of #1 were communicated to Dr. Leonel Ramsay at 6:07 pm on 09/16/2019 by text page via the [facility] messaging system. He called and we briefly discussed at that time also. 3. No significant extracranial atherosclerosis. There is calcified plaque of both ICA siphons (mild stenoses) and also the Left MCA M1 (moderate stenosis). 4. Two rounded areas of nonspecific ground-glass opacity in the right upper lobe. Initial follow-up by Chest CT without contrast is recommended in 3 months to confirm persistence. This recommendation follows the consensus statement: Recommendations  for the Management of Subsolid Pulmonary Nodules Detected at CT: A Statement from the [place] as published in Radiology 2013; 266:304-317. Electronically Signed   By: Genevie Ann M.D.   On: 09/17/2019 18:18   CT HEAD CODE STROKE WO CONTRAST  Result Date: 09/24/2019 CLINICAL DATA:  Code stroke. 86 year old female with left side weakness and rightward gaze. EXAM: CT HEAD WITHOUT CONTRAST TECHNIQUE: Contiguous axial images were obtained from the base of the skull through the vertex without intravenous contrast. COMPARISON:  Brain MRI 09/30/2016.  Head CT 08/23/2019. FINDINGS: Brain: No intracranial mass effect or midline shift. Stable ventricle size and configuration. No right MCA territory cytotoxic edema identified. Gray-white matter differentiation appears to be stable since January in both hemispheres. Chronic right cerebellar infarct. Small area of chronic right occipital pole encephalomalacia. Vascular: Calcified atherosclerosis at the skull base. Chronic MCA calcified atherosclerosis also noted, but hyperdense right MCA M1 and bifurcation is new. Skull: Osteopenia.  No acute osseous abnormality identified. Sinuses/Orbits: Visualized paranasal sinuses and mastoids are stable and well pneumatized. Other: Rightward gaze. No other acute orbit or scalp soft tissue finding. ASPECTS [facility] Stroke Program Early CT Score) Total  score (0-10 with 10 being normal): 10 IMPRESSION: 1. Hyperdense Right MCA and rightward gaze highly suggestive of ELVO, but no acute infarct changes identified in the right MCA territory. ASPECTS 10. 2. No intracranial hemorrhage identified. Small chronic infarct in the right cerebellum. 3. These results were communicated to Dr. Leonel Ramsay at 5:57 pm on 09/18/2019 by text page via the [facility] messaging system. Electronically Signed   By: Genevie Ann M.D.   On: 09/28/2019 17:59    Procedures .Critical Care Performed by: Lennice Sites, DO Authorized by: Lennice Sites, DO   Critical care provider statement:    Critical care time (minutes):  76   Critical care was necessary to treat or prevent imminent or life-threatening deterioration of the following conditions:  Respiratory failure and CNS failure or compromise   Critical care was time spent personally by me on the following activities:  Blood draw for specimens, development of treatment plan with patient or surrogate, discussions with primary provider, evaluation of patient's response to treatment, examination of patient, obtaining history from patient or surrogate, ordering and performing treatments and interventions, ordering and review of laboratory studies, ordering and review of radiographic studies, pulse oximetry, re-evaluation of patient's condition and review of old charts   I assumed direction of critical care for this patient from another provider in my specialty: no     (including critical care time)  Medications Ordered in ED Medications  alteplase (ACTIVASE) 1 mg/mL infusion 41.2 mg (41.2 mg Intravenous New Bag/Given 10/13/2019 1755)    Followed by  0.9 %  sodium chloride infusion (has no administration in time range)   stroke: mapping our early stages of recovery book (has no administration in time range)  0.9 %  sodium chloride infusion (has no administration in time range)  acetaminophen (TYLENOL) tablet 650 mg (has no administration  in time range)    Or  acetaminophen (TYLENOL) 160 MG/5ML solution 650 mg (has no administration in time range)    Or  acetaminophen (TYLENOL) suppository 650 mg (has no administration in time range)  labetalol (NORMODYNE) injection 10 mg (has no administration in time range)    And  nicardipine (CARDENE) 20mg  in 0.86% saline 241ml IV infusion (0.1 mg/ml) (has no administration in time range)  pantoprazole (PROTONIX) injection 40 mg (has no administration in time range)  iohexol (OMNIPAQUE) 350 MG/ML injection 80 mL (80 mLs Intravenous Contrast Given 10/10/2019 1804)    ED Course  I have reviewed the triage vital signs and the nursing notes.  Pertinent labs & imaging results that were available during my care of the patient were reviewed by me and considered in my medical decision making (see chart for details).    MDM Rules/Calculators/A&P                      Cristina [name] is a 86 year old female with history of atrial fibrillation not on anticoagulation, heart failure who presents to the ED as a code stroke.  Patient with unremarkable vitals.  No fever. patient last seen normal at 1635 when she was with family.  She stood up and had slurred speech, left-sided weakness.  Patient arrives with right-sided gaze preference, left visual field deficit, left-sided weakness compared to the right.  She went directly to CT with neurology.  Head CT showed no acute bleeding.  Neurology discussed with family and TPA was given.  She appears to have a hyperdense right MCA suggestive of a large vessel occlusion however family did not want any further intervention other than TPA and therefore patient will not go for IR intervention.  Screening labs have been ordered.  When patient arrived to her room and was placed on the monitor she did have hypoxia down to the 80s that improved on nonrebreather.  Patient appeared in no obvious respiratory distress.  No obvious signs of volume overload.  Patient is a DNR and  neurology is aware of this oxygen need.  We will continue to monitor for goals of care discussion with family.  We will add a chest x-ray. Neuro to follow up remaining labs and imaging.   Patient to be admitted to the neurological ICU for further stroke care.  Patient at her baseline is able to walk around with a walker does have some dementia.   This chart was dictated using voice recognition software.  Despite best efforts to proofread,  errors can occur which can change the documentation meaning.    Final Clinical Impression(s) / ED Diagnoses Final diagnoses:  Acute CVA (cerebrovascular accident) ([place])  Acute respiratory failure with hypoxia [facility])    Rx / [facility] Orders ED Discharge Orders    None       Lennice Sites, DO 10/09/2019 1827

## 2019-10-04 NOTE — Progress Notes (Signed)
PHARMACIST CODE STROKE RESPONSE  Notified to mix tPA at 1748 by Dr. Leonel Ramsay Delivered tPA to RN at 1752  tPA dose = 4.1mg  bolus over 1 minute followed by 37.1mg  for a total dose of 41.2mg  over 1 hour  Bertis Ruddy 10/10/2019 5:57 PM

## 2019-10-05 ENCOUNTER — Inpatient Hospital Stay (HOSPITAL_COMMUNITY): Payer: Medicare Other

## 2019-10-05 DIAGNOSIS — N184 Chronic kidney disease, stage 4 (severe): Secondary | ICD-10-CM

## 2019-10-05 DIAGNOSIS — I340 Nonrheumatic mitral (valve) insufficiency: Secondary | ICD-10-CM

## 2019-10-05 DIAGNOSIS — L8990 Pressure ulcer of unspecified site, unspecified stage: Secondary | ICD-10-CM | POA: Diagnosis present

## 2019-10-05 DIAGNOSIS — I361 Nonrheumatic tricuspid (valve) insufficiency: Secondary | ICD-10-CM

## 2019-10-05 DIAGNOSIS — I63411 Cerebral infarction due to embolism of right middle cerebral artery: Principal | ICD-10-CM

## 2019-10-05 DIAGNOSIS — I4821 Permanent atrial fibrillation: Secondary | ICD-10-CM

## 2019-10-05 DIAGNOSIS — I6330 Cerebral infarction due to thrombosis of unspecified cerebral artery: Secondary | ICD-10-CM | POA: Insufficient documentation

## 2019-10-05 DIAGNOSIS — I6340 Cerebral infarction due to embolism of unspecified cerebral artery: Secondary | ICD-10-CM | POA: Diagnosis present

## 2019-10-05 DIAGNOSIS — I9589 Other hypotension: Secondary | ICD-10-CM

## 2019-10-05 LAB — LIPID PANEL
Cholesterol: 92 mg/dL (ref 0–200)
HDL: 44 mg/dL (ref >40)
LDL Cholesterol: 36 mg/dL (ref 0–99)
Total CHOL/HDL Ratio: 2.1 RATIO
Triglycerides: 61 mg/dL (ref <150)
VLDL: 12 mg/dL (ref 0–40)

## 2019-10-05 LAB — GLUCOSE, CAPILLARY
Glucose-Capillary: 102 mg/dL — ABNORMAL HIGH (ref 70–99)
Glucose-Capillary: 118 mg/dL — ABNORMAL HIGH (ref 70–99)

## 2019-10-05 LAB — HEMOGLOBIN A1C
Hgb A1c MFr Bld: 6 % — ABNORMAL HIGH (ref 4.8–5.6)
Mean Plasma Glucose: 125.5 mg/dL

## 2019-10-05 LAB — ECHOCARDIOGRAM COMPLETE: Weight: 1548.51 oz

## 2019-10-05 LAB — BASIC METABOLIC PANEL
Anion gap: 15 (ref 5–15)
BUN: 44 mg/dL — ABNORMAL HIGH (ref 8–23)
CO2: 23 mmol/L (ref 22–32)
Calcium: 8.8 mg/dL — ABNORMAL LOW (ref 8.9–10.3)
Chloride: 99 mmol/L (ref 98–111)
Creatinine, Ser: 1.71 mg/dL — ABNORMAL HIGH (ref 0.44–1.00)
GFR calc Af Amer: 29 mL/min — ABNORMAL LOW (ref >60)
GFR calc non Af Amer: 25 mL/min — ABNORMAL LOW (ref >60)
Glucose, Bld: 110 mg/dL — ABNORMAL HIGH (ref 70–99)
Potassium: 3.7 mmol/L (ref 3.5–5.1)
Sodium: 137 mmol/L (ref 135–145)

## 2019-10-05 LAB — RAPID URINE DRUG SCREEN, HOSP PERFORMED
Amphetamines: NOT DETECTED
Barbiturates: NOT DETECTED
Benzodiazepines: NOT DETECTED
Cocaine: NOT DETECTED
Opiates: NOT DETECTED
Tetrahydrocannabinol: NOT DETECTED

## 2019-10-05 LAB — URINALYSIS, ROUTINE W REFLEX MICROSCOPIC
Bilirubin Urine: NEGATIVE
Glucose, UA: NEGATIVE mg/dL
Hgb urine dipstick: NEGATIVE
Ketones, ur: NEGATIVE mg/dL
Leukocytes,Ua: NEGATIVE
Nitrite: NEGATIVE
Protein, ur: NEGATIVE mg/dL
Specific Gravity, Urine: 1.032 — ABNORMAL HIGH (ref 1.005–1.030)
pH: 7 (ref 5.0–8.0)

## 2019-10-05 LAB — CBC
HCT: 42.2 % (ref 36.0–46.0)
Hemoglobin: 14.4 g/dL (ref 12.0–15.0)
MCH: 32.4 pg (ref 26.0–34.0)
MCHC: 34.1 g/dL (ref 30.0–36.0)
MCV: 95 fL (ref 80.0–100.0)
Platelets: 172 10*3/uL (ref 150–400)
RBC: 4.44 MIL/uL (ref 3.87–5.11)
RDW: 14.5 % (ref 11.5–15.5)
WBC: 8.8 10*3/uL (ref 4.0–10.5)
nRBC: 0 % (ref 0.0–0.2)

## 2019-10-05 MED ORDER — METOPROLOL TARTRATE 12.5 MG HALF TABLET
12.5000 mg | ORAL_TABLET | Freq: Two times a day (BID) | ORAL | Status: DC
  Administered 2019-10-05 – 2019-10-06 (×2): 12.5 mg via ORAL
  Filled 2019-10-05 (×2): qty 1

## 2019-10-05 MED ORDER — CHLORHEXIDINE GLUCONATE CLOTH 2 % EX PADS
6.0000 | MEDICATED_PAD | Freq: Every day | CUTANEOUS | Status: DC
  Administered 2019-10-05 – 2019-10-06 (×2): 6 via TOPICAL

## 2019-10-05 MED ORDER — VITAMIN B-12 1000 MCG PO TABS
1000.0000 ug | ORAL_TABLET | Freq: Every day | ORAL | Status: DC
  Administered 2019-10-06: 10:00:00 1000 ug via ORAL
  Filled 2019-10-05 (×3): qty 1

## 2019-10-05 MED ORDER — PANCRELIPASE (LIP-PROT-AMYL) 10440-39150 UNITS PO TABS
20880.0000 [IU] | ORAL_TABLET | Freq: Once | ORAL | Status: DC
  Filled 2019-10-05: qty 2

## 2019-10-05 MED ORDER — SODIUM CHLORIDE 0.9 % IV BOLUS
1000.0000 mL | Freq: Once | INTRAVENOUS | Status: AC
  Administered 2019-10-05: 1000 mL via INTRAVENOUS

## 2019-10-05 MED ORDER — ADULT MULTIVITAMIN W/MINERALS CH
1.0000 | ORAL_TABLET | Freq: Every day | ORAL | Status: DC
  Administered 2019-10-06: 10:00:00 1 via ORAL
  Filled 2019-10-05 (×2): qty 1

## 2019-10-05 MED ORDER — ORAL CARE MOUTH RINSE
15.0000 mL | Freq: Two times a day (BID) | OROMUCOSAL | Status: DC
  Administered 2019-10-05 – 2019-10-07 (×6): 15 mL via OROMUCOSAL

## 2019-10-05 MED ORDER — PRO-STAT SUGAR FREE PO LIQD
30.0000 mL | Freq: Two times a day (BID) | ORAL | Status: DC
  Administered 2019-10-05 – 2019-10-07 (×5): 30 mL
  Filled 2019-10-05 (×5): qty 30

## 2019-10-05 MED ORDER — SODIUM BICARBONATE 650 MG PO TABS
650.0000 mg | ORAL_TABLET | Freq: Once | ORAL | Status: DC
  Filled 2019-10-05: qty 1

## 2019-10-05 MED ORDER — VITAL HIGH PROTEIN PO LIQD
1000.0000 mL | ORAL | Status: DC
  Administered 2019-10-05 – 2019-10-07 (×3): 1000 mL
  Filled 2019-10-05: qty 1000

## 2019-10-05 MED ORDER — ATORVASTATIN CALCIUM 40 MG PO TABS
20.0000 mg | ORAL_TABLET | Freq: Every day | ORAL | Status: DC
  Administered 2019-10-06: 18:00:00 20 mg via ORAL
  Filled 2019-10-05: qty 1

## 2019-10-05 NOTE — Evaluation (Signed)
Clinical/Bedside Swallow Evaluation Patient Details  Name: Cristina [name] MRN: [id number] Date of Birth: 12-08-1923  Today's Date: 10/05/2019 Time: SLP Start Time (ACUTE ONLY): 1250 SLP Stop Time (ACUTE ONLY): 1305 SLP Time Calculation (min) (ACUTE ONLY): 15 min  Past Medical History:  Past Medical History:  Diagnosis Date  . Atrial fibrillation ([place])   . Atrial septal defect   . Congestive heart failure (HCC)    Diastolic dysfunction  . Diverticulosis   . DJD (degenerative joint disease)   . Neuropathy   . Osteoporosis   . Punctate hemorrhage of left frontal lobe [facility])    after head trauma in Nov 2016  . Severe mitral regurgitation    Past Surgical History:  Past Surgical History:  Procedure Laterality Date  . BREAST CYST EXCISION    . cataracts     Bilateral  . HIP FRACTURE SURGERY Left 2013  . SHOULDER SURGERY     HPI:  86 y.o. female with PMH of Afib (not on [place] d/t freq falls and age), septal defect, severe mitral regurgitation, CHF, DJD, neuropathy, TIA, dementia, who resides at home with family and uses a wheelchair and needs some assistance. Family called EMS when pt developed left side weakness and slurred speech. CTA showed R MCA M1 occlusion.  Family consented to IV tPA, no intubation or thrombectomy.    Assessment / Plan / Recommendation Clinical Impression  Pt participated in limited swallowing assessment; required max prompting to maintain arousal.  Daughter present for exam. Pt repositioned for optimal performance; she accepted ice chips, teaspoons of water with adequate lip seal and oral attention.  Swallow response was palpable, but pt executed multiple subswallows with each single bolus, suggesting weakness.  She coughed after all trials; oral suctioning assisted to remove what sounded like standing secretions.  Pt is likely aspirating.  Recommend NPO for now.  D/W her daughter the impact of CVA on swallowing; SLP will follow to determine readiness for oral  diet.  Plan is to place temporary NG to allow some time for recovery.  D/W RN. SLP Visit Diagnosis: Dysphagia, oropharyngeal phase (R13.12)    Aspiration Risk       Diet Recommendation   NPO       Other  Recommendations Oral Care Recommendations: Oral care QID   Follow up Recommendations Other (comment)(tba)      Frequency and Duration min 3x week  2 weeks       Prognosis Prognosis for Safe Diet Advancement: Guarded      Swallow Study   General Date of Onset: 10/05/2019 HPI: 86 y.o. female with PMH of Afib (not on [place] d/t freq falls and age), septal defect, severe mitral regurgitation, CHF, DJD, neuropathy, TIA, dementia, who resides at home with family and uses a wheelchair and needs some assistance. Family called EMS when pt developed left side weakness and slurred speech. CTA showed R MCA M1 occlusion.  Family consented to IV tPA, no intubation or thrombectomy.  Type of Study: Bedside Swallow Evaluation Previous Swallow Assessment: 2017 at [place] Prior to this Study: NPO Temperature Spikes Noted: No Respiratory Status: Nasal cannula History of Recent Intubation: No Behavior/Cognition: Lethargic/Drowsy Oral Cavity Assessment: Within Functional Limits Oral Care Completed by SLP: Recent completion by staff Self-Feeding Abilities: Total assist Patient Positioning: Upright in bed Baseline Vocal Quality: Low vocal intensity Volitional Cough: Weak Volitional Swallow: Unable to elicit    Oral/Motor/Sensory Function Overall Oral Motor/Sensory Function: Generalized oral weakness   Ice Chips Ice chips: Impaired  Presentation: Spoon Pharyngeal Phase Impairments: Multiple swallows;Cough - Immediate   Thin Liquid Thin Liquid: Impaired Presentation: Spoon Pharyngeal  Phase Impairments: Multiple swallows;Cough - Immediate    Nectar Thick Nectar Thick Liquid: Not tested   Honey Thick Honey Thick Liquid: Not tested   Puree Puree: Not tested   Solid     Solid: Not tested       Juan Quam Laurice 10/05/2019,1:23 PM Estill Bamberg L. Tivis Ringer, [place] Office number [phone] Pager [phone]

## 2019-10-05 NOTE — Progress Notes (Signed)
  Echocardiogram 2D Echocardiogram has been performed.  Cristina [name] 10/05/2019, 2:41 PM

## 2019-10-05 NOTE — Evaluation (Signed)
Physical Therapy Evaluation Patient Details Name: Cristina [name] MRN: [id number] DOB: December 10, 1923 Today's Date: 10/05/2019   History of Present Illness  86 y.o. female with PMH of Afib (not on [place] d/t freq falls and age), septal defect, severe mitral regurgitation, CHF, DJD, neuropathy, TIA, dementia, who resides at home with her husband (4 kids local help out). Pt developed left side weakness and slurred speech 09/17/2019. CTA showed R MCA M1 occlusion.  Family consented to IV tPA.   Clinical Impression   Pt admitted with above diagnosis. Patient unable to open eyes, however gave single word responses and head nods throughout session. Followed simple commands with rt extremities. Lt extremities are flaccid. Patient would turn her head to her left past midline when asked (still with eyes closed--?eye opening apraxia?). Prior to CVA, she was walking by holding onto furniture (despite family's best attempts to get her to use a rollator). She had several falls. She and her husband have been married 80 years (per daughter) and want to remain at home together. Pt currently with functional limitations due to the deficits listed below (see PT Problem List). Pt will benefit from skilled PT to increase their independence and safety with mobility to allow discharge to the venue listed below.       Follow Up Recommendations Other (comment)(TBA as progresses medically; family wants home if possible)    Equipment Recommendations  Other (comment)(TBA as progresses)    Recommendations for Other Services OT consult     Precautions / Restrictions Precautions Precautions: Fall Precaution Comments: dtr reports most recent fall in January with trip to ED (hit her head)      Mobility  Bed Mobility               General bed mobility comments: unable to assess; IV constantly beeping and RN in to try to fix it and then brought a second Therapist, sports  Transfers                    Ambulation/Gait                 Stairs            Wheelchair Mobility    Modified Rankin (Stroke Patients Only)       Balance                                             Pertinent Vitals/Pain Pain Assessment: Faces Faces Pain Scale: No hurt    Home Living Family/patient expects to be discharged to:: Private residence Living Arrangements: Spouse/significant other Available Help at Discharge: Family;Available 24 hours/day(husband 24/7; daughter and/or son come by each day) Type of Home: House Home Access: Stairs to enter Entrance Stairs-Rails: Right;Left;Can reach both Entrance Stairs-Number of Steps: 5 Home Layout: One level Home Equipment: Walker - 2 wheels;Walker - 4 wheels;Walker - standard;Bedside commode(2 over toilets; one at bedside she won't use)      Prior Function Level of Independence: Needs assistance   Gait / Transfers Assistance Needed: furniture walker; family has tried to get her to use her rollator, but she uses poorly  ADL's / Homemaking Assistance Needed: in shower only 1x/week; dtr goes with her (pt holds grab bar and dtr washes her down); otherwise sink baths (pt does her face and "private parts" and dtr does the rest; able to self feed  Hand Dominance   Dominant Hand: Right    Extremity/Trunk Assessment   Upper Extremity Assessment Upper Extremity Assessment: Defer to OT evaluation(PROM LUE with no tone noted)    Lower Extremity Assessment Lower Extremity Assessment: LLE deficits/detail;RLE deficits/detail RLE Deficits / Details: AROM 2+ grossly; AAROM WFL except resists hip flexion >40 degrees; no grimacing LLE Deficits / Details: no AROM noted; PROM WFL       Communication   Communication: No difficulties  Cognition Arousal/Alertness: Lethargic(arouses, but not opening eyes)   Overall Cognitive Status: Difficult to assess                                 General Comments: followed commands with rt  extremities; attempts to open eyes, but lids do not raise      General Comments General comments (skin integrity, edema, etc.): Multiple bruises/hematomas on extremities--per daughter she bumps into things all the time.     Exercises Other Exercises Other Exercises: 3-5 reps PROM all planes in bil LEs and LUE (Nursing working on IV in [place])   Assessment/Plan    PT Assessment Patient needs continued PT services  PT Problem List Decreased strength;Decreased range of motion;Decreased activity tolerance;Decreased balance;Decreased mobility;Decreased cognition;Decreased knowledge of use of DME;Decreased safety awareness       PT Treatment Interventions DME instruction;Gait training;Functional mobility training;Therapeutic activities;Balance training;Neuromuscular re-education;Cognitive remediation;Patient/family education;Wheelchair mobility training    PT Goals (Current goals can be found in the Care Plan section)  Acute Rehab PT Goals Patient Stated Goal: unable to state PT Goal Formulation: With family Time For Goal Achievement: 10/19/19 Potential to Achieve Goals: Fair    Frequency Min 4X/week   Barriers to discharge        Co-evaluation               AM-PAC PT "6 Clicks" Mobility  Outcome Measure Help needed turning from your back to your side while in a flat bed without using bedrails?: Total Help needed moving from lying on your back to sitting on the side of a flat bed without using bedrails?: Total Help needed moving to and from a bed to a chair (including a wheelchair)?: Total Help needed standing up from a chair using your arms (e.g., wheelchair or bedside chair)?: Total Help needed to walk in hospital room?: Total Help needed climbing 3-5 steps with a railing? : Total 6 Click Score: 6    End of Session Equipment Utilized During Treatment: Oxygen Activity Tolerance: Patient limited by lethargy Patient left: in bed;with nursing/sitter in room;with  family/visitor present Nurse Communication: Other (comment)(following commands with Rt extremities) PT Visit Diagnosis: Hemiplegia and hemiparesis Hemiplegia - Right/Left: Left Hemiplegia - dominant/non-dominant: Non-dominant Hemiplegia - caused by: Cerebral infarction    Time: [id number] PT Time Calculation (min) (ACUTE ONLY): 27 min   Charges:   PT Evaluation $PT Eval Moderate Complexity: 1 Mod PT Treatments $Therapeutic Exercise: 8-22 mins         Arby Barrette, PT Pager [phone]   Rexanne Mano 10/05/2019, 4:10 PM

## 2019-10-05 NOTE — Progress Notes (Signed)
STROKE TEAM PROGRESS NOTE   INTERVAL HISTORY Her daughter is at the bedside.  Patient is lethargic, hardly open eyes, however able to follow simple commands on the right.  Severe dysarthria, difficulty answer orientation question due to lethargy.  MRI done overnight showed right BG/CR moderate size infarct.  CT head pending at 24 hours TPA.  Not able to pass swallow, will put on NG tube for tube feeding.   OBJECTIVE Vitals:   10/05/19 0100 10/05/19 0200 10/05/19 0300 10/05/19 0400  BP: (!) 90/48 (!) 100/55 (!) 84/50   Pulse: (!) 108 (!) 109 98   Resp: (!) 25 (!) 24 (!) 26   Temp:    98.6 F (37 C)  TempSrc:    Axillary  SpO2: 93% 92% 91%   Weight:        CBC:  Recent Labs  Lab 10/10/2019 1835 10/12/2019 1835 10/05/2019 1843 10/05/19 0252  WBC 4.4  --   --  8.8  NEUTROABS 3.3  --   --   --   HGB 14.8   < > 15.0 14.4  HCT 46.1*   < > 44.0 42.2  MCV 98.1  --   --  95.0  PLT 161  --   --  172   < > = values in this interval not displayed.    Basic Metabolic Panel:  Recent Labs  Lab 10/06/2019 1835 10/02/2019 1835 10/03/2019 1843 10/05/19 0252  NA 133*   < > 135 137  K 4.7   < > 4.7 3.7  CL 92*   < > 97* 99  CO2 29  --   --  23  GLUCOSE 106*   < > 101* 110*  BUN 45*   < > 44* 44*  CREATININE 1.70*   < > 1.80* 1.71*  CALCIUM 9.2  --   --  8.8*   < > = values in this interval not displayed.    Lipid Panel:     Component Value Date/Time   CHOL 92 10/05/2019 0252   TRIG 61 10/05/2019 0252   HDL 44 10/05/2019 0252   CHOLHDL 2.1 10/05/2019 0252   VLDL 12 10/05/2019 0252   LDLCALC 36 10/05/2019 0252   HgbA1c:  Lab Results  Component Value Date   HGBA1C 6.0 (H) 10/05/2019   Urine Drug Screen: No results found for: LABOPIA, COCAINSCRNUR, LABBENZ, AMPHETMU, THCU, LABBARB  Alcohol Level     Component Value Date/Time   ETH <10 09/27/2019 [place]    IMAGING  CT Code Stroke CTA Head W/WO contrast CT Code Stroke CTA Neck W/WO contrast 09/26/2019 IMPRESSION:  1. Positive  for Emergent Large Vessel Occlusion: Right MCA proximal M1. Little right MCA branch reconstitution.  2. Preliminary results of #1 were communicated to Dr. Leonel Ramsay at 6:07 pm on 09/28/2019 by text page via the [facility] messaging system. He called and we briefly discussed at that time also.  3. No significant extracranial atherosclerosis. There is calcified plaque of both ICA siphons (mild stenoses) and also the Left MCA M1 (moderate stenosis).  4. Two rounded areas of nonspecific ground-glass opacity in the right upper lobe. Initial follow-up by Chest CT without contrast is recommended in 3 months to confirm persistence. This recommendation follows the consensus statement: Recommendations for the Management of Subsolid Pulmonary Nodules Detected at CT: A Statement from the [place] as published in Radiology 2013; 266:304-317.   MR BRAIN WO CONTRAST 10/05/2019 IMPRESSION:  Acute infarction affecting the right basal ganglia and portions of  the corona radiata. No mass effect or hemorrhage. Minimal small area of acute cortical restricted diffusion at the right temporoparietal junction. Old small cortical infarctions elsewhere affecting the cerebellum, both occipital regions and both parietal regions.   CT HEAD CODE STROKE WO CONTRAST 09/21/2019 IMPRESSION:  1. Hyperdense Right MCA and rightward gaze highly suggestive of ELVO, but no acute infarct changes identified in the right MCA territory. ASPECTS 10.  2. No intracranial hemorrhage identified. Small chronic infarct in the right cerebellum.   CT Head WO Contrast - pending 10/05/19  DG Chest Portable 1 View  10/02/2019 IMPRESSION:  Severe cardiomegaly appears stable along with chronic hypoventilation at the left lung base. No new cardiopulmonary abnormality.   Transthoracic Echocardiogram  00/00/2021 Pending   ECG - atrial fibrillation - ventricular response 58 BPM. Probable recent anteroseptal infarct. (See cardiology reading for  complete details)   PHYSICAL EXAM Blood pressure (!) 84/50, pulse 98, temperature 98.6 F (37 C), temperature source Axillary, resp. rate (!) 26, weight 43.9 kg, SpO2 91 %.  General - Well nourished, well developed, lethargic.  Ophthalmologic - fundi not visualized due to noncooperation.  Cardiovascular - irregularly irregular heart rate and rhythm.  Neuro - very lethargic, eyes barely open with voice but able to answer simple questions with dysarthric voice, she denies any pain. She is orientated to self but did not answer other orientated questions due to lethargy. Severe dysarthria but following all simple commands, able to repeat simple 3-word sentences but not 5-word sentence, did not name. PERRL, right gaze preference, not able to cross midline. Blinking to visual threat inconsistently with eyes open. Left facial droop, tongue midline. RUE 3-/5 proximal and 4/5 distally. RLE 3-/5 proximal and 3/5 distally. However, LUE flaccid, LLE slight withdraw to pain. No babinski. Sensation, coordination and gait not tested.   HOME MEDICATIONS:   Ms. Cristina [name] is a 86 y.o. female with history of  Afib (not on [place] d/t freq falls and age), septal defect, severe mitral regurgitation, hx of head trauma 2016 with punctate hemorrhage left frontal lobe, CHF, DJD, neuropathy, TIA, dementia presenting with left sided weakness, slurred speech and right gaze. The patient received IV t-PA Friday 10/02/2019 at 17:55. Thrombectomy was discussed with the family however they declined intervention.  Stroke: Acute right MCA infarct - embolic - atrial fibrillation not on AC  CT Head -  Hyperdense Right MCA and rightward gaze highly suggestive of ELVO, but no acute infarct changes identified in the right MCA territory. ASPECTS 10.   CTA H&N - Right MCA proximal M1 ELVO. Little right MCA branch reconstitution. Calcified plaque of both ICA siphons (mild stenoses) and also the Left MCA M1 (moderate stenosis).    MRI head - Acute infarction affecting the right basal ganglia and portions of the corona radiata. Old small cortical infarctions elsewhere affecting the cerebellum, both occipital regions and both parietal regions.   CT head - pending  2D Echo - pending  LDL - 36  HgbA1c - 6.0  UDS - negative  VTE prophylaxis - SCDs  aspirin 81 mg daily prior to admission, now on No antithrombotic within 24 hours s/p tPA  Patient will counseled to be compliant with her antithrombotic medications  Ongoing aggressive stroke risk factor management  Therapy recommendations:  pending  Disposition:  Pending  Chronic A. fib   Not on AC due to fall risk  Also allergic to Elliquis -> rash  EKG showed A. Fib  Intermittent RVR  Resume half dose  of home metoprolol - 12.5 twice daily  Hypotension  BP lower side . As per daughter, patient baseline BP at home was low . Close BP monitoring . On half dose of home metoprolol  Hyperlipidemia  Home Lipid lowering medication: Lipitor 20 mg daily  LDL 36, goal < 70  Resume Lipitor 20  Continue statin at discharge  Dysphagia  Not able to pass swallow  Speech on board  On tube feeding  Other Stroke Risk Factors  Advanced age  Hx stroke - by imaging  Congestive Heart Failure - home meds metoprolol, Demadex, aldactone, Lasix  Other Active Problems  Code status - DNR/DNI   CKD stage 4 - Creatinine - 170->1.80->1.71   Hospital day # 1  This patient is critically ill due to acute stroke, right MCA occlusion, A. fib with RVR, CHF, hypotension and at significant risk of neurological worsening, death form recurrent stroke, hemorrhagic conversion, heart failure, shock and seizure. This patient's care requires constant monitoring of vital signs, hemodynamics, respiratory and cardiac monitoring, review of multiple databases, neurological assessment, discussion with family, other specialists and medical decision making of high  complexity. I spent 35 minutes of neurocritical care time in the care of this patient. I had long discussion with daughter at bedside, updated pt current condition, treatment plan and potential prognosis, and answered all the questions.  Daughter expressed understanding and appreciation.    Rosalin Hawking, MD PhD Stroke Neurology 10/05/2019 6:57 PM   To contact Stroke Continuity provider, please refer to [email or web address] After hours, contact General Neurology

## 2019-10-06 ENCOUNTER — Inpatient Hospital Stay (HOSPITAL_COMMUNITY): Payer: Medicare Other

## 2019-10-06 DIAGNOSIS — I4891 Unspecified atrial fibrillation: Secondary | ICD-10-CM

## 2019-10-06 DIAGNOSIS — I5033 Acute on chronic diastolic (congestive) heart failure: Secondary | ICD-10-CM

## 2019-10-06 DIAGNOSIS — J9601 Acute respiratory failure with hypoxia: Secondary | ICD-10-CM

## 2019-10-06 LAB — CBC
HCT: 39.1 % (ref 36.0–46.0)
Hemoglobin: 12.8 g/dL (ref 12.0–15.0)
MCH: 31.4 pg (ref 26.0–34.0)
MCHC: 32.7 g/dL (ref 30.0–36.0)
MCV: 95.8 fL (ref 80.0–100.0)
Platelets: 151 10*3/uL (ref 150–400)
RBC: 4.08 MIL/uL (ref 3.87–5.11)
RDW: 14.9 % (ref 11.5–15.5)
WBC: 10 10*3/uL (ref 4.0–10.5)
nRBC: 0 % (ref 0.0–0.2)

## 2019-10-06 LAB — BASIC METABOLIC PANEL
Anion gap: 13 (ref 5–15)
BUN: 59 mg/dL — ABNORMAL HIGH (ref 8–23)
CO2: 21 mmol/L — ABNORMAL LOW (ref 22–32)
Calcium: 8.9 mg/dL (ref 8.9–10.3)
Chloride: 105 mmol/L (ref 98–111)
Creatinine, Ser: 2.09 mg/dL — ABNORMAL HIGH (ref 0.44–1.00)
GFR calc Af Amer: 23 mL/min — ABNORMAL LOW (ref >60)
GFR calc non Af Amer: 20 mL/min — ABNORMAL LOW (ref >60)
Glucose, Bld: 171 mg/dL — ABNORMAL HIGH (ref 70–99)
Potassium: 4.4 mmol/L (ref 3.5–5.1)
Sodium: 139 mmol/L (ref 135–145)

## 2019-10-06 LAB — GLUCOSE, CAPILLARY
Glucose-Capillary: 125 mg/dL — ABNORMAL HIGH (ref 70–99)
Glucose-Capillary: 134 mg/dL — ABNORMAL HIGH (ref 70–99)
Glucose-Capillary: 140 mg/dL — ABNORMAL HIGH (ref 70–99)
Glucose-Capillary: 142 mg/dL — ABNORMAL HIGH (ref 70–99)
Glucose-Capillary: 154 mg/dL — ABNORMAL HIGH (ref 70–99)
Glucose-Capillary: 155 mg/dL — ABNORMAL HIGH (ref 70–99)

## 2019-10-06 MED ORDER — FUROSEMIDE 10 MG/ML IJ SOLN
20.0000 mg | Freq: Once | INTRAMUSCULAR | Status: AC
  Administered 2019-10-06: 19:00:00 20 mg via INTRAVENOUS
  Filled 2019-10-06: qty 2

## 2019-10-06 MED ORDER — SODIUM CHLORIDE 3 % IN NEBU
4.0000 mL | INHALATION_SOLUTION | Freq: Four times a day (QID) | RESPIRATORY_TRACT | Status: DC
  Administered 2019-10-06 – 2019-10-07 (×2): 4 mL via RESPIRATORY_TRACT
  Filled 2019-10-06 (×4): qty 4

## 2019-10-06 MED ORDER — TORSEMIDE 20 MG PO TABS
20.0000 mg | ORAL_TABLET | Freq: Every day | ORAL | Status: DC
  Filled 2019-10-06: qty 1

## 2019-10-06 MED ORDER — PANTOPRAZOLE SODIUM 40 MG PO TBEC
40.0000 mg | DELAYED_RELEASE_TABLET | Freq: Every day | ORAL | Status: DC
  Administered 2019-10-06: 40 mg via ORAL
  Filled 2019-10-06 (×2): qty 1

## 2019-10-06 MED ORDER — METOPROLOL TARTRATE 12.5 MG HALF TABLET
25.0000 mg | ORAL_TABLET | Freq: Two times a day (BID) | ORAL | Status: DC
  Administered 2019-10-06: 25 mg via ORAL
  Filled 2019-10-06 (×2): qty 2

## 2019-10-06 MED ORDER — HEPARIN SODIUM (PORCINE) 5000 UNIT/ML IJ SOLN
5000.0000 [IU] | Freq: Two times a day (BID) | INTRAMUSCULAR | Status: DC
  Administered 2019-10-06 – 2019-10-07 (×3): 5000 [IU] via SUBCUTANEOUS
  Filled 2019-10-06 (×3): qty 1

## 2019-10-06 MED ORDER — ASPIRIN EC 325 MG PO TBEC
325.0000 mg | DELAYED_RELEASE_TABLET | Freq: Every day | ORAL | Status: DC
  Administered 2019-10-06: 325 mg via ORAL
  Filled 2019-10-06 (×2): qty 1

## 2019-10-06 NOTE — Progress Notes (Signed)
Paged Stroke MD re: change in NIHSS from previous shift.   Dr Cheral Marker return page.  Discussed finding with provider.  Upon review, noted pt same assessment from 2/19 by Dr Cheral Marker and assessment from Dr Erlinda Hong on 2/21.  Continue to monitor but no changes in orders.

## 2019-10-06 NOTE — Progress Notes (Signed)
STROKE TEAM PROGRESS NOTE   INTERVAL HISTORY Her daughter is at the bedside.  I also talked with patient's son over the phone.  Patient is more lethargic than yesterday, hardly open eyes, still able to follow simple commands on the right, but not able to answer any questions.  CT head right BG infarct without hemorrhage.  Had NG tube yesterday on tube feeding.  However, patient has tachypnea and deep throat secretions, hard to clear.   OBJECTIVE Vitals:   10/06/19 0700 10/06/19 0800 10/06/19 0900 10/06/19 1000  BP: 109/75 (!) 118/54 (!) 109/51 (!) 111/47  Pulse: (!) 107 82 (!) 119 (!) 104  Resp: (!) 32 (!) 33 (!) 33 (!) 31  Temp: 99.2 F (37.3 C)     TempSrc: Axillary     SpO2: 91% 95% 93% 95%  Weight:        CBC:  Recent Labs  Lab 09/17/2019 1835 09/17/2019 1843 10/05/19 0252 10/06/19 0153  WBC 4.4   < > 8.8 10.0  NEUTROABS 3.3  --   --   --   HGB 14.8   < > 14.4 12.8  HCT 46.1*   < > 42.2 39.1  MCV 98.1   < > 95.0 95.8  PLT 161   < > 172 151   < > = values in this interval not displayed.    Basic Metabolic Panel:  Recent Labs  Lab 10/05/19 0252 10/06/19 0153  NA 137 139  K 3.7 4.4  CL 99 105  CO2 23 21*  GLUCOSE 110* 171*  BUN 44* 59*  CREATININE 1.71* 2.09*  CALCIUM 8.8* 8.9    Lipid Panel:     Component Value Date/Time   CHOL 92 10/05/2019 0252   TRIG 61 10/05/2019 0252   HDL 44 10/05/2019 0252   CHOLHDL 2.1 10/05/2019 0252   VLDL 12 10/05/2019 0252   LDLCALC 36 10/05/2019 0252   HgbA1c:  Lab Results  Component Value Date   HGBA1C 6.0 (H) 10/05/2019   Urine Drug Screen:     Component Value Date/Time   LABOPIA NONE DETECTED 10/05/2019 0552   COCAINSCRNUR NONE DETECTED 10/05/2019 0552   LABBENZ NONE DETECTED 10/05/2019 0552   AMPHETMU NONE DETECTED 10/05/2019 0552   THCU NONE DETECTED 10/05/2019 0552   LABBARB NONE DETECTED 10/05/2019 0552    Alcohol Level     Component Value Date/Time   ETH <10 10/10/2019 [place]    IMAGING  CT Code  Stroke CTA Head W/WO contrast CT Code Stroke CTA Neck W/WO contrast 09/30/2019 IMPRESSION:  1. Positive for Emergent Large Vessel Occlusion: Right MCA proximal M1. Little right MCA branch reconstitution.  2. Preliminary results of #1 were communicated to Dr. Leonel Ramsay at 6:07 pm on 09/28/2019 by text page via the [facility] messaging system. He called and we briefly discussed at that time also.  3. No significant extracranial atherosclerosis. There is calcified plaque of both ICA siphons (mild stenoses) and also the Left MCA M1 (moderate stenosis).  4. Two rounded areas of nonspecific ground-glass opacity in the right upper lobe. Initial follow-up by Chest CT without contrast is recommended in 3 months to confirm persistence. This recommendation follows the consensus statement: Recommendations for the Management of Subsolid Pulmonary Nodules Detected at CT: A Statement from the [employer] as published in Radiology 2013; 266:304-317.   MR BRAIN WO CONTRAST 10/05/2019 IMPRESSION:  Acute infarction affecting the right basal ganglia and portions of the corona radiata. No mass effect or hemorrhage. Minimal small area  of acute cortical restricted diffusion at the right temporoparietal junction. Old small cortical infarctions elsewhere affecting the cerebellum, both occipital regions and both parietal regions.   CT HEAD CODE STROKE WO CONTRAST 10/02/2019 IMPRESSION:  1. Hyperdense Right MCA and rightward gaze highly suggestive of ELVO, but no acute infarct changes identified in the right MCA territory. ASPECTS 10.  2. No intracranial hemorrhage identified. Small chronic infarct in the right cerebellum.   CT Head WO Contrast  10/05/19 IMPRESSION: 1. Relatively mild acute infarct changes by CT in the right basal ganglia appearing stable from the MRI early this morning. No hemorrhagic transformation or significant mass effect. 2. No new intracranial abnormality identified.   DG Chest Portable 1  View  10/12/2019 IMPRESSION:  Severe cardiomegaly appears stable along with chronic hypoventilation at the left lung base. No new cardiopulmonary abnormality.   Transthoracic Echocardiogram  10/06/2019 IMPRESSIONS  1. Left ventricular ejection fraction, by estimation, is >75%. The left  ventricle has hyperdynamic function. The left ventricle has no regional  wall motion abnormalities. There is severe left ventricular hypertrophy.  Left ventricular diastolic  parameters are indeterminate. There is the interventricular septum is  flattened in systole and diastole, consistent with right ventricular  pressure and volume overload.  2. Right ventricular systolic function is normal. The right ventricular  size is mildly enlarged. There is severely elevated pulmonary artery  systolic pressure. The estimated right ventricular systolic pressure is  [id number] mmHg.  3. Left atrial size was moderately dilated.  4. Right atrial size was severely dilated.  5. The mitral valve is normal in structure and function. Mild mitral  valve regurgitation. No evidence of mitral stenosis.  6. The tricuspid valve is degenerative. Tricuspid valve regurgitation is  moderate.  7. The aortic valve is normal in structure and function. Aortic valve  regurgitation is trivial. Moderate aortic valve stenosis. Aortic valve  mean gradient measures 24.2 mmHg. Aortic valve Vmax measures 3.24 m/s.  8. The inferior vena cava is dilated in size with >50% respiratory  variability, suggesting right atrial pressure of 8 mmHg.  Conclusion(s)/Recommendation(s): No intracardiac source of embolism detected on this transthoracic study. A transesophageal echocardiogram is recommended to exclude cardiac source of embolism if clinically indicated.   ECG - atrial fibrillation - ventricular response 58 BPM. Probable recent anteroseptal infarct. (See cardiology reading for complete details)   PHYSICAL EXAM Blood pressure (!) 111/47,  pulse (!) 104, temperature 99.2 F (37.3 C), temperature source Axillary, resp. rate (!) 31, weight 46.1 kg, SpO2 95 %.  General - Well nourished, well developed, lethargic.  Ophthalmologic - fundi not visualized due to noncooperation.  Cardiovascular - irregularly irregular heart rate and rhythm, with intermittent RVR.  Neuro - very lethargic, tachypnea with deep secretions, eyes not able to open with voice, not able to answer orientation questions, however, able to follow all simple commands on the right. PERRL, right gaze preference, not able to cross midline. Not blinking to visual threat with forced eye opening. Left facial droop, tongue midline in mouth. RUE 3/5 proximal and 4/5 distally. RLE 2/5 proximal and 3/5 distally. However, LUE flaccid, LLE slight withdraw to pain. No babinski. Sensation, coordination and gait not tested.   HOME MEDICATIONS:   Cristina [name] is a 86 y.o. female with history of  Afib (not on [place] d/t freq falls and age), septal defect, severe mitral regurgitation, hx of head trauma 2016 with punctate hemorrhage left frontal lobe, CHF, DJD, neuropathy, TIA, dementia presenting with left sided  weakness, slurred speech and right gaze. The patient received IV t-PA Friday 09/22/2019 at 17:55. Thrombectomy was discussed with the family however they declined intervention.  Stroke: Acute right MCA infarct - embolic - atrial fibrillation not on AC  CT Head -  Hyperdense Right MCA and rightward gaze highly suggestive of ELVO, but no acute infarct changes identified in the right MCA territory. ASPECTS 10.   CTA H&N - Right MCA proximal M1 ELVO. Little right MCA branch reconstitution. Calcified plaque of both ICA siphons (mild stenoses) and also the Left MCA M1 (moderate stenosis).   MRI head - Acute infarction affecting the right basal ganglia and portions of the corona radiata. Old small cortical infarctions elsewhere affecting the cerebellum, both occipital regions  and both parietal regions.   CT head at 24h tPA - Relatively mild acute infarct at right basal ganglia appearing stable from the MRI earlier.   2D Echo - EF >75%. No cardiac source of emboli identified. Volume overload.  LDL - 36  HgbA1c - 6.0  UDS - negative  VTE prophylaxis - heparin subq  aspirin 81 mg daily prior to admission, now on ASA 325  Patient will counseled to be compliant with her antithrombotic medications  Ongoing aggressive stroke risk factor management  Therapy recommendations: pending  Disposition:  Pending  Discussed with pt son over the phone and daughter at bedside, family would like to give more days to see pt clinical course  Chronic A. fib   Not on AC due to fall risk  Also allergic to Elliquis -> rash  EKG showed A. Fib  Intermittent RVR  Resume home metoprolol - 12.5 -> 25 twice daily  CHF   home meds metoprolol, torsemide, aldactone  TTE EF > 75% with sign of fluid overload  D/c IVF  Continue TF @ 40  Give lasix 20mg  IV once  Resume torsemide tomorrow  CXR pending  AKI on CKD IV  Creatinine - 170->1.80->1.71->2.09  Continue TF  BMP monitoring  Hypotension  BP on lower side but improving . Resume home metoprolol and torsemid . Hold off spironolacton . Close BP monitoring  Hyperlipidemia  Home Lipid lowering medication: Lipitor 20 mg daily  LDL 36, goal < 70  Resume Lipitor 20  Continue statin at discharge  Dysphagia  Not able to pass swallow  Speech on board  On tube feeding  Other Stroke Risk Factors  Advanced age  Hx stroke - by imaging  Other Active Problems  Code status - DNR/DNI    Hospital day # 2  This patient is critically ill due to acute stroke, right MCA occlusion, A. fib with RVR, CHF, hypotension and at significant risk of neurological worsening, death form recurrent stroke, hemorrhagic conversion, heart failure, shock and seizure. This patient's care requires constant monitoring  of vital signs, hemodynamics, respiratory and cardiac monitoring, review of multiple databases, neurological assessment, discussion with family, other specialists and medical decision making of high complexity. I spent 35 minutes of neurocritical care time in the care of this patient. I had long discussion with daughter at bedside and son over the phone, updated pt current condition, treatment plan and potential prognosis, and answered all the questions.  They expressed understanding and appreciation. They would like to give more days to see pt clinical course  Rosalin Hawking, MD PhD Stroke Neurology 10/06/2019 6:38 PM   To contact Stroke Continuity provider, please refer to [email or web address] After hours, contact General Neurology

## 2019-10-07 DIAGNOSIS — N179 Acute kidney failure, unspecified: Secondary | ICD-10-CM | POA: Diagnosis present

## 2019-10-07 DIAGNOSIS — R1310 Dysphagia, unspecified: Secondary | ICD-10-CM

## 2019-10-07 DIAGNOSIS — D696 Thrombocytopenia, unspecified: Secondary | ICD-10-CM | POA: Diagnosis not present

## 2019-10-07 DIAGNOSIS — I482 Chronic atrial fibrillation, unspecified: Secondary | ICD-10-CM | POA: Diagnosis present

## 2019-10-07 DIAGNOSIS — E785 Hyperlipidemia, unspecified: Secondary | ICD-10-CM | POA: Diagnosis present

## 2019-10-07 DIAGNOSIS — N184 Chronic kidney disease, stage 4 (severe): Secondary | ICD-10-CM | POA: Diagnosis present

## 2019-10-07 DIAGNOSIS — I509 Heart failure, unspecified: Secondary | ICD-10-CM

## 2019-10-07 DIAGNOSIS — I959 Hypotension, unspecified: Secondary | ICD-10-CM | POA: Diagnosis present

## 2019-10-07 LAB — CBC
HCT: 38.3 % (ref 36.0–46.0)
Hemoglobin: 12.8 g/dL (ref 12.0–15.0)
MCH: 31.5 pg (ref 26.0–34.0)
MCHC: 33.4 g/dL (ref 30.0–36.0)
MCV: 94.3 fL (ref 80.0–100.0)
Platelets: 126 10*3/uL — ABNORMAL LOW (ref 150–400)
RBC: 4.06 MIL/uL (ref 3.87–5.11)
RDW: 15.1 % (ref 11.5–15.5)
WBC: 6 10*3/uL (ref 4.0–10.5)
nRBC: 0 % (ref 0.0–0.2)

## 2019-10-07 LAB — GLUCOSE, CAPILLARY
Glucose-Capillary: 130 mg/dL — ABNORMAL HIGH (ref 70–99)
Glucose-Capillary: 137 mg/dL — ABNORMAL HIGH (ref 70–99)
Glucose-Capillary: 153 mg/dL — ABNORMAL HIGH (ref 70–99)

## 2019-10-07 LAB — BASIC METABOLIC PANEL
Anion gap: 14 (ref 5–15)
BUN: 100 mg/dL — ABNORMAL HIGH (ref 8–23)
CO2: 21 mmol/L — ABNORMAL LOW (ref 22–32)
Calcium: 9.4 mg/dL (ref 8.9–10.3)
Chloride: 108 mmol/L (ref 98–111)
Creatinine, Ser: 2.58 mg/dL — ABNORMAL HIGH (ref 0.44–1.00)
GFR calc Af Amer: 18 mL/min — ABNORMAL LOW (ref >60)
GFR calc non Af Amer: 15 mL/min — ABNORMAL LOW (ref >60)
Glucose, Bld: 154 mg/dL — ABNORMAL HIGH (ref 70–99)
Potassium: 4.4 mmol/L (ref 3.5–5.1)
Sodium: 143 mmol/L (ref 135–145)

## 2019-10-07 MED ORDER — LORAZEPAM 2 MG/ML IJ SOLN: 1.0000 mg | INTRAMUSCULAR | Status: DC | PRN

## 2019-10-07 MED ORDER — ONDANSETRON 4 MG PO TBDP: 4.0000 mg | ORAL_TABLET | Freq: Four times a day (QID) | ORAL | Status: DC | PRN

## 2019-10-07 MED ORDER — LORAZEPAM 1 MG PO TABS: 1.0000 mg | ORAL_TABLET | ORAL | Status: DC | PRN

## 2019-10-07 MED ORDER — SODIUM CHLORIDE 0.9 % IV BOLUS
1000.0000 mL | Freq: Once | INTRAVENOUS | Status: AC
  Administered 2019-10-07: 06:00:00 1000 mL via INTRAVENOUS

## 2019-10-07 MED ORDER — HALOPERIDOL 0.5 MG PO TABS
0.5000 mg | ORAL_TABLET | ORAL | Status: DC | PRN
  Filled 2019-10-07: qty 1

## 2019-10-07 MED ORDER — VITAMIN B-12 1000 MCG PO TABS
1000.0000 ug | ORAL_TABLET | Freq: Every day | ORAL | Status: DC
  Administered 2019-10-07: 09:00:00 1000 ug
  Filled 2019-10-07: qty 1

## 2019-10-07 MED ORDER — METOPROLOL TARTRATE 12.5 MG HALF TABLET
25.0000 mg | ORAL_TABLET | Freq: Two times a day (BID) | ORAL | Status: DC
  Administered 2019-10-07: 25 mg

## 2019-10-07 MED ORDER — MORPHINE BOLUS VIA INFUSION
4.0000 mg | Freq: Once | INTRAVENOUS | Status: AC
  Administered 2019-10-07: 12:00:00 4 mg via INTRAVENOUS
  Filled 2019-10-07: qty 4

## 2019-10-07 MED ORDER — METOPROLOL TARTRATE 5 MG/5ML IV SOLN
2.5000 mg | Freq: Once | INTRAVENOUS | Status: AC
  Administered 2019-10-07: 06:00:00 2.5 mg via INTRAVENOUS
  Filled 2019-10-07: qty 5

## 2019-10-07 MED ORDER — GLYCOPYRROLATE 0.2 MG/ML IJ SOLN: 0.2000 mg | INTRAMUSCULAR | Status: DC | PRN

## 2019-10-07 MED ORDER — ASPIRIN 325 MG PO TABS
325.0000 mg | ORAL_TABLET | Freq: Every day | ORAL | Status: DC
  Filled 2019-10-07: qty 1

## 2019-10-07 MED ORDER — LORAZEPAM 2 MG/ML PO CONC: 1.0000 mg | ORAL | Status: DC | PRN

## 2019-10-07 MED ORDER — FREE WATER: 150.0000 mL | Freq: Four times a day (QID) | Status: DC

## 2019-10-07 MED ORDER — SODIUM CHLORIDE 0.9 % IV SOLN: INTRAVENOUS | Status: DC

## 2019-10-07 MED ORDER — BIOTENE DRY MOUTH MT LIQD: 15.0000 mL | OROMUCOSAL | Status: DC | PRN

## 2019-10-07 MED ORDER — ADULT MULTIVITAMIN W/MINERALS CH
1.0000 | ORAL_TABLET | Freq: Every day | ORAL | Status: DC
  Administered 2019-10-07: 09:00:00 1

## 2019-10-07 MED ORDER — GLYCOPYRROLATE 1 MG PO TABS: 1.0000 mg | ORAL_TABLET | ORAL | Status: DC | PRN

## 2019-10-07 MED ORDER — HALOPERIDOL LACTATE 2 MG/ML PO CONC
0.5000 mg | ORAL | Status: DC | PRN
  Filled 2019-10-07: qty 0.3

## 2019-10-07 MED ORDER — ATORVASTATIN CALCIUM 40 MG PO TABS: 20.0000 mg | ORAL_TABLET | Freq: Every day | ORAL | Status: DC

## 2019-10-07 MED ORDER — TORSEMIDE 20 MG PO TABS
20.0000 mg | ORAL_TABLET | Freq: Every day | ORAL | Status: DC
  Administered 2019-10-07: 20 mg
  Filled 2019-10-07: qty 1

## 2019-10-07 MED ORDER — MORPHINE 100MG IN NS 100ML (1MG/ML) PREMIX INFUSION
1.0000 mg/h | INTRAVENOUS | Status: DC
  Administered 2019-10-07: 12:00:00 5 mg/h via INTRAVENOUS
  Filled 2019-10-07: qty 100

## 2019-10-07 MED ORDER — ACETAMINOPHEN 325 MG PO TABS: 650.0000 mg | ORAL_TABLET | Freq: Four times a day (QID) | ORAL | Status: DC | PRN

## 2019-10-07 MED ORDER — ACETAMINOPHEN 650 MG RE SUPP: 650.0000 mg | Freq: Four times a day (QID) | RECTAL | Status: DC | PRN

## 2019-10-07 MED ORDER — SCOPOLAMINE 1 MG/3DAYS TD PT72
1.0000 | MEDICATED_PATCH | TRANSDERMAL | Status: DC
  Filled 2019-10-07: qty 1

## 2019-10-07 MED ORDER — HALOPERIDOL LACTATE 5 MG/ML IJ SOLN: 0.5000 mg | INTRAMUSCULAR | Status: DC | PRN

## 2019-10-07 MED ORDER — FREE WATER
100.0000 mL | Status: DC
  Administered 2019-10-07: 08:00:00 100 mL

## 2019-10-07 MED ORDER — ONDANSETRON HCL 4 MG/2ML IJ SOLN: 4.0000 mg | Freq: Four times a day (QID) | INTRAMUSCULAR | Status: DC | PRN

## 2019-10-07 MED ORDER — POLYVINYL ALCOHOL 1.4 % OP SOLN
1.0000 [drp] | Freq: Four times a day (QID) | OPHTHALMIC | Status: DC | PRN
  Filled 2019-10-07: qty 15

## 2019-10-07 MED ORDER — PANTOPRAZOLE SODIUM 40 MG PO PACK
40.0000 mg | PACK | Freq: Every day | ORAL | Status: DC
  Filled 2019-10-07: qty 20

## 2019-10-14 NOTE — Plan of Care (Signed)
RN reported pt tachycardia and tachypnea with down trending O2 sats. Now on NRB. Came by at bedside, son Rod is at bedside. I had long discussion with him at bedside, updated pt current condition, treatment plan and poor prognosis, and answered all the questions. He expressed understanding and appreciation, and requested comfort care measure given poor prognosis and impending decline. However, he requested to keep NRB and feeding tube until other family arrives. I agreed with comfort care and and put in orders. RN notified.   Rosalin Hawking, MD PhD Stroke Neurology 2019-10-17 1:17 PM

## 2019-10-14 NOTE — Death Summary Note (Addendum)
Stroke Discharge Summary  Patient ID: Cristina [name]   MRN: [id number]      DOB: 1923-09-24  Date of Admission: 09/16/2019 Date of Discharge: October 25, 2019  Attending Physician:  Rosalin Hawking, MD, Stroke MD Consultant(s):    None  Patient's PCP:  [place]  DISCHARGE DIAGNOSIS:  Principal Problem:   Cerebral embolism with cerebral infarction R MCA d/t AF not on [facility], s/p tPA Active Problems:   Weakness acquired in ICU   Pressure injury of skin   Chronic atrial fibrillation with RVR ([place])   CHF (congestive heart failure) ([place])   AKI (acute kidney injury) ([place])   CKD (chronic kidney disease), stage IV ([place])   Hypotension   Hyperlipemia   Dysphagia   Thrombocytopenia ([facility])  LABORATORY STUDIES CBC    Component Value Date/Time   WBC 6.0 October 25, 2019 0248   RBC 4.06 Oct 25, 2019 0248   HGB 12.8 10/25/2019 0248   HGB 8.6 (L) 10/05/2012 0712   HCT 38.3 2019/10/25 0248   HCT 26.4 (L) 10/03/2012 0510   PLT 126 (L) 10/25/2019 0248   PLT 123 (L) 10/03/2012 0510   MCV 94.3 Oct 25, 2019 0248   MCV 94 10/03/2012 0510   MCH 31.5 2019-10-25 0248   MCHC 33.4 10/25/2019 0248   RDW 15.1 2019/10/25 0248   RDW 14.4 10/03/2012 0510   LYMPHSABS 0.5 (L) 09/24/2019 1835   LYMPHSABS 1.0 10/03/2012 0510   MONOABS 0.4 09/30/2019 1835   MONOABS 0.6 10/03/2012 0510   EOSABS 0.1 09/16/2019 1835   EOSABS 0.2 10/03/2012 0510   BASOSABS 0.0 09/17/2019 1835   BASOSABS 0.0 10/03/2012 0510   CMP    Component Value Date/Time   NA 143 Oct 25, 2019 0248   NA 135 (L) 10/03/2012 0510   K 4.4 October 25, 2019 0248   K 4.3 10/03/2012 0510   CL 108 Oct 25, 2019 0248   CL 100 10/03/2012 0510   CO2 21 (L) October 25, 2019 0248   CO2 29 10/03/2012 0510   GLUCOSE 154 (H) 10/25/19 0248   GLUCOSE 126 (H) 10/03/2012 0510   BUN 100 (H) 10-25-2019 0248   BUN 18 10/03/2012 0510   CREATININE 2.58 (H) 2019-10-25 0248   CREATININE 0.89 10/03/2012 0510   CALCIUM 9.4 10-25-19 0248   CALCIUM 8.2 (L) 10/03/2012 0510    PROT 7.1 09/19/2019 1835   PROT 6.7 10/01/2012 1907   ALBUMIN 3.7 09/23/2019 1835   ALBUMIN 3.6 10/01/2012 1907   AST 38 09/27/2019 1835   AST 39 (H) 10/01/2012 1907   ALT 25 10/06/2019 1835   ALT 32 10/01/2012 1907   ALKPHOS 97 10/02/2019 1835   ALKPHOS 64 10/01/2012 1907   BILITOT 1.4 (H) 10/06/2019 1835   BILITOT 0.4 10/01/2012 1907   GFRNONAA 15 (L) 10-25-2019 0248   GFRNONAA 58 (L) 10/03/2012 0510   GFRAA 18 (L) 10/25/19 0248   GFRAA >60 10/03/2012 0510   COAGS Lab Results  Component Value Date   INR 1.2 09/24/2019   INR 1.04 07/06/2015   Lipid Panel    Component Value Date/Time   CHOL 92 10/05/2019 0252   TRIG 61 10/05/2019 0252   HDL 44 10/05/2019 0252   CHOLHDL 2.1 10/05/2019 0252   VLDL 12 10/05/2019 0252   LDLCALC 36 10/05/2019 0252   HgbA1C  Lab Results  Component Value Date   HGBA1C 6.0 (H) 10/05/2019   Urinalysis    Component Value Date/Time   COLORURINE YELLOW 10/05/2019 0552   APPEARANCEUR HAZY (A) 10/05/2019 [id number]  LABSPEC 1.032 (H) 10/05/2019 0552   PHURINE 7.0 10/05/2019 0552   GLUCOSEU NEGATIVE 10/05/2019 0552   HGBUR NEGATIVE 10/05/2019 0552   BILIRUBINUR NEGATIVE 10/05/2019 0552   KETONESUR NEGATIVE 10/05/2019 0552   PROTEINUR NEGATIVE 10/05/2019 0552   NITRITE NEGATIVE 10/05/2019 0552   LEUKOCYTESUR NEGATIVE 10/05/2019 0552   Urine Drug Screen     Component Value Date/Time   LABOPIA NONE DETECTED 10/05/2019 0552   COCAINSCRNUR NONE DETECTED 10/05/2019 0552   LABBENZ NONE DETECTED 10/05/2019 0552   AMPHETMU NONE DETECTED 10/05/2019 0552   THCU NONE DETECTED 10/05/2019 0552   LABBARB NONE DETECTED 10/05/2019 0552    Alcohol Level    Component Value Date/Time   ETH <10 10/05/2019 1835    SIGNIFICANT DIAGNOSTIC STUDIES CT Code Stroke CTA Head W/WO contrast  Result Date: 09/23/2019 CLINICAL DATA:  86 year old female with code stroke presentation and evidence of right MCA emergent large vessel occlusion on plain head CT.  EXAM: CT ANGIOGRAPHY HEAD AND NECK TECHNIQUE: Multidetector CT imaging of the head and neck was performed using the standard protocol during bolus administration of intravenous contrast. Multiplanar CT image reconstructions and MIPs were obtained to evaluate the vascular anatomy. Carotid stenosis measurements (when applicable) are obtained utilizing NASCET criteria, using the distal internal carotid diameter as the denominator. CONTRAST:  76mL OMNIPAQUE IOHEXOL 350 MG/ML SOLN COMPARISON:  Plain head CT 1749 hours today. FINDINGS: CTA NECK Skeleton: Osteopenia. Advanced TMJ degeneration. Degenerative changes in the cervical spine, although mostly mild for age. Partially visible scoliosis in the upper thoracic spine. No acute osseous abnormality identified. Upper chest: Mild mosaic attenuation in the upper lungs. There are also 2 small areas of pulmonary ground-glass opacity in the right upper lobe, the larger on series 5, image 151. No pleural effusion. No superior mediastinal lymphadenopathy. Other neck: No acute neck soft tissue findings. Aortic arch: 3 vessel arch configuration. Mild for age arch atherosclerosis. Right carotid system: No brachiocephalic or right CCA origin plaque or stenosis. Mild calcified plaque at the right carotid bifurcation without stenosis. Left carotid system: Negative left CCA origin. Negative left carotid bifurcation. Mildly beaded appearance of the cervical left ICA in keeping with fibromuscular dysplasia (series 12, image 28). No stenosis. Vertebral arteries: No proximal right subclavian artery or right vertebral artery origin plaque or stenosis. The right vertebral is widely patent to the skull base without stenosis. Mild proximal left subclavian artery plaque without stenosis. Left vertebral artery origin is patent without stenosis. There is mild left V1 segment calcified plaque and tortuosity without stenosis. The left vertebral is patent to the skull base without stenosis. CTA HEAD  Posterior circulation: Mild left V4 segment calcified plaque, no stenosis. Normal PICA origins. No distal vertebral or vertebrobasilar junction stenosis. Patent basilar artery without stenosis. Normal SCA and PCA origins. Mildly ectatic basilar tip. Posterior communicating arteries are diminutive or absent. Bilateral PCA branches are within normal limits. Anterior circulation: Both ICA siphons are patent. On the left there is moderate cavernous and supraclinoid segment calcified plaque but mild if any stenosis. Similar moderate calcified plaque of the right ICA siphon with mild supraclinoid stenosis. Patent carotid termini. Patent MCA and ACA origins. Right ACA A1 is mildly dominant. Anterior communicating artery and bilateral ACA branches are within normal limits. There is left MCA M1 calcified plaque with moderate associated stenosis (series 9, image 119). But the left MCA remains patent with no stenosis at the bifurcation. Left MCA branches are within normal limits. The right MCA M1 occludes abruptly 5  mm beyond its origin (series 11, image 18). There is little collateral right MCA branch enhancement (series 10, image 15). Venous sinuses: Early contrast timing, not well evaluated. Anatomic variants: Mildly dominant right A1. Review of the MIP images confirms the above findings IMPRESSION: 1. Positive for Emergent Large Vessel Occlusion: Right MCA proximal M1. Little right MCA branch reconstitution. 2. Preliminary results of #1 were communicated to Dr. Leonel Ramsay at 6:07 pm on 10/06/2019 by text page via the [facility] messaging system. He called and we briefly discussed at that time also. 3. No significant extracranial atherosclerosis. There is calcified plaque of both ICA siphons (mild stenoses) and also the Left MCA M1 (moderate stenosis). 4. Two rounded areas of nonspecific ground-glass opacity in the right upper lobe. Initial follow-up by Chest CT without contrast is recommended in 3 months to confirm persistence.  This recommendation follows the consensus statement: Recommendations for the Management of Subsolid Pulmonary Nodules Detected at CT: A Statement from the [place] as published in Radiology 2013; 266:304-317. Electronically Signed   By: Genevie Ann M.D.   On: 09/27/2019 18:18   DG Abd 1 View  Result Date: 10/05/2019 CLINICAL DATA:  NG tube placement. EXAM: ABDOMEN - 1 VIEW COMPARISON:  CT abdomen 08/23/2019, chest x-ray 09/27/2019 FINDINGS: NG tube is present with tip over the stomach in the left upper quadrant. Bowel gas pattern is nonobstructive as there is a paucity of bowel gas present. Moderate degenerative changes of the spine. Hazy bibasilar opacification with slight worsening in the right base. IMPRESSION: 1.  NG tube with tip over the stomach in the left upper quadrant. 2. Nonobstructive bowel gas pattern with paucity of bowel gas present. 3. Bibasilar opacification with slight worsening opacification in the right base. Electronically Signed   By: Marin Olp M.D.   On: 10/05/2019 15:17   CT HEAD WO CONTRAST  Result Date: 10/05/2019 CLINICAL DATA:  86 year old female status post right M1 emergent large vessel occlusion 24 hours status post IV tPA. EXAM: CT HEAD WITHOUT CONTRAST TECHNIQUE: Contiguous axial images were obtained from the base of the skull through the vertex without intravenous contrast. COMPARISON:  Brain MRI 0340 hours today. Presentation head CT 1750 hours yesterday. FINDINGS: Brain: Relatively mild hypodensity in the right basal ganglia corresponding to the most pronounced area of restricted diffusion by MRI earlier today. No hemorrhagic transformation. Minimal mass effect on the right lateral ventricle (series 3, image 18). Elsewhere gray-white matter differentiation appears not significantly changed. No ventriculomegaly. No midline shift. Patent basilar cisterns. Chronic right cerebellar infarct. Vascular: Calcified atherosclerosis at the skull base. Less hyperdensity of  the right MCA M1 segment today (series 3, image 12). Skull: No acute osseous abnormality identified. Sinuses/Orbits: Visualized paranasal sinuses and mastoids are stable and well pneumatized. Left nasal enteric tube in place. Other: No acute orbit or scalp soft tissue findings. IMPRESSION: 1. Relatively mild acute infarct changes by CT in the right basal ganglia appearing stable from the MRI early this morning. No hemorrhagic transformation or significant mass effect. 2. No new intracranial abnormality identified. Electronically Signed   By: Genevie Ann M.D.   On: 10/05/2019 19:46   CT Code Stroke CTA Neck W/WO contrast  Result Date: 09/21/2019 CLINICAL DATA:  86 year old female with code stroke presentation and evidence of right MCA emergent large vessel occlusion on plain head CT. EXAM: CT ANGIOGRAPHY HEAD AND NECK TECHNIQUE: Multidetector CT imaging of the head and neck was performed using the standard protocol during bolus administration of intravenous contrast.  Multiplanar CT image reconstructions and MIPs were obtained to evaluate the vascular anatomy. Carotid stenosis measurements (when applicable) are obtained utilizing NASCET criteria, using the distal internal carotid diameter as the denominator. CONTRAST:  63mL OMNIPAQUE IOHEXOL 350 MG/ML SOLN COMPARISON:  Plain head CT 1749 hours today. FINDINGS: CTA NECK Skeleton: Osteopenia. Advanced TMJ degeneration. Degenerative changes in the cervical spine, although mostly mild for age. Partially visible scoliosis in the upper thoracic spine. No acute osseous abnormality identified. Upper chest: Mild mosaic attenuation in the upper lungs. There are also 2 small areas of pulmonary ground-glass opacity in the right upper lobe, the larger on series 5, image 151. No pleural effusion. No superior mediastinal lymphadenopathy. Other neck: No acute neck soft tissue findings. Aortic arch: 3 vessel arch configuration. Mild for age arch atherosclerosis. Right carotid system:  No brachiocephalic or right CCA origin plaque or stenosis. Mild calcified plaque at the right carotid bifurcation without stenosis. Left carotid system: Negative left CCA origin. Negative left carotid bifurcation. Mildly beaded appearance of the cervical left ICA in keeping with fibromuscular dysplasia (series 12, image 28). No stenosis. Vertebral arteries: No proximal right subclavian artery or right vertebral artery origin plaque or stenosis. The right vertebral is widely patent to the skull base without stenosis. Mild proximal left subclavian artery plaque without stenosis. Left vertebral artery origin is patent without stenosis. There is mild left V1 segment calcified plaque and tortuosity without stenosis. The left vertebral is patent to the skull base without stenosis. CTA HEAD Posterior circulation: Mild left V4 segment calcified plaque, no stenosis. Normal PICA origins. No distal vertebral or vertebrobasilar junction stenosis. Patent basilar artery without stenosis. Normal SCA and PCA origins. Mildly ectatic basilar tip. Posterior communicating arteries are diminutive or absent. Bilateral PCA branches are within normal limits. Anterior circulation: Both ICA siphons are patent. On the left there is moderate cavernous and supraclinoid segment calcified plaque but mild if any stenosis. Similar moderate calcified plaque of the right ICA siphon with mild supraclinoid stenosis. Patent carotid termini. Patent MCA and ACA origins. Right ACA A1 is mildly dominant. Anterior communicating artery and bilateral ACA branches are within normal limits. There is left MCA M1 calcified plaque with moderate associated stenosis (series 9, image 119). But the left MCA remains patent with no stenosis at the bifurcation. Left MCA branches are within normal limits. The right MCA M1 occludes abruptly 5 mm beyond its origin (series 11, image 18). There is little collateral right MCA branch enhancement (series 10, image 15). Venous  sinuses: Early contrast timing, not well evaluated. Anatomic variants: Mildly dominant right A1. Review of the MIP images confirms the above findings IMPRESSION: 1. Positive for Emergent Large Vessel Occlusion: Right MCA proximal M1. Little right MCA branch reconstitution. 2. Preliminary results of #1 were communicated to Dr. Leonel Ramsay at 6:07 pm on 09/16/2019 by text page via the [facility] messaging system. He called and we briefly discussed at that time also. 3. No significant extracranial atherosclerosis. There is calcified plaque of both ICA siphons (mild stenoses) and also the Left MCA M1 (moderate stenosis). 4. Two rounded areas of nonspecific ground-glass opacity in the right upper lobe. Initial follow-up by Chest CT without contrast is recommended in 3 months to confirm persistence. This recommendation follows the consensus statement: Recommendations for the Management of Subsolid Pulmonary Nodules Detected at CT: A Statement from the [place] as published in Radiology 2013; 266:304-317. Electronically Signed   By: Genevie Ann M.D.   On: 09/21/2019 18:18   MR  BRAIN WO CONTRAST  Result Date: 10/05/2019 CLINICAL DATA:  Follow-up stroke.  Right MCA occlusion. EXAM: MRI HEAD WITHOUT CONTRAST TECHNIQUE: Multiplanar, multiecho pulse sequences of the brain and surrounding structures were obtained without intravenous contrast. COMPARISON:  CT studies 09/28/2019 FINDINGS: Brain: Diffusion imaging shows restricted diffusion consistent with acute infarction affecting the right basal ganglia and portions of the right corona radiata. There is only a small region of cortical infarction at the temporoparietal junction. No other vascular territory acute insult. Brainstem is normal. Old small vessel infarctions in the right cerebellum. Cerebral hemispheres elsewhere show generalized atrophy and mild chronic small-vessel change of the white matter. Old subcortical infarction in both parietal lobes. Small old bilateral  occipital infarctions No hemorrhage, hydrocephalus or extra-axial collection. Vascular: Major vessels at the base of the brain show flow. Skull and upper cervical spine: Negative Sinuses/Orbits: Clear/normal Other: None IMPRESSION: Acute infarction affecting the right basal ganglia and portions of the corona radiata. No mass effect or hemorrhage. Minimal small area of acute cortical restricted diffusion at the right temporoparietal junction. Old small cortical infarctions elsewhere affecting the cerebellum, both occipital regions and both parietal regions. Electronically Signed   By: Nelson Chimes M.D.   On: 10/05/2019 04:18   DG CHEST PORT 1 VIEW  Result Date: 10/06/2019 CLINICAL DATA:  86 year old female with history of congestive heart failure. EXAM: PORTABLE CHEST 1 VIEW COMPARISON:  Chest x-ray 10/02/2019. FINDINGS: A feeding tube is seen extending into the abdomen, however, the tip of the feeding tube extends below the lower margin of the image. Extensive bibasilar opacities (left greater than right), which may reflect worsening areas of atelectasis and/or consolidation. Moderate left and small right pleural effusions, increased compared to the prior study. Cephalization of the pulmonary vasculature with indistinct interstitial markings. Cardiomegaly. Upper mediastinal contours are within normal limits. Aortic atherosclerosis. IMPRESSION: 1. Worsening bibasilar opacities (left greater than right) which may reflect areas of atelectasis and/or consolidation. 2. Increasing moderate left and small right pleural effusions. 3. Cardiomegaly with evidence of mild interstitial pulmonary edema, concerning for congestive heart failure. Electronically Signed   By: Vinnie Langton M.D.   On: 10/06/2019 19:42   DG Chest Portable 1 View  Result Date: 10/02/2019 CLINICAL DATA:  86 year old female with hypoxia. EXAM: PORTABLE CHEST 1 VIEW COMPARISON:  CT Chest, Abdomen, and Pelvis 08/23/2019, and earlier. FINDINGS:  Portable AP semi upright view at 1842 hours. Rotated to the left. Stable severe cardiomegaly and mediastinal contours. Continued hypo ventilation at the left lung base, not significantly changed from January. No superimposed pneumothorax. Pulmonary vascularity appears stable, no acute edema. Visualized tracheal air column is within normal limits. Osteopenia. Healing left anterolateral 5th rib fracture. Stable mild T12 compression fracture. No acute osseous abnormality identified. Partially visible proximal left humerus ORIF. IMPRESSION: Severe cardiomegaly appears stable along with chronic hypoventilation at the left lung base. No new cardiopulmonary abnormality. Electronically Signed   By: Genevie Ann M.D.   On: 10/05/2019 19:11   ECHOCARDIOGRAM COMPLETE  Result Date: 10/05/2019    ECHOCARDIOGRAM REPORT   Patient Name:   RENADA ROEPER Deiter Date of Exam: 10/05/2019 Medical Rec #:  [id number]         Height:       65.0 in Accession #:    [id number]        Weight:       96.8 lb Date of Birth:  [birth year]         BSA:  1.453 m Patient Age:    34 years          BP:           99/54 mmHg Patient Gender: F                 HR:           103 bpm. Exam Location:  Inpatient Procedure: 2D Echo, Cardiac Doppler and Color Doppler Indications:    Stroke 434.91/I163.9  History:        Patient has prior history of Echocardiogram examinations, most                 recent 11/04/2015. CHF, Aortic Valve Disease and Mitral Valve                 Disease; Arrythmias:Atrial Fibrillation. ASD.  Sonographer:    Clayton Lefort RDCS (AE) Referring Phys: [phone] MCNEILL P [place]  1. Left ventricular ejection fraction, by estimation, is >75%. The left ventricle has hyperdynamic function. The left ventricle has no regional wall motion abnormalities. There is severe left ventricular hypertrophy. Left ventricular diastolic parameters are indeterminate. There is the interventricular septum is flattened in systole and diastole,  consistent with right ventricular pressure and volume overload.  2. Right ventricular systolic function is normal. The right ventricular size is mildly enlarged. There is severely elevated pulmonary artery systolic pressure. The estimated right ventricular systolic pressure is [id number] mmHg.  3. Left atrial size was moderately dilated.  4. Right atrial size was severely dilated.  5. The mitral valve is normal in structure and function. Mild mitral valve regurgitation. No evidence of mitral stenosis.  6. The tricuspid valve is degenerative. Tricuspid valve regurgitation is moderate.  7. The aortic valve is normal in structure and function. Aortic valve regurgitation is trivial. Moderate aortic valve stenosis. Aortic valve mean gradient measures 24.2 mmHg. Aortic valve Vmax measures 3.24 m/s.  8. The inferior vena cava is dilated in size with >50% respiratory variability, suggesting right atrial pressure of 8 mmHg. Conclusion(s)/Recommendation(s): No intracardiac source of embolism detected on this transthoracic study. A transesophageal echocardiogram is recommended to exclude cardiac source of embolism if clinically indicated. FINDINGS  Left Ventricle: Left ventricular ejection fraction, by estimation, is >75%. The left ventricle has hyperdynamic function. The left ventricle has no regional wall motion abnormalities. The left ventricular internal cavity size was normal in size. There is severe left ventricular hypertrophy. The interventricular septum is flattened in systole and diastole, consistent with right ventricular pressure and volume overload. Left ventricular diastolic parameters are indeterminate. Right Ventricle: The right ventricular size is mildly enlarged. No increase in right ventricular wall thickness. Right ventricular systolic function is normal. There is severely elevated pulmonary artery systolic pressure. The tricuspid regurgitant velocity is 4.78 m/s, and with an assumed right atrial pressure of 8  mmHg, the estimated right ventricular systolic pressure is [id number] mmHg. Left Atrium: Left atrial size was moderately dilated. Right Atrium: Right atrial size was severely dilated. Pericardium: A small pericardial effusion is present. The pericardial effusion is posterior to the left ventricle. There is no evidence of cardiac tamponade. Mitral Valve: The mitral valve is normal in structure and function. Normal mobility of the mitral valve leaflets. Mild mitral valve regurgitation. No evidence of mitral valve stenosis. Tricuspid Valve: The tricuspid valve is degenerative in appearance. Tricuspid valve regurgitation is moderate . No evidence of tricuspid stenosis. Aortic Valve: The aortic valve is normal in structure and function. Aortic valve regurgitation is  trivial. Moderate aortic stenosis is present. Aortic valve mean gradient measures 24.2 mmHg. Aortic valve peak gradient measures 42.0 mmHg. Aortic valve area, by VTI measures 0.55 cm. Pulmonic Valve: The pulmonic valve was normal in structure. Pulmonic valve regurgitation is trivial. No evidence of pulmonic stenosis. Aorta: The aortic root is normal in size and structure. Venous: The inferior vena cava is dilated in size with greater than 50% respiratory variability, suggesting right atrial pressure of 8 mmHg. IAS/Shunts: No atrial level shunt detected by color flow Doppler.  LEFT VENTRICLE PLAX 2D LVIDd:         1.70 cm LVIDs:         1.00 cm LV PW:         2.40 cm LV IVS:        1.40 cm LVOT diam:     1.90 cm LV SV:         31.23 ml LV SV Index:   21.50 LVOT Area:     2.84 cm  RIGHT VENTRICLE            IVC RV Basal diam:  2.80 cm    IVC diam: 2.20 cm RV Mid diam:    2.70 cm RV S prime:     3.86 cm/s TAPSE (M-mode): 0.8 cm LEFT ATRIUM             Index       RIGHT ATRIUM           Index LA diam:        3.70 cm 2.55 cm/m  RA Area:     34.80 cm LA Vol (A2C):   79.7 ml 54.85 ml/m RA Volume:   136.00 ml 93.60 ml/m LA Vol (A4C):   50.0 ml 34.41 ml/m LA Biplane  Vol: 63.2 ml 43.49 ml/m  AORTIC VALVE AV Area (Vmax):    0.55 cm AV Area (Vmean):   0.52 cm AV Area (VTI):     0.55 cm AV Vmax:           324.00 cm/s AV Vmean:          227.600 cm/s AV VTI:            0.568 m AV Peak Grad:      42.0 mmHg AV Mean Grad:      24.2 mmHg LVOT Vmax:         62.50 cm/s LVOT Vmean:        41.980 cm/s LVOT VTI:          0.110 m LVOT/AV VTI ratio: 0.19  AORTA Ao Root diam: 2.70 cm Ao Asc diam:  2.80 cm TRICUSPID VALVE TV Peak grad:   91.4 mmHg TV Vmax:        4.78 m/s TR Peak grad:   91.4 mmHg TR Vmax:        478.00 cm/s  SHUNTS Systemic VTI:  0.11 m Systemic Diam: 1.90 cm Candee Furbish MD Electronically signed by Candee Furbish MD Signature Date/Time: 10/05/2019/4:27:48 PM    Final    CT HEAD CODE STROKE WO CONTRAST  Result Date: 10/12/2019 CLINICAL DATA:  Code stroke. 86 year old female with left side weakness and rightward gaze. EXAM: CT HEAD WITHOUT CONTRAST TECHNIQUE: Contiguous axial images were obtained from the base of the skull through the vertex without intravenous contrast. COMPARISON:  Brain MRI 09/30/2016.  Head CT 08/23/2019. FINDINGS: Brain: No intracranial mass effect or midline shift. Stable ventricle size and configuration. No right MCA territory cytotoxic edema identified.  Gray-white matter differentiation appears to be stable since January in both hemispheres. Chronic right cerebellar infarct. Small area of chronic right occipital pole encephalomalacia. Vascular: Calcified atherosclerosis at the skull base. Chronic MCA calcified atherosclerosis also noted, but hyperdense right MCA M1 and bifurcation is new. Skull: Osteopenia.  No acute osseous abnormality identified. Sinuses/Orbits: Visualized paranasal sinuses and mastoids are stable and well pneumatized. Other: Rightward gaze. No other acute orbit or scalp soft tissue finding. ASPECTS [facility] Stroke Program Early CT Score) Total score (0-10 with 10 being normal): 10 IMPRESSION: 1. Hyperdense Right MCA and rightward  gaze highly suggestive of ELVO, but no acute infarct changes identified in the right MCA territory. ASPECTS 10. 2. No intracranial hemorrhage identified. Small chronic infarct in the right cerebellum. 3. These results were communicated to Dr. Leonel Ramsay at 5:57 pm on 09/20/2019 by text page via the [facility] messaging system. Electronically Signed   By: Genevie Ann M.D.   On: 10/09/2019 17:59      HISTORY OF PRESENT ILLNESS Marcelyn Pullar [place] is an 86 y.o. female with PMH of Afib (not on [place] d/t freq falls and age), septal defect, severe mitral regurgitation, CHF, DJD, neuropathy, TIA, dementia, who resides at home with family and uses a wheelchair and needs some assistance (mRS 3). Today 09/16/2019, while sitting on the couch with family she was getting ready for dinner, speaking normally. Moments after she got up to go to kitchen, she suddenly developed left side weakness and slurred speech. This was witnessed by family at 12 who immediately called EMS. Upon arrival to the ER she was found to have dense left hemiparesis, right gaze. She was emergently brought to the CT scanner where there was no bleed on initial imagiang, thus she was treated with IV Alteplase at 1755. CTA showed R MCA M1 occlusion. Case was d/w family by phone who consented to IV tPA treatment, but agree with not pursuing aggressive measures such as thrombectomy. Goals of care were discussed and pt had previously d/w family that she would not want intubation. She will be DNR/DNI but family desires current level of care in an effort to reverse her stroke symptoms.   HOSPITAL COURSE Ms. CHRYEL STINSON is a 86 y.o. female with history of  Afib (not on [place] d/t freq falls and age), septal defect, severe mitral regurgitation, hx of head trauma 2016 with punctate hemorrhage left frontal lobe, CHF, DJD, neuropathy, TIA, dementia presenting with left sided weakness, slurred speech and right gaze. The patient received IV t-PA Friday 10/03/2019 at 17:55.    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 ASPECTS 10.   CTA H&N - Right MCA proximal M1 ELVO. Little right MCA branch reconstitution. Calcified plaque of both ICA siphons (mild stenoses) and also the Left MCA M1 (moderate stenosis).   MRI head - Acute infarction affecting the right basal ganglia and portions of the corona radiata. Old small cortical infarctions elsewhere affecting the cerebellum, both occipital regions and both parietal regions.   CT head at 24h tPA - Relatively mild acute infarct at right basal ganglia appearing stable from the MRI earlier.   2D Echo - EF >75%. No cardiac source of emboli identified. Volume overload.  LDL - 36  HgbA1c - 6.0  UDS - negative  aspirin 81 mg daily  prior to admission, treated with ASA 325 in the hospital  Due worsening with tachycardia and tachypnea with down trending O2 sats on the morning on 2019-10-23, family has opted for comfort care. Pt DNR and comfort orders placed.   Treatment below stopped for comfort care  Patient rapidly declined and died at 1236-11-21 the same day  Chronic A. fib w/ Intermittent RVR  Not on AC due to fall risk  Also allergic to Elliquis -> rash  EKG showed A. Fib  Resumed home metoprolol - 12.5 -> 25 twice daily  CHF   home meds metoprolol, torsemide, aldactone  TTE EF > 75% with sign of fluid overload  Give lasix 20mg  IV once  Resumed torsemide   Stopped IVF  CXR worsening basilar opacities (L>R). increasing moderate L and small R pleural effusions. Cardiomegaly w/ possible CHF.  AKI on CKD IV  Creatinine - 170->1.80->1.71->2.09->2.58  Hypotension  BP on lower side   Resumed home metoprolol and torsemide  Hold off spironolactone  Hyperlipidemia  Home Lipid lowering medication: Lipitor 20 mg  daily  LDL 36, goal < 70  Resumed Lipitor 20  Dysphagia  Not able to pass swallow  Treated with tube feedings  Other Stroke Risk Factors  Advanced age  Hx stroke - by imaging  Other Active Problems  Thrombocytopenia [phone]    Stage I R coccyx pressure ulcer, present on admission   DEATH  Code status - DNR/DNI   Date of death:  10-23-19  Time of death:  45  25 minutes were spent preparing discharge.  Rosalin Hawking, MD PhD Stroke Neurology 10/10/2019 6:55 PM

## 2019-10-14 NOTE — Progress Notes (Signed)
OT Cancellation Note  Patient Details Name: Cristina [name] MRN: [id number] DOB: Dec 28, 1923   Cancelled Treatment:    Reason Eval/Treat Not Completed: Medical issues which prohibited therapy(Pt with Sp02 of 87% on NRB, HR 133 at rest.)  [name] October 20, 2019, 9:42 AM  Nestor Lewandowsky, OTR/L Acute Rehabilitation Services Pager: [phone] Office: [phone]

## 2019-10-14 NOTE — Progress Notes (Signed)
Called pt's daughter at [phone] to inform her of pt's respiratory and neuro changes. Awaiting call back to make any status changes.

## 2019-10-14 NOTE — Progress Notes (Signed)
Pt expired peacefully surrounded by her two sons at 66. Lorriane Shire, RN, accompanied this RN to pronounce death. Morphine gtt and tube feedings turned off upon pronunciation. MD made aware. Comfort provided to pt's sons.

## 2019-10-14 NOTE — Plan of Care (Signed)
Pt continues to be lethargic, but arousable by voice.  Pt not opening eyes to command or voice; will follow simple commands with rt hand/arm and answer simple questions with nod or shake of head.    Sats earlier in shift had trended down to 86% on 5L [place].  Discussed with RT and changed pt to face mask.  Currently on NRB Mask at 15L.    Problem: Education: Goal: Knowledge of disease or condition will improve Outcome: Not Progressing Goal: Knowledge of secondary prevention will improve Outcome: Not Progressing   Problem: Coping: Goal: Will verbalize positive feelings about self Outcome: Not Progressing Goal: Will identify appropriate support needs Outcome: Not Progressing   Problem: Health Behavior/Discharge Planning: Goal: Ability to manage health-related needs will improve Outcome: Not Progressing   Problem: Self-Care: Goal: Ability to participate in self-care as condition permits will improve Outcome: Not Progressing   Problem: Health Behavior/Discharge Planning: Goal: Ability to manage health-related needs will improve Outcome: Not Progressing   Problem: Clinical Measurements: Goal: Ability to maintain clinical measurements within normal limits will improve Outcome: Not Progressing Goal: Will remain free from infection Outcome: Not Progressing Goal: Diagnostic test results will improve Outcome: Not Progressing Goal: Cardiovascular complication will be avoided Outcome: Not Progressing   Problem: Activity: Goal: Risk for activity intolerance will decrease Outcome: Not Progressing   Problem: Elimination: Goal: Will not experience complications related to bowel motility Outcome: Not Progressing Goal: Will not experience complications related to urinary retention Outcome: Not Progressing   Problem: Pain Managment: Goal: General experience of comfort will improve Outcome: Not Progressing   Problem: Safety: Goal: Ability to remain free from injury will improve Outcome:  Not Progressing   Problem: Skin Integrity: Goal: Risk for impaired skin integrity will decrease Outcome: Not Progressing

## 2019-10-14 DEATH — deceased
